# Patient Record
Sex: Male | Born: 1940
Health system: Southern US, Community
[De-identification: ages and names within clinical notes are randomized; demographics above are authoritative.]

## PROBLEM LIST (undated history)

## (undated) DIAGNOSIS — E1169 Type 2 diabetes mellitus with other specified complication: Secondary | ICD-10-CM

## (undated) DIAGNOSIS — E1122 Type 2 diabetes mellitus with diabetic chronic kidney disease: Secondary | ICD-10-CM

## (undated) DIAGNOSIS — E785 Hyperlipidemia, unspecified: Secondary | ICD-10-CM

## (undated) DIAGNOSIS — N182 Chronic kidney disease, stage 2 (mild): Secondary | ICD-10-CM

## (undated) DIAGNOSIS — E119 Type 2 diabetes mellitus without complications: Secondary | ICD-10-CM

## (undated) DIAGNOSIS — M109 Gout, unspecified: Secondary | ICD-10-CM

## (undated) DIAGNOSIS — E559 Vitamin D deficiency, unspecified: Secondary | ICD-10-CM

## (undated) DIAGNOSIS — D126 Benign neoplasm of colon, unspecified: Secondary | ICD-10-CM

## (undated) DIAGNOSIS — I10 Essential (primary) hypertension: Secondary | ICD-10-CM

## (undated) DIAGNOSIS — C61 Malignant neoplasm of prostate: Secondary | ICD-10-CM

## (undated) HISTORY — DX: Malignant neoplasm of prostate: C61

## (undated) HISTORY — DX: Benign neoplasm of colon, unspecified: D12.6

## (undated) HISTORY — DX: Chronic kidney disease, stage 2 (mild): N18.2

## (undated) HISTORY — DX: Type 2 diabetes mellitus without complications: E11.9

## (undated) HISTORY — DX: Type 2 diabetes mellitus with diabetic chronic kidney disease: E11.22

## (undated) HISTORY — DX: Gout, unspecified: M10.9

## (undated) HISTORY — DX: Type 2 diabetes mellitus with other specified complication: E11.69

## (undated) HISTORY — DX: Essential (primary) hypertension: I10

## (undated) HISTORY — DX: Vitamin D deficiency, unspecified: E55.9

## (undated) HISTORY — DX: Hyperlipidemia, unspecified: E78.5

---

## 1992-07-17 HISTORY — PX: APPENDECTOMY: SHX54

## 1992-07-17 HISTORY — PX: CHOLECYSTECTOMY: SHX55

## 1994-07-17 HISTORY — PX: KNEE SURGERY: SHX244

## 1998-12-23 ENCOUNTER — Encounter: Payer: Self-pay | Admitting: Internal Medicine

## 1998-12-23 ENCOUNTER — Ambulatory Visit (HOSPITAL_COMMUNITY): Admission: RE | Admit: 1998-12-23 | Discharge: 1998-12-23 | Payer: Self-pay | Admitting: Internal Medicine

## 1999-03-23 ENCOUNTER — Encounter (INDEPENDENT_AMBULATORY_CARE_PROVIDER_SITE_OTHER): Payer: Self-pay | Admitting: Specialist

## 1999-03-23 ENCOUNTER — Other Ambulatory Visit: Admission: RE | Admit: 1999-03-23 | Discharge: 1999-03-23 | Payer: Self-pay | Admitting: Gastroenterology

## 2002-03-18 ENCOUNTER — Encounter: Payer: Self-pay | Admitting: Gastroenterology

## 2002-03-26 ENCOUNTER — Encounter: Payer: Self-pay | Admitting: Gastroenterology

## 2003-04-22 ENCOUNTER — Encounter: Payer: Self-pay | Admitting: Gastroenterology

## 2006-06-19 ENCOUNTER — Ambulatory Visit (HOSPITAL_COMMUNITY): Admission: RE | Admit: 2006-06-19 | Discharge: 2006-06-19 | Payer: Self-pay | Admitting: Internal Medicine

## 2007-07-24 ENCOUNTER — Encounter: Payer: Self-pay | Admitting: Gastroenterology

## 2007-11-15 ENCOUNTER — Ambulatory Visit: Admission: RE | Admit: 2007-11-15 | Discharge: 2008-02-13 | Payer: Self-pay | Admitting: Radiation Oncology

## 2008-02-14 ENCOUNTER — Ambulatory Visit: Admission: RE | Admit: 2008-02-14 | Discharge: 2008-04-23 | Payer: Self-pay | Admitting: Radiation Oncology

## 2008-07-17 DIAGNOSIS — C61 Malignant neoplasm of prostate: Secondary | ICD-10-CM

## 2008-07-17 HISTORY — DX: Malignant neoplasm of prostate: C61

## 2008-09-08 ENCOUNTER — Encounter: Payer: Self-pay | Admitting: Gastroenterology

## 2009-03-03 ENCOUNTER — Encounter: Payer: Self-pay | Admitting: Gastroenterology

## 2009-05-03 ENCOUNTER — Encounter: Admission: RE | Admit: 2009-05-03 | Discharge: 2009-05-03 | Payer: Self-pay | Admitting: Internal Medicine

## 2009-05-03 ENCOUNTER — Encounter: Payer: Self-pay | Admitting: Gastroenterology

## 2009-05-03 ENCOUNTER — Encounter (INDEPENDENT_AMBULATORY_CARE_PROVIDER_SITE_OTHER): Payer: Self-pay | Admitting: *Deleted

## 2009-05-06 ENCOUNTER — Encounter: Admission: RE | Admit: 2009-05-06 | Discharge: 2009-05-06 | Payer: Self-pay | Admitting: Internal Medicine

## 2009-05-06 ENCOUNTER — Encounter: Payer: Self-pay | Admitting: Gastroenterology

## 2009-06-01 ENCOUNTER — Ambulatory Visit: Payer: Self-pay | Admitting: Gastroenterology

## 2009-06-03 ENCOUNTER — Ambulatory Visit: Payer: Self-pay | Admitting: Gastroenterology

## 2009-06-09 ENCOUNTER — Encounter: Payer: Self-pay | Admitting: Gastroenterology

## 2009-06-24 ENCOUNTER — Encounter: Payer: Self-pay | Admitting: Gastroenterology

## 2009-07-29 ENCOUNTER — Encounter: Payer: Self-pay | Admitting: Gastroenterology

## 2009-09-16 ENCOUNTER — Ambulatory Visit: Payer: Self-pay | Admitting: Gastroenterology

## 2009-09-16 DIAGNOSIS — Z86010 Personal history of colonic polyps: Secondary | ICD-10-CM

## 2009-09-22 ENCOUNTER — Ambulatory Visit: Payer: Self-pay | Admitting: Gastroenterology

## 2009-09-24 ENCOUNTER — Encounter: Payer: Self-pay | Admitting: Gastroenterology

## 2009-10-04 ENCOUNTER — Telehealth: Payer: Self-pay | Admitting: Gastroenterology

## 2009-10-04 ENCOUNTER — Ambulatory Visit: Payer: Self-pay | Admitting: Gastroenterology

## 2009-10-04 LAB — CONVERTED CEMR LAB: Fecal Occult Bld: POSITIVE

## 2009-10-05 ENCOUNTER — Ambulatory Visit: Payer: Self-pay | Admitting: Gastroenterology

## 2009-10-05 LAB — CONVERTED CEMR LAB: OCCULT 4: NEGATIVE

## 2009-10-08 ENCOUNTER — Encounter: Payer: Self-pay | Admitting: Gastroenterology

## 2009-10-13 ENCOUNTER — Ambulatory Visit: Payer: Self-pay | Admitting: Gastroenterology

## 2009-11-10 ENCOUNTER — Telehealth: Payer: Self-pay | Admitting: Gastroenterology

## 2009-11-12 ENCOUNTER — Ambulatory Visit: Payer: Self-pay | Admitting: Gastroenterology

## 2009-11-12 LAB — CONVERTED CEMR LAB
Basophils Relative: 0.3 % (ref 0.0–3.0)
Eosinophils Relative: 3.6 % (ref 0.0–5.0)
HCT: 35.5 % — ABNORMAL LOW (ref 39.0–52.0)
MCV: 78.5 fL (ref 78.0–100.0)
Monocytes Absolute: 0.5 10*3/uL (ref 0.1–1.0)
Neutrophils Relative %: 59.9 % (ref 43.0–77.0)
RBC: 4.52 M/uL (ref 4.22–5.81)
WBC: 5.7 10*3/uL (ref 4.5–10.5)

## 2009-11-15 ENCOUNTER — Ambulatory Visit: Payer: Self-pay | Admitting: Gastroenterology

## 2010-01-13 ENCOUNTER — Encounter: Payer: Self-pay | Admitting: Gastroenterology

## 2010-08-07 ENCOUNTER — Encounter: Payer: Self-pay | Admitting: Internal Medicine

## 2010-08-18 NOTE — Procedures (Signed)
Summary: Instruction for procedure/[facility]  Instruction for procedure/[facility]   Imported By: Sherian Rein 10/07/2009 08:20:25  _____________________________________________________________________  External Attachment:    Type:   Image     Comment:   External Document

## 2010-08-18 NOTE — Assessment & Plan Note (Signed)
Summary: low iron...em   History of Present Illness Visit Type: consult  Primary GI MD: Melvia Heaps MD [facility] Primary Provider: Laneta Simmers, MD Requesting Provider: Lucky Cowboy, MD Chief Complaint: Low iron. Pt c/o muscle pain and shortness of breath  History of Present Illness:   [name] has returned for evaluation of an iron deficiency anemia.  On July 30, 2009 hemoglobin was 11.8 and MCV 78.7.  His percent saturation was 17.  He has no GI complaints of abdominal pain, change in bowel habits, melena or hematochezia.  Colonoscopy in November, 2010 demonstrated resolving diverticulitis and a adenomatous polyp which was removed.  He does take Naprosyn about every other day   GI Review of Systems      Denies abdominal pain, acid reflux, belching, bloating, chest pain, dysphagia with liquids, dysphagia with solids, heartburn, loss of appetite, nausea, vomiting, vomiting blood, weight loss, and  weight gain.        Denies anal fissure, black tarry stools, change in bowel habit, constipation, diarrhea, diverticulosis, fecal incontinence, heme positive stool, hemorrhoids, irritable bowel syndrome, jaundice, light color stool, liver problems, rectal bleeding, and  rectal pain.    Current Medications (verified): 1)  Atenolol 100 Mg Tabs (Atenolol) .Marland Kitchen.. 1 By Mouth Once Daily 2)  Allopurinol 300 Mg Tabs (Allopurinol) .... 1/2 By Mouth Once Daily 3)  Doxazosin Mesylate 8 Mg Tabs (Doxazosin Mesylate) .... 1/2 By Mouth Once Daily 4)  Metformin Hcl 500 Mg Tabs (Metformin Hcl) .... Take One By Mouth Two Times A Day 5)  Crestor 40 Mg Tabs (Rosuvastatin Calcium) .... One Tablet By Mouth Once Daily 6)  Bumetanide 2 Mg Tabs (Bumetanide) .... Take One By Mouth Once Daily 7)  Naprosyn 500 Mg Tabs (Naproxen) .... Take Two By Mouth As Needed 8)  Alprazolam 1 Mg Tabs (Alprazolam) .... Take 1/2 Tablet Three Times A Day As Needed 9)  Vitamin D3 10000 Unit Caps (Cholecalciferol) .... Take One By  Mouth Once Daily 10)  Fish Oil 300 Mg Caps (Omega-3 Fatty Acids) .... Take One By Mouth Once Daily 11)  Cal/mag Citrate 250-125 Mg Tabs (Calcium-Magnesium) .... Take One By Mouth Once Daily  Allergies (verified): No Known Drug Allergies  Past History:  Past Medical History: Reviewed history from 06/01/2009 and no changes required. GERD Hypertension chronic venous insufficiency DJD gout Obesity obstructive BPH Hyperlipidemia Hx of Colon Polyps  Diverticulosis  2003 Hx prostate cancer  Past Surgical History: Reviewed history from 05/26/2009 and no changes required. Appendectomy 1994 Cholecystectomy 1994 bilateral quadriceps tendon repair  Family History: Reviewed history from 06/01/2009 and no changes required. Father died age 12 Fever of Unknown origin Mother died at age 67 with Rhuematic Heart Disease Neice cancer unknown type No FH of Colon Cancer Family History of Diabetes: brother  Social History: Retired divorced Patient is a former smoker.  Alcohol Use - no Illicit Drug Use - no  Review of Systems       The patient complains of muscle pains/cramps and shortness of breath.  The patient denies allergy/sinus, anemia, anxiety-new, arthritis/joint pain, back pain, blood in urine, breast changes/lumps, change in vision, confusion, cough, coughing up blood, depression-new, fainting, fatigue, fever, headaches-new, hearing problems, heart murmur, heart rhythm changes, itching, night sweats, nosebleeds, skin rash, sleeping problems, sore throat, swelling of feet/legs, swollen lymph glands, thirst - excessive, urination - excessive, urination changes/pain, urine leakage, vision changes, and voice change.    Vital Signs:  Patient profile:   70 year old male Height:  73.5 inches Weight:      311 pounds BMI:     40.62 BSA:     2.61 Pulse rate:   68 / minute Pulse rhythm:   regular BP sitting:   124 / 72  (left arm) Cuff size:   regular  Vitals Entered By: Ok Anis CMA (September 16, 2009 9:03 AM)   Impression & Recommendations:  Problem # 1:  IRON DEFICIENCY (ICD-280.9)  His iron deficiency anemia may be related to his NSAID use causing erosions or frank ulcers.  Blood loss from bleeding AVMs is another consideration.  A colonic bleeding source is very unlikely in the face of his recent colonoscopy.  Recommendations #1 serial Hemoccults #2 upper endoscopy  Orders: EGD (EGD)  Problem # 2:  PERSONAL HISTORY OF COLONIC POLYPS (ICD-V12.72) Plan followup colonoscopy in 5 years  Patient Instructions: 1)  cc Lucky Cowboy, M.D. 2)  Your EGD is scheduled for 09/22/2009 at 10am 3)  You need to go to the basement today for labs 4)  The medication list was reviewed and reconciled.  All changed / newly prescribed medications were explained.  A complete medication list was provided to the patient / caregiver.

## 2010-08-18 NOTE — Letter (Signed)
Summary: Results Letter  [facility]  [address] [place], [place] [address]   Phone: [phone]  Fax: [phone]        October 05, 2009 MRN: [id number]    Marcus Chambers [address] [place], [place]  [address]    Dear Mr. Dantes,  Your hemeoccult cards testing for microscopic bleeding were negative.  No further GI workup is required at this time.   Please continue with the recommendations that we previously discussed. Should you have any further questions or concerns, feel free to contact me.    Sincerely,  Barbette Hair. Arlyce Dice, M.D., [facility]  cc Lucky Cowboy, M.D.         Sincerely,  Louis Meckel MD  This letter has been electronically signed by your physician.  Appended Document: Results Letter letter mailed

## 2010-08-18 NOTE — Letter (Signed)
Summary: [facility]: Immunoassay Fecal Occult Blood (iFOB) Order [facility]  [address] [place], [place] [address]   Phone: [phone]  Fax: [phone]      [facility]: Immunoassay Fecal Occult Blood (iFOB) Order Form   September 16, 2009 MRN: [id number]   Marcus Chambers 08-Aug-1940   Physicican Name:robert kaplan Diagnosis Code:280.9  anemia      [name]

## 2010-08-18 NOTE — Progress Notes (Signed)
Summary: Capsule results  Phone Note Call from Patient Call back at Home Phone [phone] Call back at [phone]   Caller: Patient Summary of Call: Patient had capsule endo on 10/13/2009 but has not heard about the results.  Initial call taken by: Harlow Mares CMA Duncan Dull),  November 10, 2009 8:54 AM  Follow-up for Phone Call        No abnormalities.  Check with Amy for recommendations Follow-up by: Louis Meckel MD,  November 10, 2009 9:54 AM  Additional Follow-up for Phone Call Additional follow up Details #1::        Per Amy Esterwood PAC: Pt. needs a CBC and OV to follow-up.  Pt. will have labs drawn 11-12-09 and see Dr.Kaplan on 11-15-09 at 2:45pm. Pt. instructed to call back as needed.  Additional Follow-up by: Laureen Ochs LPN,  November 10, 2009 3:00 PM

## 2010-08-18 NOTE — Letter (Signed)
Summary: Diabetic Instructions   Gastroenterology  [address] [place], [place] [address]   Phone: [phone]  Fax: [phone]    Marcus Chambers 1941/06/18 MRN: [id number]   X    ORAL DIABETIC MEDICATION INSTRUCTIONS  (Metformin)  The day before your procedure:   Take your diabetic pill as you do normally  The day of your procedure:   Do not take your diabetic pill    We will check your blood sugar levels during the admission process and again in Recovery before discharging you home  ________________________________________________________________________  _  _   INSULIN (LONG ACTING) MEDICATION INSTRUCTIONS (Lantus, NPH, 70/30, Humulin, Novolin-N)   The day before your procedure:   Take  your regular evening dose    The day of your procedure:   Do not take your morning dose    _  _   INSULIN (SHORT ACTING) MEDICATION INSTRUCTIONS (Regular, Humulog, Novolog)   The day before your procedure:   Do not take your evening dose   The day of your procedure:   Do not take your morning dose   _  _   INSULIN PUMP MEDICATION INSTRUCTIONS  We will contact the physician managing your diabetic care for written dosage instructions for the day before your procedure and the day of your procedure.  Once we have received the instructions, we will contact you.

## 2010-08-18 NOTE — Letter (Signed)
Summary: EGD Instructions  [facility]  [address] [place], [place] [address]   Phone: [phone]  Fax: [phone]       Marcus Chambers    09-15-1940    MRN: [id number]       Procedure Day /Date:WEDNESDAY 09/22/2009     Arrival Time: 9AM     Procedure Time:10AM     Location of Procedure:                    X  [facility] (4th Floor)   PREPARATION FOR ENDOSCOPY   On 09/22/2009  THE DAY OF THE PROCEDURE:  1.   No solid foods, milk or milk products are allowed after midnight the night before your procedure.  2.   Do not drink anything colored red or purple.  Avoid juices with pulp.  No orange juice.  3.  You may drink clear liquids until 8AM , which is 2 hours before your procedure.                                                                                                CLEAR LIQUIDS INCLUDE: Water Jello Ice Popsicles Tea (sugar ok, no milk/cream) Powdered fruit flavored drinks Coffee (sugar ok, no milk/cream) Gatorade Juice: apple, white grape, white cranberry  Lemonade Clear bullion, consomm, broth Carbonated beverages (any kind) Strained chicken noodle soup Hard Candy   MEDICATION INSTRUCTIONS  Unless otherwise instructed, you should take regular prescription medications with a small sip of water as early as possible the morning of your procedure.            OTHER INSTRUCTIONS  You will need a responsible adult at least 70 years of age to accompany you and drive you home.   This person must remain in the waiting room during your procedure.  Wear loose fitting clothing that is easily removed.  Leave jewelry and other valuables at home.  However, you may wish to bring a book to read or an iPod/MP3 player to listen to music as you wait for your procedure to start.  Remove all body piercing jewelry and leave at home.  Total time from sign-in until discharge is approximately 2-3 hours.  You should go home directly after  your procedure and rest.  You can resume normal activities the day after your procedure.  The day of your procedure you should not:   Drive   Make legal decisions   Operate machinery   Drink alcohol   Return to work  You will receive specific instructions about eating, activities and medications before you leave.    The above instructions have been reviewed and explained to me by   _______________________    I fully understand and can verbalize these instructions _____________________________ Date _________

## 2010-08-18 NOTE — Progress Notes (Signed)
Summary: Capsule Endo. Scheduled  Phone Note Outgoing Call   Call placed by: Laureen Ochs LPN,  October 04, 2009 4:25 PM Call placed to: Patient Summary of Call: Occult blood results/per Dr.Kaplan--Pt. needs a Capsule Endoscopy. Pt. has scheduled the appt. for 10-13-09 at 8am. Instructions reviewed with Mr.Clayborn by phone and mailed to him. Pt. instructed to call back as needed.   Initial call taken by: Laureen Ochs LPN,  October 04, 2009 4:26 PM  New Problems: HEMOCCULT POSITIVE STOOL (ICD-578.1) IRON DEFICIENCY ANEMIA SECONDARY TO BLOOD LOSS (ICD-280.0)   New Problems: HEMOCCULT POSITIVE STOOL (ICD-578.1) IRON DEFICIENCY ANEMIA SECONDARY TO BLOOD LOSS (ICD-280.0)

## 2010-08-18 NOTE — Miscellaneous (Signed)
Summary: Capsule Endoscopy Scheduling Form/Marcus Chambers  Capsule Endoscopy Scheduling Form/Marcus Chambers   Imported By: Sherian Rein 10/19/2009 08:12:44  _____________________________________________________________________  External Attachment:    Type:   Image     Comment:   External Document

## 2010-08-18 NOTE — Letter (Signed)
Summary: Results Letter  [facility]  [address] [place], [place] [address]   Phone: [phone]  Fax: [phone]        September 24, 2009 MRN: [id number]    [name] [address] [place], [place]  [address]    Dear Mr. Natzke,  Your biopsy results did not show any remarkable findings.  Please continue with the recommendations previously discussed. No further GI workup is required at this time.  Should you have any further questions or immediate concers, feel free to contact me.  Sincerely,  Barbette Hair. Arlyce Dice, M.D., [facility]          Sincerely,  Louis Meckel MD  This letter has been electronically signed by your physician.  Appended Document: Results Letter Letter mailed 3.15.11

## 2010-08-18 NOTE — Procedures (Signed)
Summary: IRON DEF. ANEMIA, BLOOD IN [facility]  Patient here today for capsule endoscopy for Dr. Arlyce Dice .  Pt verbalized understanding of all verbal and written instructions.  Pt tolerated well.  Lot #  [id number]  exp _2012-05 .

## 2010-08-18 NOTE — Assessment & Plan Note (Signed)
Summary: POST CAPSULE ENDO. & LABS          Marcus Chambers   History of Present Illness Visit Type: Follow-up Visit Primary GI MD: Melvia Heaps MD [facility] Primary Provider: Laneta Simmers, MD Requesting Provider: n/a Chief Complaint: F/u for capsule endo and labs. Pt states that he is fine and denies any GI complaints   History of Present Illness:   Marcus Chambers has returned for followup of his anemia.  Upper endoscopy and capsule endoscopy were not diagnostic for a GI bleeding source.  A single Hemoccult was positive in March, 2011.  Followup Hemoccults several days later were negative x6.  He does take Naprosyn about twice a week.  His last hemoglobin was 11.6 on April 29 which is stable.  MCV remains low at 78.5.  He has no GI complaints.   GI Review of Systems      Denies abdominal pain, acid reflux, belching, bloating, chest pain, dysphagia with liquids, dysphagia with solids, heartburn, loss of appetite, nausea, vomiting, vomiting blood, weight loss, and  weight gain.        Denies anal fissure, black tarry stools, change in bowel habit, constipation, diarrhea, diverticulosis, fecal incontinence, heme positive stool, hemorrhoids, irritable bowel syndrome, jaundice, light color stool, liver problems, rectal bleeding, and  rectal pain.    Current Medications (verified): 1)  Atenolol 100 Mg Tabs (Atenolol) .Marland Kitchen.. 1 By Mouth Once Daily 2)  Allopurinol 300 Mg Tabs (Allopurinol) .... 1/2 By Mouth Once Daily 3)  Doxazosin Mesylate 8 Mg Tabs (Doxazosin Mesylate) .... 1/2 By Mouth Once Daily 4)  Metformin Hcl 500 Mg Tabs (Metformin Hcl) .... Take One By Mouth Two Times A Day 5)  Bumetanide 2 Mg Tabs (Bumetanide) .... Take One By Mouth Once Daily 6)  Naprosyn 500 Mg Tabs (Naproxen) .... Take Two By Mouth As Needed 7)  Alprazolam 1 Mg Tabs (Alprazolam) .... Take 1/2 Tablet Three Times A Day As Needed 8)  Vitamin D3 10000 Unit Caps (Cholecalciferol) .... Take One By Mouth Once Daily 9)  Fish Oil 300 Mg  Caps (Omega-3 Fatty Acids) .... Take One By Mouth Once Daily 10)  Pravastatin Sodium 40 Mg Tabs (Pravastatin Sodium) .... One Tablet By Mouth Once Daily  Allergies (verified): No Known Drug Allergies  Past History:  Past Medical History: Reviewed history from 06/01/2009 and no changes required. GERD Hypertension chronic venous insufficiency DJD gout Obesity obstructive BPH Hyperlipidemia Hx of Colon Polyps  Diverticulosis  2003 Hx prostate cancer  Past Surgical History: Reviewed history from 05/26/2009 and no changes required. Appendectomy 1994 Cholecystectomy 1994 bilateral quadriceps tendon repair  Family History: Reviewed history from 06/01/2009 and no changes required. Father died age 42 Fever of Unknown origin Mother died at age 81 with Rhuematic Heart Disease Neice cancer unknown type No FH of Colon Cancer Family History of Diabetes: Brother  Social History: Reviewed history from 09/16/2009 and no changes required. Retired divorced Patient is a former smoker.  Alcohol Use - no Illicit Drug Use - no  Review of Systems       The patient complains of arthritis/joint pain.  The patient denies allergy/sinus, anemia, anxiety-new, back pain, blood in urine, breast changes/lumps, change in vision, confusion, cough, coughing up blood, depression-new, fainting, fatigue, fever, headaches-new, hearing problems, heart murmur, heart rhythm changes, itching, muscle pains/cramps, night sweats, nosebleeds, shortness of breath, skin rash, sleeping problems, sore throat, swelling of feet/legs, swollen lymph glands, thirst - excessive, urination - excessive, urination changes/pain, urine leakage, vision changes, and  voice change.    Vital Signs:  Patient profile:   70 year old male Height:      73.5 inches Weight:      307 pounds BMI:     40.10 BSA:     2.60 Pulse rate:   64 / minute Pulse rhythm:   regular BP sitting:   126 / 78  (left arm) Cuff size:   regular  Vitals  Entered By: Ok Anis CMA (Nov 15, 2009 2:43 PM)   Impression & Recommendations:  Problem # 1:  IRON DEFICIENCY ANEMIA SECONDARY TO BLOOD LOSS (ICD-280.0) This may be related to his NSAID use.  No other obvious lesions were identified by endoscopy, capsule endoscopy or colonoscopy.  Recommendations #1 iron supplementation for one month #2 no further GI studies at this time  #3 followup CBC in approximately a month.  He will get that at Dr. Kathryne Sharper office  Patient Instructions: 1)  Copy sent to : William Mckowen,MD 2)  You can pick up your Iron prescription from your pharmacy today 3)  The medication list was reviewed and reconciled.  All changed / newly prescribed medications were explained.  A complete medication list was provided to the patient / caregiver. Prescriptions: FEOSOL 200 (65 FE) MG TABS (FERROUS SULFATE DRIED) take one tab twice a day  #60 x 0   Entered and Authorized by:   Louis Meckel MD   Signed by:   Louis Meckel MD on 11/15/2009   Method used:   Electronically to        [facility] (retail)       [address]       [place], [place]  [address]       Ph: [phone]       Fax: [phone]   RxID:   [id number]

## 2010-08-18 NOTE — Procedures (Signed)
Summary: Capsule Endoscopy   Capsule Endoscopy  Procedure date:  10/13/2009  Findings:      Performing Location: [facility]   Ordering Physician: Melvia Heaps, MD  Report created/read by: Melvia Heaps, MD  Reason for Referral:  70 y/o male with Fe deficiency anemia.  Recent EGD and colonoscopy unrevealing as to source  Procedure Information and Findings:  1) Complete study, good prep 2) Few tiny red spots, ? AVMs 3) lymphoid hyperplasia in distal illeum  Summary and Recommendations:  No definite findings to explain iron deficiency, other than possible tiny AVM's.    This report was created from the original report, which was reviewed and signed by the above listed reading physician.

## 2010-08-18 NOTE — Procedures (Signed)
Summary: Upper Endoscopy  Patient: Marcus Chambers Note: All result statuses are Final unless otherwise noted.  Tests: (1) Upper Endoscopy (EGD)   EGD Upper Endoscopy       DONE     [facility]     [address]     [place], [place]  [address]           ENDOSCOPY PROCEDURE REPORT           PATIENT:  Marcus Chambers, Marcus Chambers  MR#:  [id number]     BIRTHDATE:  09-23-1940, 68 yrs. old  GENDER:  male           ENDOSCOPIST:  Barbette Hair. Arlyce Dice, MD     Referred by:           PROCEDURE DATE:  09/22/2009     PROCEDURE:  EGD with biopsy     ASA CLASS:  Class II     INDICATIONS:  iron deficiency anemia           MEDICATIONS:   Fentanyl 100 mcg IV, Versed 9 mg IV, glycopyrrolate     (Robinal) 0.2 mg IV, 0.6cc simethancone 0.6 cc PO     TOPICAL ANESTHETIC:  Exactacain Spray           DESCRIPTION OF PROCEDURE:   After the risks benefits and     alternatives of the procedure were thoroughly explained, informed     consent was obtained.  The LB [id number] endoscope was     introduced through the mouth and advanced to the third portion of     the duodenum, without limitations.  The instrument was slowly     withdrawn as the mucosa was fully examined.     <<PROCEDUREIMAGES>>           Mild gastritis was found in the body of the stomach. Mild     nonerosive gastritis. Bxs taken (see image6).  Otherwise the     examination was normal (see image1, image2, image3, image4,     image5, image7, image10, and image11).    Retroflexed views     revealed no abnormalities.    The scope was then withdrawn from     the patient and the procedure completed.           COMPLICATIONS:  None           ENDOSCOPIC IMPRESSION:     1) Mild gastritis in the body of the stomach     2) Otherwise normal examination           Gastritis probably is secondary to naprosyn use.  No source for GI     bleeding identified.           RECOMMENDATIONS:     1) hemmoccult stools in 1 week; if negative, no further GI  workup           REPEAT EXAM:  No           ______________________________     Barbette Hair. Arlyce Dice, MD           CC:  Lucky Cowboy, MD           n.     Rosalie Doctor:   Barbette Hair. Kaplan at 09/22/2009 11:00 AM           Charissa Bash, [phone]  Note: An exclamation mark (!) indicates a result that was not dispersed into the flowsheet. Document Creation Date: 09/22/2009 11:00 AM _______________________________________________________________________  (1) Order result  status: Final Collection or observation date-time: 09/22/2009 10:55 Requested date-time:  Receipt date-time:  Reported date-time:  Referring Physician:   Ordering Physician: Melvia Heaps [phone]) Specimen Source:  Source: Launa Grill Order Number: [id number] Lab site:

## 2010-10-07 ENCOUNTER — Other Ambulatory Visit: Payer: Self-pay | Admitting: Surgery

## 2010-10-07 DIAGNOSIS — L988 Other specified disorders of the skin and subcutaneous tissue: Secondary | ICD-10-CM

## 2010-10-14 ENCOUNTER — Ambulatory Visit
Admission: RE | Admit: 2010-10-14 | Discharge: 2010-10-14 | Disposition: A | Discharge status: Home / Self Care | Payer: Medicare Other | Source: Ambulatory Visit | Attending: Surgery | Admitting: Surgery

## 2010-10-14 ENCOUNTER — Other Ambulatory Visit: Payer: Self-pay | Admitting: Surgery

## 2010-10-14 DIAGNOSIS — L988 Other specified disorders of the skin and subcutaneous tissue: Secondary | ICD-10-CM

## 2010-10-19 LAB — GLUCOSE, CAPILLARY: Glucose-Capillary: 161 mg/dL — ABNORMAL HIGH (ref 70–99)

## 2010-11-15 ENCOUNTER — Ambulatory Visit (HOSPITAL_COMMUNITY)
Admission: RE | Admit: 2010-11-15 | Discharge: 2010-11-15 | Disposition: A | Discharge status: Home / Self Care | Payer: Medicare Other | Source: Ambulatory Visit | Attending: Surgery | Admitting: Surgery

## 2010-11-15 ENCOUNTER — Other Ambulatory Visit (HOSPITAL_COMMUNITY): Payer: Self-pay | Admitting: Surgery

## 2010-11-15 ENCOUNTER — Encounter (HOSPITAL_COMMUNITY): Payer: Medicare Other

## 2010-11-15 ENCOUNTER — Other Ambulatory Visit: Payer: Self-pay | Admitting: Surgery

## 2010-11-15 DIAGNOSIS — Z01812 Encounter for preprocedural laboratory examination: Secondary | ICD-10-CM | POA: Insufficient documentation

## 2010-11-15 DIAGNOSIS — Z01818 Encounter for other preprocedural examination: Secondary | ICD-10-CM | POA: Insufficient documentation

## 2010-11-15 DIAGNOSIS — N321 Vesicointestinal fistula: Secondary | ICD-10-CM | POA: Insufficient documentation

## 2010-11-15 LAB — CBC
HCT: 41.3 % (ref 39.0–52.0)
Hemoglobin: 13.2 g/dL (ref 13.0–17.0)
MCHC: 32 g/dL (ref 30.0–36.0)
MCV: 81 fL (ref 78.0–100.0)

## 2010-11-15 LAB — BASIC METABOLIC PANEL
CO2: 26 mEq/L (ref 19–32)
Calcium: 10.3 mg/dL (ref 8.4–10.5)
Glucose, Bld: 104 mg/dL — ABNORMAL HIGH (ref 70–99)
Sodium: 139 mEq/L (ref 135–145)

## 2010-11-21 ENCOUNTER — Encounter (INDEPENDENT_AMBULATORY_CARE_PROVIDER_SITE_OTHER): Payer: Self-pay | Admitting: Surgery

## 2010-11-23 ENCOUNTER — Inpatient Hospital Stay (HOSPITAL_COMMUNITY)
Admission: RE | Admit: 2010-11-23 | Discharge: 2010-12-07 | DRG: 653 | Disposition: A | Discharge status: Home / Self Care | Payer: Medicare Other | Source: Ambulatory Visit | Attending: Surgery | Admitting: Surgery

## 2010-11-23 ENCOUNTER — Inpatient Hospital Stay (HOSPITAL_COMMUNITY): Payer: Medicare Other

## 2010-11-23 ENCOUNTER — Other Ambulatory Visit: Payer: Self-pay | Admitting: Surgery

## 2010-11-23 DIAGNOSIS — I10 Essential (primary) hypertension: Secondary | ICD-10-CM | POA: Diagnosis present

## 2010-11-23 DIAGNOSIS — I4949 Other premature depolarization: Secondary | ICD-10-CM | POA: Diagnosis not present

## 2010-11-23 DIAGNOSIS — Z01818 Encounter for other preprocedural examination: Secondary | ICD-10-CM

## 2010-11-23 DIAGNOSIS — Y832 Surgical operation with anastomosis, bypass or graft as the cause of abnormal reaction of the patient, or of later complication, without mention of misadventure at the time of the procedure: Secondary | ICD-10-CM | POA: Diagnosis not present

## 2010-11-23 DIAGNOSIS — I428 Other cardiomyopathies: Secondary | ICD-10-CM | POA: Diagnosis present

## 2010-11-23 DIAGNOSIS — Z79899 Other long term (current) drug therapy: Secondary | ICD-10-CM

## 2010-11-23 DIAGNOSIS — K651 Peritoneal abscess: Secondary | ICD-10-CM | POA: Diagnosis present

## 2010-11-23 DIAGNOSIS — Z01812 Encounter for preprocedural laboratory examination: Secondary | ICD-10-CM

## 2010-11-23 DIAGNOSIS — I498 Other specified cardiac arrhythmias: Secondary | ICD-10-CM | POA: Diagnosis not present

## 2010-11-23 DIAGNOSIS — F29 Unspecified psychosis not due to a substance or known physiological condition: Secondary | ICD-10-CM | POA: Diagnosis not present

## 2010-11-23 DIAGNOSIS — Z5331 Laparoscopic surgical procedure converted to open procedure: Secondary | ICD-10-CM

## 2010-11-23 DIAGNOSIS — M109 Gout, unspecified: Secondary | ICD-10-CM | POA: Diagnosis present

## 2010-11-23 DIAGNOSIS — E119 Type 2 diabetes mellitus without complications: Secondary | ICD-10-CM | POA: Diagnosis present

## 2010-11-23 DIAGNOSIS — Z7982 Long term (current) use of aspirin: Secondary | ICD-10-CM

## 2010-11-23 DIAGNOSIS — Z923 Personal history of irradiation: Secondary | ICD-10-CM

## 2010-11-23 DIAGNOSIS — N281 Cyst of kidney, acquired: Secondary | ICD-10-CM | POA: Diagnosis present

## 2010-11-23 DIAGNOSIS — I2789 Other specified pulmonary heart diseases: Secondary | ICD-10-CM | POA: Diagnosis present

## 2010-11-23 DIAGNOSIS — Z8546 Personal history of malignant neoplasm of prostate: Secondary | ICD-10-CM

## 2010-11-23 DIAGNOSIS — K5730 Diverticulosis of large intestine without perforation or abscess without bleeding: Secondary | ICD-10-CM | POA: Diagnosis present

## 2010-11-23 DIAGNOSIS — Q8909 Congenital malformations of spleen: Secondary | ICD-10-CM

## 2010-11-23 DIAGNOSIS — I4891 Unspecified atrial fibrillation: Secondary | ICD-10-CM | POA: Diagnosis not present

## 2010-11-23 DIAGNOSIS — Y9210 Unspecified residential institution as the place of occurrence of the external cause: Secondary | ICD-10-CM | POA: Diagnosis not present

## 2010-11-23 DIAGNOSIS — E785 Hyperlipidemia, unspecified: Secondary | ICD-10-CM | POA: Diagnosis present

## 2010-11-23 DIAGNOSIS — I519 Heart disease, unspecified: Secondary | ICD-10-CM | POA: Diagnosis not present

## 2010-11-23 DIAGNOSIS — N321 Vesicointestinal fistula: Principal | ICD-10-CM | POA: Diagnosis present

## 2010-11-23 LAB — GLUCOSE, CAPILLARY
Glucose-Capillary: 139 mg/dL — ABNORMAL HIGH (ref 70–99)
Glucose-Capillary: 134 mg/dL — ABNORMAL HIGH (ref 70–99)

## 2010-11-23 LAB — ABO/RH: ABO/RH(D): B POS

## 2010-11-23 LAB — TYPE AND SCREEN
ABO/RH(D): B POS
Antibody Screen: NEGATIVE

## 2010-11-24 DIAGNOSIS — I4949 Other premature depolarization: Secondary | ICD-10-CM

## 2010-11-24 LAB — GLUCOSE, CAPILLARY
Glucose-Capillary: 109 mg/dL — ABNORMAL HIGH (ref 70–99)
Glucose-Capillary: 104 mg/dL — ABNORMAL HIGH (ref 70–99)
Glucose-Capillary: 123 mg/dL — ABNORMAL HIGH (ref 70–99)
Glucose-Capillary: 107 mg/dL — ABNORMAL HIGH (ref 70–99)

## 2010-11-24 LAB — BASIC METABOLIC PANEL
BUN: 9 mg/dL (ref 6–23)
CO2: 26 mEq/L (ref 19–32)
Chloride: 102 mEq/L (ref 96–112)
GFR calc non Af Amer: >60 mL/min (ref >60)
Glucose, Bld: 103 mg/dL — ABNORMAL HIGH (ref 70–99)
Potassium: 4 mEq/L (ref 3.5–5.1)
Sodium: 136 mEq/L (ref 135–145)

## 2010-11-24 LAB — HEMOGLOBIN A1C: Hgb A1c MFr Bld: 5.9 % — ABNORMAL HIGH (ref <5.7)

## 2010-11-24 LAB — CBC
HCT: 39.9 % (ref 39.0–52.0)
Hemoglobin: 12.6 g/dL — ABNORMAL LOW (ref 13.0–17.0)
MCV: 81.3 fL (ref 78.0–100.0)
RBC: 4.91 MIL/uL (ref 4.22–5.81)
WBC: 10.7 10*3/uL — ABNORMAL HIGH (ref 4.0–10.5)

## 2010-11-25 LAB — CBC
HCT: 36.9 % — ABNORMAL LOW (ref 39.0–52.0)
Hemoglobin: 11.6 g/dL — ABNORMAL LOW (ref 13.0–17.0)
MCHC: 31.4 g/dL (ref 30.0–36.0)
RDW: 14.5 % (ref 11.5–15.5)
WBC: 10.4 10*3/uL (ref 4.0–10.5)

## 2010-11-25 LAB — GLUCOSE, CAPILLARY
Glucose-Capillary: 109 mg/dL — ABNORMAL HIGH (ref 70–99)
Glucose-Capillary: 115 mg/dL — ABNORMAL HIGH (ref 70–99)

## 2010-11-25 LAB — BASIC METABOLIC PANEL
Calcium: 9.1 mg/dL (ref 8.4–10.5)
GFR calc Af Amer: >60 mL/min (ref >60)
GFR calc non Af Amer: >60 mL/min (ref >60)
Potassium: 4.2 mEq/L (ref 3.5–5.1)
Sodium: 137 mEq/L (ref 135–145)

## 2010-11-25 LAB — CARDIAC PANEL(CRET KIN+CKTOT+MB+TROPI)
Total CK: 455 U/L — ABNORMAL HIGH (ref 7–232)
Troponin I: <0.3 ng/mL (ref <0.30)

## 2010-11-25 NOTE — Op Note (Signed)
NAMEJAREK, Marcus Chambers                ACCOUNT NO.:  [id number]  MEDICAL RECORD NO.:  [id number]           PATIENT TYPE:  I  LOCATION:  1232                         FACILITY:  [facility]  PHYSICIAN:  [name], MD  DATE OF BIRTH:  22-May-1941  DATE OF PROCEDURE: DATE OF DISCHARGE:                              OPERATIVE REPORT   PREOPERATIVE DIAGNOSIS:  History of abdominal pain and colovesical fistula.  POSTOPERATIVE DIAGNOSES:  Performed diverticular abscess with involvement of small intestine, colovesical fistula, dense inflammatory mass, large left renal cyst.  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 Barron Alvine, placement of a wound VAC for the incision and also JP in the pelvis.  SURGEON:  [name], MD  ASSISTANTS: 1. Lennie Muckle, MD 2. Valetta Ransdell, M.D.  DESCRIPTION OF PROCEDURE:  This 70 year old African-American male was taken to room #1 on Wednesday, Nov 23, 2010.  First Dr. Isabel Caprice placed stents in ureters.  He was then left in the lithotomy position for what became a 6-hour operation.  As he is morbidly obese man, probably has lost 100 pounds, his abdomen has got lot of lax tissue.  I entered the abdomen through the left upper quadrant using a 0-degree Optiview and then insufflated and found lot of adhesions where he had his open gallbladder done.  I placed multiple trocars to give me access to the pelvis and descending colon.  I began working from the right lower portion up  towards the splenic flexure.  I was able to mobilize his left colon towards the midline and the splenic flexure.  Of note, on the top I found an accessory splenic mass, about round, probably couple of centimeters in diameter.  I also appreciated his left renal cyst and Dr. Isabel Caprice to look at that and he felt it was something we could just watch.  Down the pelvis, this was rock-hard and was not mobilizing easily.  I converted to a hand port in the midline and with combination laparoscopy and hand port, I began again try to mobilize the sigmoid colon and found it to be really stuck in and stuck down involving small bowel.  I went ahead and made a laparotomy incision that was limited because of my ability to mobilize the splenic flexure but it was necessary to really get in herein and see things.  The ureters were later identified with Dr. Ellin Goodie help, we stayed away from those.  The small bowel was taken away from this area and as there was one area that I needed to resect I did resect that area where the bowel was involved and more likely afistula was forming.  Another area trimmed and closed primarily with silks.  The colon area itself was divided proximally and distally with contour stapler.  Subsequently I marked the proximal end with a suture to help identify its orientation.  We divided the mesentery and sent the specimen off the table.  Dr. Isabel Caprice came in and repaired the bladder and I assisted that.  I then cleaned up the ends and constructed an end-to-end descending colon to distal sigmoid anastomosis, hand sewn, 2 layers, with 3-0 silk and 4-0 PDS.  We then clamped above that, I inflated from below and there was no evidence of any bubbles or leaks.  We irrigated and bleeding was controlled.  The mesenteric bleeders were oversewn with 2-0 silks.  A JP drain was brought through one the 5 mm trocar sites from the right side, placed in the pelvis.  The abdomen was closed with  running 2-0 Vicryl and the peritoneal layer from below and then interrupted #1 Novafil.  There was very deep fascia.  Fatty layer was irrigated well and then closed with staples and then the wound incisional VAC was placed on the incision.  As I said before, I tested the anastomosis with inflating it.  We ran the small bowel and no other enterotomies were noted.  Everything looked to be in order.  Estimated blood loss was around 700 cc by anesthesia.  My instruments were probably less than that but I will go with that.  The patient was taken to the recovery room and will go to step-down unit postop.     Thornton Park Daphine Deutscher, MD     MBM/MEDQ  D:  11/23/2010  T:  11/23/2010  Job:  [id number]  cc:   Lucky Cowboy, M.D. Fax: [phone]  Electronically Signed by Luretha Murphy MD on 11/25/2010 08:36:49 AM

## 2010-11-26 LAB — GLUCOSE, CAPILLARY
Glucose-Capillary: 158 mg/dL — ABNORMAL HIGH (ref 70–99)
Glucose-Capillary: 112 mg/dL — ABNORMAL HIGH (ref 70–99)

## 2010-11-26 LAB — DIFFERENTIAL
Basophils Relative: 0 % (ref 0–1)
Eosinophils Absolute: 0 10*3/uL (ref 0.0–0.7)
Monocytes Absolute: 0.7 10*3/uL (ref 0.1–1.0)
Monocytes Relative: 6 % (ref 3–12)

## 2010-11-26 LAB — CBC
Hemoglobin: 10.9 g/dL — ABNORMAL LOW (ref 13.0–17.0)
MCH: 26 pg (ref 26.0–34.0)
MCHC: 31.6 g/dL (ref 30.0–36.0)
Platelets: 216 10*3/uL (ref 150–400)

## 2010-11-26 LAB — BASIC METABOLIC PANEL
CO2: 22 mEq/L (ref 19–32)
Calcium: 8.9 mg/dL (ref 8.4–10.5)
Creatinine, Ser: 0.92 mg/dL (ref 0.4–1.5)
GFR calc Af Amer: >60 mL/min (ref >60)

## 2010-11-26 LAB — CARDIAC PANEL(CRET KIN+CKTOT+MB+TROPI)
Relative Index: 0.8 (ref 0.0–2.5)
Total CK: 384 U/L — ABNORMAL HIGH (ref 7–232)
Troponin I: <0.3 ng/mL (ref <0.30)

## 2010-11-27 LAB — CBC
HCT: 33.6 % — ABNORMAL LOW (ref 39.0–52.0)
Hemoglobin: 10.7 g/dL — ABNORMAL LOW (ref 13.0–17.0)
MCH: 26 pg (ref 26.0–34.0)
MCV: 81.8 fL (ref 78.0–100.0)
RBC: 4.11 MIL/uL — ABNORMAL LOW (ref 4.22–5.81)

## 2010-11-27 LAB — BASIC METABOLIC PANEL
CO2: 23 mEq/L (ref 19–32)
Chloride: 101 mEq/L (ref 96–112)
GFR calc Af Amer: >60 mL/min (ref >60)
Sodium: 131 mEq/L — ABNORMAL LOW (ref 135–145)

## 2010-11-27 LAB — GLUCOSE, CAPILLARY
Glucose-Capillary: 118 mg/dL — ABNORMAL HIGH (ref 70–99)
Glucose-Capillary: 134 mg/dL — ABNORMAL HIGH (ref 70–99)

## 2010-11-27 LAB — DIFFERENTIAL
Lymphocytes Relative: 15 % (ref 12–46)
Lymphs Abs: 1.4 10*3/uL (ref 0.7–4.0)
Monocytes Relative: 8 % (ref 3–12)
Neutro Abs: 6.9 10*3/uL (ref 1.7–7.7)
Neutrophils Relative %: 74 % (ref 43–77)

## 2010-11-28 ENCOUNTER — Inpatient Hospital Stay (HOSPITAL_COMMUNITY): Payer: Medicare Other

## 2010-11-28 LAB — BASIC METABOLIC PANEL
BUN: 5 mg/dL — ABNORMAL LOW (ref 6–23)
CO2: 24 mEq/L (ref 19–32)
Calcium: 8.7 mg/dL (ref 8.4–10.5)
Creatinine, Ser: 0.79 mg/dL (ref 0.4–1.5)
GFR calc Af Amer: >60 mL/min (ref >60)

## 2010-11-28 LAB — GLUCOSE, CAPILLARY
Glucose-Capillary: 142 mg/dL — ABNORMAL HIGH (ref 70–99)
Glucose-Capillary: 112 mg/dL — ABNORMAL HIGH (ref 70–99)
Glucose-Capillary: 111 mg/dL — ABNORMAL HIGH (ref 70–99)

## 2010-11-29 DIAGNOSIS — I471 Supraventricular tachycardia: Secondary | ICD-10-CM

## 2010-11-29 LAB — GLUCOSE, CAPILLARY
Glucose-Capillary: 135 mg/dL — ABNORMAL HIGH (ref 70–99)
Glucose-Capillary: 120 mg/dL — ABNORMAL HIGH (ref 70–99)
Glucose-Capillary: 118 mg/dL — ABNORMAL HIGH (ref 70–99)

## 2010-11-29 NOTE — Op Note (Signed)
Marcus Chambers, Marcus Chambers                ACCOUNT NO.:  [id number]  MEDICAL RECORD NO.:  [id number]           PATIENT TYPE:  I  LOCATION:  1317                         FACILITY:  [facility]  PHYSICIAN:  Valetta Faller, M.D.  DATE OF BIRTH:  1941-02-19  DATE OF PROCEDURE:  11/24/2010 DATE OF DISCHARGE:                              OPERATIVE REPORT   PREOPERATIVE DIAGNOSIS:  Colovesical fistula.  POSTOPERATIVE DIAGNOSIS:  Colovesical fistula.  PROCEDURE PERFORMED: 1. Cystoscopy with bilateral retrograde pyelogram with fluoroscopic     interpretation and bilateral double-J stent placement. 2. Closure of colovesical fistula.  SURGEON:  Valetta Propst, MD  ASSISTANT:  [name], MD  ANESTHESIA:  General.  INDICATIONS:  Marcus Chambers is 70 years of age.  He was sent to see me in consultation by Dr. Luretha Murphy for further assessment of colovesical fistula.  The patient had previously been seen by an outside urologist and had prior history of prostate cancer, treated by radiation therapy. He has known bilateral complex renal cysts that did not appear to show any significant pathology.  The patient's PSA has been undetectable. The patient began experiencing fecal material in his urine along with hematuria.  CT scan showed urine in the bladder and thickening consistent with a fistula.  Clinical assessment here confirmed that. The patient is now presenting for definitive surgical exploration and repair by Dr. Daphine Deutscher.  He asked for our involvement in the case for preoperative assessment along with placement of double-J stents to avoid ureteral injury and then for potential help with closure of the fistula. This was all discussed with the patient and full informed consent obtained.  TECHNIQUE AND FINDINGS:  The patient was brought to the operating room. He had successful induction of general anesthesia, was placed in lithotomy position, prepped and draped in the usual manner.  The  patient received perioperative antibiotics.  Appropriate surgical time-out was performed.  Cystoscopy revealed unremarkable anterior and prostatic urethra.  On the posterior wall of the bladder down below towards the bladder base and just to the right at the midline, was an area of abnormal mucosa with significant chronic inflammation, consistent with fistula site.  Retrograde pyelograms were done bilaterally with cone-tip catheters and fluoroscopic interpretation.  Both ureters appeared normal without evidence of obstruction, filling defect, or any communication to the GI tract.  Guide wires were placed one of the time to the renal pelvis on both sides.  Over that, a 26-French double-J stent was placed bilaterally and confirmed to be in good position.  At the completion of this, a Foley catheter was inserted and the surgical procedure was turned over to Dr. Luretha Murphy and his assistant.  Several hours later after the bowel had been resected off the back of the bladder, we were contacted to come back to the operating room.  There, one could see the small fistulous communication.  The bladder was filled with 300 cc of saline and very minimal leakage occurred out of this opening.  The base of the bladder showed significant chronic inflammatory changes, but there was no evidence of malignancy.  A 2-0  Vicryl suture was used to close the fistulous opening.  We then utilizing electrocautery to free up some peritoneum off the back of the bladder which were then extended as a flap over the prior opening and closed to provide additional layers of support and more importantly to keep the bowel contents from adhering to this side and potentially causing refistulization.  The bladder was again filled and the repair appeared to be watertight.  We asked Dr. Daphine Deutscher to place a drain within the pelvis and to consider placement of additional omentum over the site if at all possible during closure.   The operation again was then concluded by Dr. Daphine Deutscher.     Valetta Bolon, M.D.     DSG/MEDQ  D:  11/24/2010  T:  11/25/2010  Job:  [id number]  cc:   Thornton Park Daphine Deutscher, MD [address] [place] [address]  Electronically Signed by Barron Alvine M.D. on 11/29/2010 07:04:25 PM

## 2010-11-30 LAB — GLUCOSE, CAPILLARY
Glucose-Capillary: 100 mg/dL — ABNORMAL HIGH (ref 70–99)
Glucose-Capillary: 118 mg/dL — ABNORMAL HIGH (ref 70–99)
Glucose-Capillary: 119 mg/dL — ABNORMAL HIGH (ref 70–99)
Glucose-Capillary: 155 mg/dL — ABNORMAL HIGH (ref 70–99)

## 2010-12-01 ENCOUNTER — Inpatient Hospital Stay (HOSPITAL_COMMUNITY)
Admission: AD | Admit: 2010-12-01 | Discharge: 2010-12-01 | Disposition: A | Discharge status: Home / Self Care | Payer: Medicare Other | Source: Other Acute Inpatient Hospital | Attending: Cardiology | Admitting: Cardiology

## 2010-12-01 DIAGNOSIS — I4949 Other premature depolarization: Secondary | ICD-10-CM

## 2010-12-01 LAB — CBC
HCT: 30.2 % — ABNORMAL LOW (ref 39.0–52.0)
Hemoglobin: 9.6 g/dL — ABNORMAL LOW (ref 13.0–17.0)
MCH: 25.9 pg — ABNORMAL LOW (ref 26.0–34.0)
MCHC: 31.8 g/dL (ref 30.0–36.0)
MCV: 81.4 fL (ref 78.0–100.0)
Platelets: 276 K/uL (ref 150–400)
RBC: 3.71 MIL/uL — ABNORMAL LOW (ref 4.22–5.81)
RDW: 14.2 % (ref 11.5–15.5)
WBC: 7.1 K/uL (ref 4.0–10.5)

## 2010-12-01 LAB — BASIC METABOLIC PANEL
Chloride: 103 mEq/L (ref 96–112)
Creatinine, Ser: 0.84 mg/dL (ref 0.4–1.5)
GFR calc Af Amer: >60 mL/min (ref >60)
GFR calc non Af Amer: >60 mL/min (ref >60)
Potassium: 3.9 mEq/L (ref 3.5–5.1)

## 2010-12-01 LAB — GLUCOSE, CAPILLARY
Glucose-Capillary: 120 mg/dL — ABNORMAL HIGH (ref 70–99)
Glucose-Capillary: 102 mg/dL — ABNORMAL HIGH (ref 70–99)
Glucose-Capillary: 114 mg/dL — ABNORMAL HIGH (ref 70–99)
Glucose-Capillary: 129 mg/dL — ABNORMAL HIGH (ref 70–99)

## 2010-12-01 MED ORDER — TECHNETIUM TC 99M TETROFOSMIN IV KIT
10.0000 | PACK | Freq: Once | INTRAVENOUS | Status: AC | PRN
  Administered 2010-12-01: 10 via INTRAVENOUS

## 2010-12-01 MED ORDER — TECHNETIUM TC 99M TETROFOSMIN IV KIT
30.0000 | PACK | Freq: Once | INTRAVENOUS | Status: AC | PRN
  Administered 2010-12-01: 30 via INTRAVENOUS

## 2010-12-02 ENCOUNTER — Ambulatory Visit (HOSPITAL_COMMUNITY)
Admission: AD | Admit: 2010-12-02 | Discharge: 2010-12-02 | Disposition: A | Discharge status: Home / Self Care | Payer: Medicare Other | Source: Other Acute Inpatient Hospital | Attending: Internal Medicine | Admitting: Internal Medicine

## 2010-12-02 DIAGNOSIS — I4949 Other premature depolarization: Secondary | ICD-10-CM | POA: Insufficient documentation

## 2010-12-02 DIAGNOSIS — I2789 Other specified pulmonary heart diseases: Secondary | ICD-10-CM | POA: Insufficient documentation

## 2010-12-02 DIAGNOSIS — I10 Essential (primary) hypertension: Secondary | ICD-10-CM | POA: Insufficient documentation

## 2010-12-02 DIAGNOSIS — R9439 Abnormal result of other cardiovascular function study: Secondary | ICD-10-CM | POA: Insufficient documentation

## 2010-12-02 DIAGNOSIS — E785 Hyperlipidemia, unspecified: Secondary | ICD-10-CM | POA: Insufficient documentation

## 2010-12-02 DIAGNOSIS — I279 Pulmonary heart disease, unspecified: Secondary | ICD-10-CM

## 2010-12-02 LAB — BASIC METABOLIC PANEL
BUN: 5 mg/dL — ABNORMAL LOW (ref 6–23)
CO2: 26 mEq/L (ref 19–32)
Chloride: 103 mEq/L (ref 96–112)
GFR calc Af Amer: >60 mL/min (ref >60)
Potassium: 3.7 mEq/L (ref 3.5–5.1)

## 2010-12-02 LAB — POCT I-STAT 3, VENOUS BLOOD GAS (G3P V)
pCO2, Ven: 38.6 mmHg — ABNORMAL LOW (ref 45.0–50.0)
pCO2, Ven: 38.9 mmHg — ABNORMAL LOW (ref 45.0–50.0)
pH, Ven: 7.408 — ABNORMAL HIGH (ref 7.250–7.300)
pH, Ven: 7.424 — ABNORMAL HIGH (ref 7.250–7.300)
pO2, Ven: 34 mmHg (ref 30.0–45.0)
pO2, Ven: 35 mmHg (ref 30.0–45.0)

## 2010-12-02 LAB — PROTIME-INR: Prothrombin Time: 16 seconds — ABNORMAL HIGH (ref 11.6–15.2)

## 2010-12-02 LAB — HEPARIN LEVEL (UNFRACTIONATED): Heparin Unfractionated: <0.1 IU/mL — ABNORMAL LOW (ref 0.30–0.70)

## 2010-12-02 LAB — CBC
Hemoglobin: 9.8 g/dL — ABNORMAL LOW (ref 13.0–17.0)
MCH: 26.2 pg (ref 26.0–34.0)
MCV: 80.7 fL (ref 78.0–100.0)
Platelets: 297 10*3/uL (ref 150–400)
RBC: 3.74 MIL/uL — ABNORMAL LOW (ref 4.22–5.81)
WBC: 9 10*3/uL (ref 4.0–10.5)

## 2010-12-02 LAB — POCT I-STAT 3, ART BLOOD GAS (G3+)
Acid-base deficit: 1 mmol/L (ref 0.0–2.0)
O2 Saturation: 97 %

## 2010-12-02 LAB — POCT ACTIVATED CLOTTING TIME: Activated Clotting Time: 111 seconds

## 2010-12-02 LAB — GLUCOSE, CAPILLARY: Glucose-Capillary: 112 mg/dL — ABNORMAL HIGH (ref 70–99)

## 2010-12-03 DIAGNOSIS — I428 Other cardiomyopathies: Secondary | ICD-10-CM

## 2010-12-03 LAB — BASIC METABOLIC PANEL
BUN: 6 mg/dL (ref 6–23)
CO2: 25 mEq/L (ref 19–32)
Chloride: 103 mEq/L (ref 96–112)
Creatinine, Ser: 0.89 mg/dL (ref 0.4–1.5)
GFR calc Af Amer: >60 mL/min (ref >60)
Glucose, Bld: 102 mg/dL — ABNORMAL HIGH (ref 70–99)

## 2010-12-03 LAB — CBC
HCT: 28.2 % — ABNORMAL LOW (ref 39.0–52.0)
Hemoglobin: 9 g/dL — ABNORMAL LOW (ref 13.0–17.0)
MCH: 25.9 pg — ABNORMAL LOW (ref 26.0–34.0)
MCV: 81 fL (ref 78.0–100.0)
RBC: 3.48 MIL/uL — ABNORMAL LOW (ref 4.22–5.81)

## 2010-12-05 LAB — GLUCOSE, CAPILLARY
Glucose-Capillary: 103 mg/dL — ABNORMAL HIGH (ref 70–99)
Glucose-Capillary: 106 mg/dL — ABNORMAL HIGH (ref 70–99)
Glucose-Capillary: 111 mg/dL — ABNORMAL HIGH (ref 70–99)
Glucose-Capillary: 114 mg/dL — ABNORMAL HIGH (ref 70–99)
Glucose-Capillary: 119 mg/dL — ABNORMAL HIGH (ref 70–99)
Glucose-Capillary: 135 mg/dL — ABNORMAL HIGH (ref 70–99)
Glucose-Capillary: 141 mg/dL — ABNORMAL HIGH (ref 70–99)
Glucose-Capillary: 112 mg/dL — ABNORMAL HIGH (ref 70–99)
Glucose-Capillary: 138 mg/dL — ABNORMAL HIGH (ref 70–99)

## 2010-12-05 LAB — T4, FREE: Free T4: 1.21 ng/dL (ref 0.80–1.80)

## 2010-12-06 ENCOUNTER — Inpatient Hospital Stay (HOSPITAL_COMMUNITY): Payer: Medicare Other

## 2010-12-06 LAB — GLUCOSE, CAPILLARY: Glucose-Capillary: 134 mg/dL — ABNORMAL HIGH (ref 70–99)

## 2010-12-06 MED ORDER — DIATRIZOATE MEGLUMINE 18 % UR SOLN: 200.0000 mL | Freq: Once | URETHRAL | Status: AC | PRN

## 2010-12-07 LAB — GLUCOSE, CAPILLARY: Glucose-Capillary: 130 mg/dL — ABNORMAL HIGH (ref 70–99)

## 2010-12-07 NOTE — Cardiovascular Report (Signed)
NAMEVIVAN, Marcus Chambers                ACCOUNT NO.:  [id number]  MEDICAL RECORD NO.:  [id number]           PATIENT TYPE:  O  LOCATION:  CATH                         FACILITY:  [facility]  PHYSICIAN:  Bevelyn Buckles. Peaches Vanoverbeke, MDDATE OF BIRTH:  1940-12-01  DATE OF PROCEDURE:  12/02/2010 DATE OF DISCHARGE:                           CARDIAC CATHETERIZATION   PRIMARY CARE PHYSICIAN:  Marcus Massed, MD  Marcus Chambers is a delightful 70 year old male with a history of hypertension and hyperlipidemia.  He just recently underwent repair of a colovesicular fistula.  He was found to have frequent PVCs postop and what appeared to be perhaps idioventricular rhythm.  A Myoview which showed an EF of 44% with evidence of possible inferior ischemia.  His echocardiogram was also normal with an EF of 45% to 50% with evidence of pulmonary hypertension.  He was seen by Dr. Shirlee Latch and referred for cardiac catheterization.  PROCEDURES PERFORMED: 1. Right heart cath. 2. Selective coronary angiography. 3. Left heart cath. 4. Left ventriculogram.  DESCRIPTION OF PROCEDURE:  The risks and indication were explained. Consent was signed and placed on the chart.  The right groin area was prepped and draped in routine sterile fashion, anesthetized with 1% local lidocaine.  A 7-French venous sheath was placed in the right femoral vein and a 5-French arterial sheath was placed in the right femoral artery using modified Seldinger technique.  Standard catheters including a Swan-Ganz catheter, JL-4, JR-4, and angled pigtail were used.  All catheters were exchanged over a wire.  There were no apparent complications.  HEMODYNAMICS:  Right atrial pressure mean of 6.  Right RV pressure 57/80 with EDP of 9.  Pulmonary artery pressure 54/20 with a mean of 33. Pulmonary capillary wedge pressure mean of 13.  There were no significant V-waves.  Central aortic pressure 133/69 with mean of 98. LV pressure 140/90 with EDP of 19.   There was no significant aortic stenosis on pullback.  Fick cardiac output was 8.3, cardiac index 3.3. Pulmonary vascular resistance was 2.4 Woods units.  Aortic saturation was 97%.  Pulmonary artery saturation was 67% and 68%.  Left main was aneurysmal distally but had no significant obstruction. LAD was a long vessel wrapping the apex.  It was aneurysmal proximally and had a 20% plaque in the proximal portion.  Left circumflex was a large vessel that gave off a ramus branch and two OM branches and is angiographically normal.  Right coronary artery was dominant vessel, it gave off an RV branch and PDA and two posterolaterals and is angiographically normal.  Left ventriculogram was done in the RAO position showed an EF of 45% to 50% with mild global hypokinesis.  ASSESSMENT: 1. Essentially normal coronary arteries with minimal left anterior     descending plaquing. 2. Mild left ventricular dysfunction with an ejection fraction of 45%     to 50%. 3. Mild pulmonary hypertension secondary to a high-flow state.  PLAN/DISCUSSION:  We will continue to treat him medically.  We will check a TSH and a thiamine as causes of a high output state.  As he recovers, this may be worthwhile to  look for a peripheral AVM.  I discussed with Dr. Shirlee Latch.     Bevelyn Buckles. Janeal Abadi, MD     DRB/MEDQ  D:  12/02/2010  T:  12/02/2010  Job:  [id number]  cc:   Marcus Massed, MD  Electronically Signed by Arvilla Meres MD on 12/07/2010 09:42:03 PM

## 2010-12-08 LAB — GLUCOSE, CAPILLARY

## 2010-12-13 NOTE — Op Note (Signed)
  Marcus Chambers, Marcus Chambers                ACCOUNT NO.:  [id number]  MEDICAL RECORD NO.:  [id number]           PATIENT TYPE:  LOCATION:                                 FACILITY:  PHYSICIAN:  Valetta Steinfeldt, M.D.  DATE OF BIRTH:  1940/10/09  DATE OF PROCEDURE: DATE OF DISCHARGE:                              OPERATIVE REPORT   PREOPERATIVE DIAGNOSIS:  Retained bilateral double-J stents.  POSTOPERATIVE DIAGNOSIS:  Retained bilateral double-J stents.  PROCEDURE PERFORMED:  Bedside flexible cystoscopy with removal of double- J stents.  SURGEON:  Valetta Schneider, M.D.  ANESTHESIA:  Local.  INDICATIONS:  Marcus Chambers is status post repair of colovesical fistula. Stents were placed preoperatively and are no longer necessary.  They are going to be removed now.  TECHNIQUE AND FINDINGS:  The patient gave full informed consent.  He was prepped and draped in usual manner.  Flexible cystoscope was brought to the bedside.  Lidocaine jelly was instilled.  Cystoscopy revealed both stents to be in good position.  These were extracted with a grasper without difficulty.  No complications or problems occurred.     Valetta Peyser, M.D.     DSG/MEDQ  D:  12/07/2010  T:  12/07/2010  Job:  [id number]  Electronically Signed by Barron Alvine M.D. on 12/13/2010 05:50:04 PM

## 2010-12-16 NOTE — Consult Note (Signed)
NAMEJAQUIN, Chambers NO.:  [id number]  MEDICAL RECORD NO.:  [id number]           PATIENT TYPE:  I  LOCATION:  1232                         FACILITY:  [facility]  PHYSICIAN:  Vesta Mixer, M.D. DATE OF BIRTH:  [birth year]  DATE OF CONSULTATION:  11/24/2010 DATE OF DISCHARGE:                                CONSULTATION   Marcus Chambers is a very nice 70 year old gentleman without any cardiac history.  He has a history of hypertension and hyperlipidemia.  He also has hyperglycemia.  He was admitted emergently with abdominal pain and was ultimately found to have a colovesicular fistula.  He was found to have premature ventricular contractions and bigeminy and we were consulted for further evaluation.  [name] has not had any cardiac symptoms.  He recalls having a stress test up in [facility], [facility] several years ago.  He does not recall the results of that, but suspects that there were probably normal since no further followup was performed.  He has a history of hypertension and has been on atenolol.  He also has a history of diabetes mellitus and takes metformin.  The patient denies any episodes of chest pain or shortness breath.  He denies any syncope or presyncope. He denies any palpitations.  The patient has been able to do most of his normal activities without any significant problems.  The patient recently presented with some abdominal pain, was found to have findings as noted above.  CURRENT MEDICATIONS: 1. Iron sulfate 1 tablet twice a day. 2. Vitamin D 1-2 tablets a day. 3. Enteric-coated aspirin 81 mg a day. 4. Bumex 2 mg a day. 5. Pravastatin 40 mg a day. 6. Atenolol 100 mg tablets - one half tablet a day. 7. Allopurinol 300 mg - one half tablet a day. 8. Doxazosin 8 mg a day. 9. Metformin 500 mg twice a day.  ALLERGIES:  He has no known drug allergies.  PAST MEDICAL HISTORY: 1. Diabetes mellitus. 2. Hypertension. 3. Gout. 4.  Hyperlipidemia.  SOCIAL HISTORY:  The patient is a nonsmoker and he does not drink.  FAMILY HISTORY:  He thinks his mom had some cardiac problems.  REVIEW OF SYSTEMS:  Noted in HPI.  The patient is somewhat of a difficult historian.  This may be due to some pain medications.  PHYSICAL EXAMINATION:  GENERAL:  He is a middle-aged gentleman, in no acute distress.  He seems to be fairly oriented, although he is having a difficult time remembering his medical history. VITAL SIGNS:  He is currently afebrile.  His heart rate is 85.  His blood pressure is 156/75. HEENT:  He has 2+ carotids.  He has no bruits, no JVD, no thyromegaly. LUNGS:  Clear. HEART:  Regular rate, S1 and S2.  He has multiple premature beats. ABDOMEN:  No bowel sounds.  He has an NG tube. EXTREMITIES:  He has no edema.  His EKG reveals normal sinus rhythm.  He has frequent premature ventricular contractions in a bigeminal pattern.  LABORATORY DATA:  His sodium is 136, potassium 4.0, chloride is 102, bicarbonate is 26, glucose is  103, creatinine 0.96.  His hemoglobin is 12.6, hematocrit is 39.9, platelets count is 154, white blood cell count 10.4.  IMPRESSION AND PLAN:  Premature ventricular contractions/bigeminy.  The patient appears to have benign PVCs.  I do not have any assessment of his left ventricular function, although he had a stress test several years ago.  We will try to get a copy of that.  At this point, I think that he can be continued on the same medications.  We can treat him with IV metoprolol as needed for control of his blood pressure and his PVCs. This may actually help with some of the PVCs.  He is normally on atenolol, but has not been able to receive that since being n.p.o. He has not had any cardiac symptoms and I do not think that this represents a myocardial infarction.  Thank you very much for this consultation.     Vesta Mixer, M.D.     PJN/MEDQ  D:  11/24/2010  T:   11/25/2010  Job:  [id number]  cc:   Lucky Cowboy, M.D. Fax: [phone]  Thornton Park Daphine Deutscher, MD [address] [place] [address]  Electronically Signed by Kristeen Miss M.D. on 12/16/2010 04:48:09 PM

## 2010-12-28 NOTE — Discharge Summary (Signed)
  NAMELORENCE, Marcus Chambers                ACCOUNT NO.:  [id number]  MEDICAL RECORD NO.:  [id number]           PATIENT TYPE:  I  LOCATION:  1437                         FACILITY:  [facility]  PHYSICIAN:  [name], MD  DATE OF BIRTH:  January 28, 1941  DATE OF ADMISSION:  11/23/2010 DATE OF DISCHARGE:  12/07/2010                              DISCHARGE SUMMARY   ADMITTING DIAGNOSIS:  Colovesical fistula.  DISCHARGE DIAGNOSIS:  Colovesical fistula, with severe diverticular abscess stricture, requiring resection of colon, and also small bowel loops were involved.  Status post cardiac cath which was negative.  COURSE IN THE HOSPITAL:  [name] is a 70 year old white male who did not have any significant prior cardiac history but had a history in the operating room with some bradycardia.  He went to the operating room and underwent a fairly formidable operation including laparoscopy with mobilization of splenic flexure, he understood the laparoscopy, laparotomy, sigmoid colectomy, and small bowel resection x2 with takedown of colovesical  fistula, repair of the fistula.  This was done by Dr. Daphine Deutscher.  The assistants were Dr. Michele Mcalpine and Dr. Isabel Caprice.  Postoperatively, he was observed and then we were able to keep his Foley in for least 10 days.  He went to the unit initially, did well and had some confusion issues.  He had some SVT and was placed on monitor and found to have some tachyarrhythmias, may be some V-tach.  Eventually Cardiology saw him, they instituted workups and transferred him over to [facility] and back including a nuclear medicine scan and subsequently with a cath which did not show any acute abnormalities.  He had frequent PVCs noted but everything seem to work for him.  The drains we had in we were able to pull and the day prior to discharge, his JJ stent was removed as well as his Foley.  He was ready for discharge on Dec 07, 2010, follow up with Dr. Isabel Caprice.  He was given  a prescription by Dr. Isabel Caprice for Cipro for 5 days.  Incision was healing nicely.  We did put a wound VAC and incisional VAC for a closed wound which because of his obesity did a good job and the wound just healed nicely.  CONDITION:  Improved.  He was given some Vicodin to take for pain.  Med rec was performed and he will follow up with me in about 3 weeks and Dr. Isabel Caprice in a week and follow with Dr. Michele Mcalpine.  Condition, good.     Thornton Park Daphine Deutscher, MD     MBM/MEDQ  D:  12/07/2010  T:  12/07/2010  Job:  [id number]  cc:   Valetta Kalata, M.D. Fax: [phone]  Dr. Michele Mcalpine  Electronically Signed by Luretha Murphy MD on 12/28/2010 04:31:40 PM

## 2011-01-05 ENCOUNTER — Encounter (INDEPENDENT_AMBULATORY_CARE_PROVIDER_SITE_OTHER): Payer: Self-pay | Admitting: Surgery

## 2011-01-15 HISTORY — PX: OTHER SURGICAL HISTORY: SHX169

## 2011-02-07 ENCOUNTER — Encounter (INDEPENDENT_AMBULATORY_CARE_PROVIDER_SITE_OTHER): Payer: Self-pay | Admitting: General Surgery

## 2011-02-08 ENCOUNTER — Encounter (INDEPENDENT_AMBULATORY_CARE_PROVIDER_SITE_OTHER): Payer: Self-pay | Admitting: Surgery

## 2011-02-08 ENCOUNTER — Ambulatory Visit (INDEPENDENT_AMBULATORY_CARE_PROVIDER_SITE_OTHER): Payer: Medicare Other | Admitting: Surgery

## 2011-02-08 VITALS — Temp 97.1°F

## 2011-02-08 DIAGNOSIS — N321 Vesicointestinal fistula: Secondary | ICD-10-CM

## 2011-02-08 NOTE — Progress Notes (Signed)
Marcus Chambers comes in today in followup after his 2 week hospitalization beginning May 9 for a very complicated colovesical fistula and a severe diverticular abscess involving multiple small bowel loops. In retrospect the small bowel loops were partially obstructed and contributed to his remarkable weight loss. I have encouraged him to keep this weight off staying on a low carb diet.  His incisions healed nicely. He is voiding completely. He is having normal bowel movements. He feels much better. I am  pleased with  his postoperative progress.  Return prn

## 2011-09-26 DIAGNOSIS — M109 Gout, unspecified: Secondary | ICD-10-CM | POA: Diagnosis not present

## 2011-09-26 DIAGNOSIS — E782 Mixed hyperlipidemia: Secondary | ICD-10-CM | POA: Diagnosis not present

## 2011-09-26 DIAGNOSIS — E119 Type 2 diabetes mellitus without complications: Secondary | ICD-10-CM | POA: Diagnosis not present

## 2011-09-26 DIAGNOSIS — Z79899 Other long term (current) drug therapy: Secondary | ICD-10-CM | POA: Diagnosis not present

## 2011-09-26 DIAGNOSIS — E559 Vitamin D deficiency, unspecified: Secondary | ICD-10-CM | POA: Diagnosis not present

## 2011-09-26 DIAGNOSIS — I10 Essential (primary) hypertension: Secondary | ICD-10-CM | POA: Diagnosis not present

## 2011-10-10 DIAGNOSIS — C61 Malignant neoplasm of prostate: Secondary | ICD-10-CM | POA: Diagnosis not present

## 2011-10-10 DIAGNOSIS — N321 Vesicointestinal fistula: Secondary | ICD-10-CM | POA: Diagnosis not present

## 2011-10-10 DIAGNOSIS — N3020 Other chronic cystitis without hematuria: Secondary | ICD-10-CM | POA: Diagnosis not present

## 2011-11-10 IMAGING — RF DG CYSTOGRAM 3+V
11 series · 12 of 12 positions shown · non-contrast
Comparison: None.

CLINICAL DATA: Patient history of prostate cancer and colon vesicle
fistula.

CYSTOGRAM
TECHNIQUE: After catheterization of the urinary bladder following
sterile technique the bladder was filled with 200 cc Cysto-Hypaque
30% by drip infusion.  Serial spot images were obtained during
bladder filling and post draining.
Fluoroscopy Time: 1.5 minutes

[Series 1: run · 2 of 2 slices shown (1 of 11)]
[im 1/2]
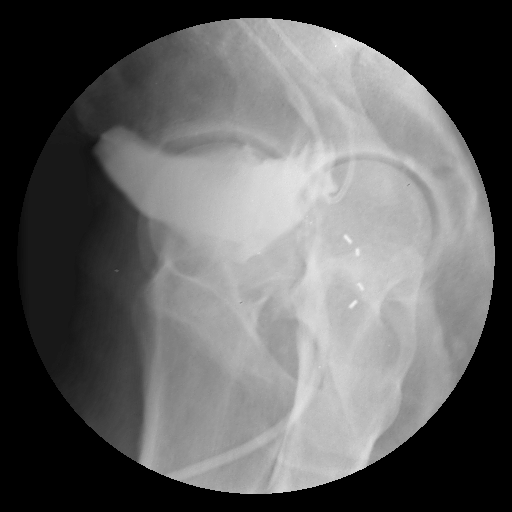
[im 2/2]
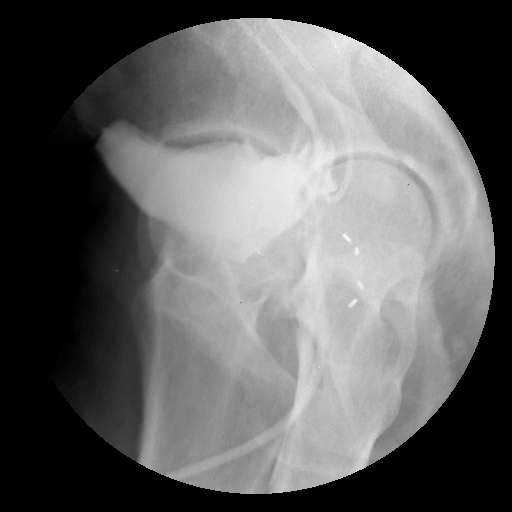

[Series 2: run · 1 of 1 slices shown (2 of 11)]
[im 1/1]
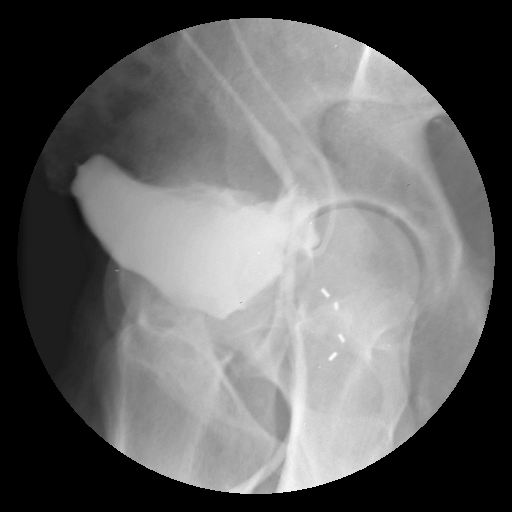

[Series 3: run · 1 of 1 slices shown (3 of 11)]
[im 1/1]
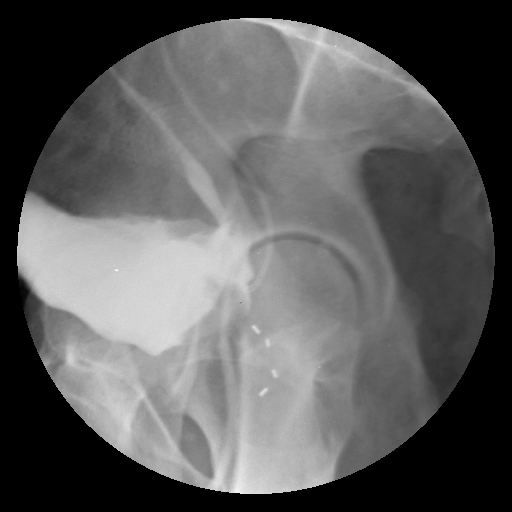

[Series 4: run · 1 of 1 slices shown (4 of 11)]
[im 1/1]
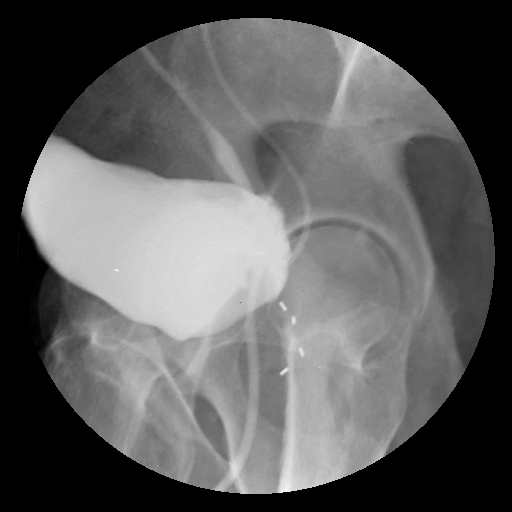

[Series 5: run · 1 of 1 slices shown (5 of 11)]
[im 1/1]
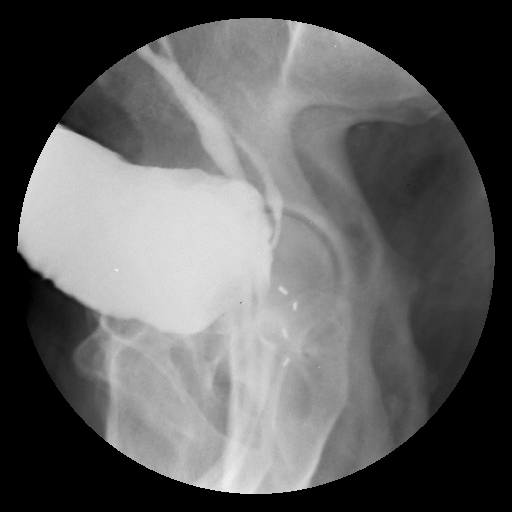

[Series 6: run · 1 of 1 slices shown (6 of 11)]
[im 1/1]
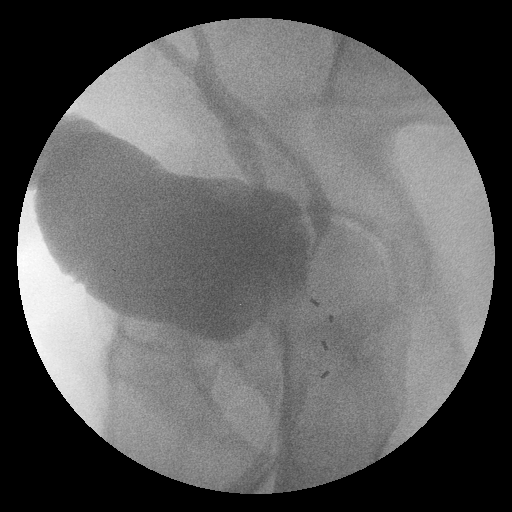

[Series 7: run · 1 of 1 slices shown (7 of 11)]
[im 1/1]
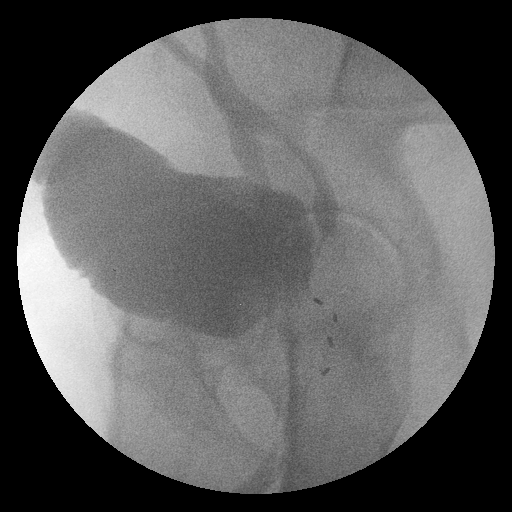

[Series 8: run · 1 of 1 slices shown (8 of 11)]
[im 1/1]
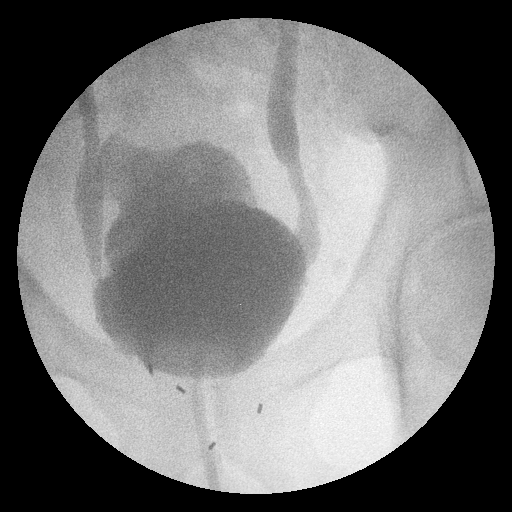

[Series 9: run · 1 of 1 slices shown (9 of 11)]
[im 1/1]
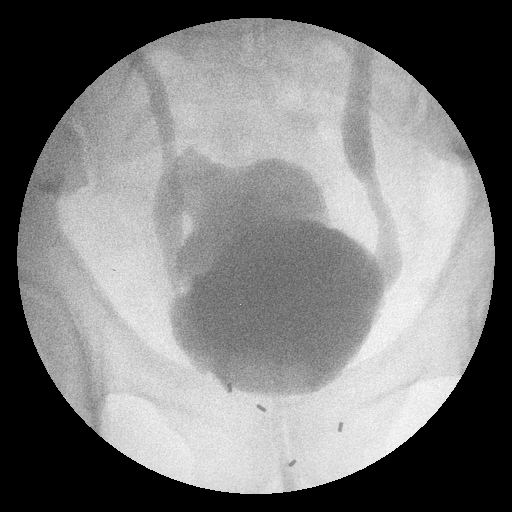

[Series 10: run · 1 of 1 slices shown (10 of 11)]
[im 1/1]
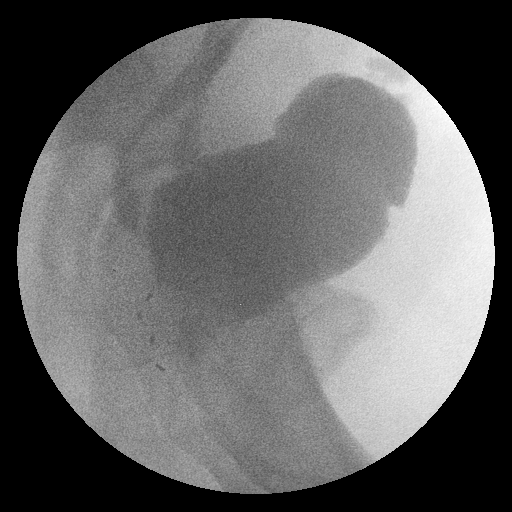

[Series 11: run · 1 of 1 slices shown (11 of 11)]
[im 1/1]
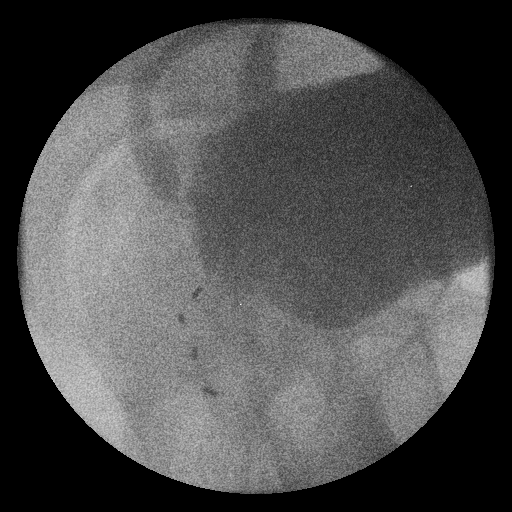

[12 of 12 positions shown; findings below may reference images not displayed]

FINDINGS: No fistula is identified.  The patient has a double-J
ureteral stents in place of contrast material refluxes into the
ureters.  No focal bony abnormality.
IMPRESSION: Negative for fistula.

## 2012-01-10 DIAGNOSIS — I10 Essential (primary) hypertension: Secondary | ICD-10-CM | POA: Diagnosis not present

## 2012-01-10 DIAGNOSIS — E119 Type 2 diabetes mellitus without complications: Secondary | ICD-10-CM | POA: Diagnosis not present

## 2012-01-10 DIAGNOSIS — E559 Vitamin D deficiency, unspecified: Secondary | ICD-10-CM | POA: Diagnosis not present

## 2012-01-10 DIAGNOSIS — E782 Mixed hyperlipidemia: Secondary | ICD-10-CM | POA: Diagnosis not present

## 2012-02-13 DIAGNOSIS — H2510 Age-related nuclear cataract, unspecified eye: Secondary | ICD-10-CM | POA: Diagnosis not present

## 2012-02-13 DIAGNOSIS — H5200 Hypermetropia, unspecified eye: Secondary | ICD-10-CM | POA: Diagnosis not present

## 2012-02-13 DIAGNOSIS — H524 Presbyopia: Secondary | ICD-10-CM | POA: Diagnosis not present

## 2012-03-21 DIAGNOSIS — E559 Vitamin D deficiency, unspecified: Secondary | ICD-10-CM | POA: Diagnosis not present

## 2012-03-21 DIAGNOSIS — Z23 Encounter for immunization: Secondary | ICD-10-CM | POA: Diagnosis not present

## 2012-03-21 DIAGNOSIS — E782 Mixed hyperlipidemia: Secondary | ICD-10-CM | POA: Diagnosis not present

## 2012-03-21 DIAGNOSIS — E119 Type 2 diabetes mellitus without complications: Secondary | ICD-10-CM | POA: Diagnosis not present

## 2012-03-21 DIAGNOSIS — Z79899 Other long term (current) drug therapy: Secondary | ICD-10-CM | POA: Diagnosis not present

## 2012-03-21 DIAGNOSIS — Z1212 Encounter for screening for malignant neoplasm of rectum: Secondary | ICD-10-CM | POA: Diagnosis not present

## 2012-03-21 DIAGNOSIS — C61 Malignant neoplasm of prostate: Secondary | ICD-10-CM | POA: Diagnosis not present

## 2012-03-21 DIAGNOSIS — I10 Essential (primary) hypertension: Secondary | ICD-10-CM | POA: Diagnosis not present

## 2012-04-11 DIAGNOSIS — C61 Malignant neoplasm of prostate: Secondary | ICD-10-CM | POA: Diagnosis not present

## 2012-06-21 DIAGNOSIS — E119 Type 2 diabetes mellitus without complications: Secondary | ICD-10-CM | POA: Diagnosis not present

## 2012-06-21 DIAGNOSIS — I10 Essential (primary) hypertension: Secondary | ICD-10-CM | POA: Diagnosis not present

## 2012-06-21 DIAGNOSIS — Z79899 Other long term (current) drug therapy: Secondary | ICD-10-CM | POA: Diagnosis not present

## 2012-06-21 DIAGNOSIS — R635 Abnormal weight gain: Secondary | ICD-10-CM | POA: Diagnosis not present

## 2012-06-21 DIAGNOSIS — E782 Mixed hyperlipidemia: Secondary | ICD-10-CM | POA: Diagnosis not present

## 2012-10-01 DIAGNOSIS — E559 Vitamin D deficiency, unspecified: Secondary | ICD-10-CM | POA: Diagnosis not present

## 2012-10-01 DIAGNOSIS — E782 Mixed hyperlipidemia: Secondary | ICD-10-CM | POA: Diagnosis not present

## 2012-10-01 DIAGNOSIS — E119 Type 2 diabetes mellitus without complications: Secondary | ICD-10-CM | POA: Diagnosis not present

## 2012-10-01 DIAGNOSIS — Z79899 Other long term (current) drug therapy: Secondary | ICD-10-CM | POA: Diagnosis not present

## 2012-10-01 DIAGNOSIS — C61 Malignant neoplasm of prostate: Secondary | ICD-10-CM | POA: Diagnosis not present

## 2012-10-01 DIAGNOSIS — I10 Essential (primary) hypertension: Secondary | ICD-10-CM | POA: Diagnosis not present

## 2012-10-10 DIAGNOSIS — C61 Malignant neoplasm of prostate: Secondary | ICD-10-CM | POA: Diagnosis not present

## 2012-12-25 DIAGNOSIS — Z79899 Other long term (current) drug therapy: Secondary | ICD-10-CM | POA: Diagnosis not present

## 2012-12-25 DIAGNOSIS — M109 Gout, unspecified: Secondary | ICD-10-CM | POA: Diagnosis not present

## 2012-12-25 DIAGNOSIS — D649 Anemia, unspecified: Secondary | ICD-10-CM | POA: Diagnosis not present

## 2012-12-25 DIAGNOSIS — E119 Type 2 diabetes mellitus without complications: Secondary | ICD-10-CM | POA: Diagnosis not present

## 2012-12-25 DIAGNOSIS — I10 Essential (primary) hypertension: Secondary | ICD-10-CM | POA: Diagnosis not present

## 2012-12-25 DIAGNOSIS — E782 Mixed hyperlipidemia: Secondary | ICD-10-CM | POA: Diagnosis not present

## 2012-12-25 DIAGNOSIS — E559 Vitamin D deficiency, unspecified: Secondary | ICD-10-CM | POA: Diagnosis not present

## 2013-03-27 DIAGNOSIS — I10 Essential (primary) hypertension: Secondary | ICD-10-CM | POA: Diagnosis not present

## 2013-03-27 DIAGNOSIS — E559 Vitamin D deficiency, unspecified: Secondary | ICD-10-CM | POA: Diagnosis not present

## 2013-03-27 DIAGNOSIS — C61 Malignant neoplasm of prostate: Secondary | ICD-10-CM | POA: Diagnosis not present

## 2013-03-27 DIAGNOSIS — E119 Type 2 diabetes mellitus without complications: Secondary | ICD-10-CM | POA: Diagnosis not present

## 2013-03-27 DIAGNOSIS — Z79899 Other long term (current) drug therapy: Secondary | ICD-10-CM | POA: Diagnosis not present

## 2013-03-27 DIAGNOSIS — Z1212 Encounter for screening for malignant neoplasm of rectum: Secondary | ICD-10-CM | POA: Diagnosis not present

## 2013-03-27 DIAGNOSIS — E782 Mixed hyperlipidemia: Secondary | ICD-10-CM | POA: Diagnosis not present

## 2013-04-08 DIAGNOSIS — H43819 Vitreous degeneration, unspecified eye: Secondary | ICD-10-CM | POA: Diagnosis not present

## 2013-04-08 DIAGNOSIS — H2510 Age-related nuclear cataract, unspecified eye: Secondary | ICD-10-CM | POA: Diagnosis not present

## 2013-04-17 DIAGNOSIS — C61 Malignant neoplasm of prostate: Secondary | ICD-10-CM | POA: Diagnosis not present

## 2013-04-17 DIAGNOSIS — N3020 Other chronic cystitis without hematuria: Secondary | ICD-10-CM | POA: Diagnosis not present

## 2013-04-17 DIAGNOSIS — N321 Vesicointestinal fistula: Secondary | ICD-10-CM | POA: Diagnosis not present

## 2013-06-30 ENCOUNTER — Encounter: Payer: Self-pay | Admitting: Internal Medicine

## 2013-06-30 DIAGNOSIS — E1169 Type 2 diabetes mellitus with other specified complication: Secondary | ICD-10-CM | POA: Insufficient documentation

## 2013-06-30 DIAGNOSIS — M1000 Idiopathic gout, unspecified site: Secondary | ICD-10-CM | POA: Insufficient documentation

## 2013-06-30 DIAGNOSIS — E559 Vitamin D deficiency, unspecified: Secondary | ICD-10-CM | POA: Insufficient documentation

## 2013-06-30 DIAGNOSIS — E1122 Type 2 diabetes mellitus with diabetic chronic kidney disease: Secondary | ICD-10-CM | POA: Insufficient documentation

## 2013-06-30 DIAGNOSIS — E119 Type 2 diabetes mellitus without complications: Secondary | ICD-10-CM | POA: Insufficient documentation

## 2013-06-30 DIAGNOSIS — I10 Essential (primary) hypertension: Secondary | ICD-10-CM | POA: Insufficient documentation

## 2013-06-30 DIAGNOSIS — M109 Gout, unspecified: Secondary | ICD-10-CM | POA: Insufficient documentation

## 2013-07-01 ENCOUNTER — Ambulatory Visit (INDEPENDENT_AMBULATORY_CARE_PROVIDER_SITE_OTHER): Payer: Medicare Other | Admitting: Physician Assistant

## 2013-07-01 ENCOUNTER — Encounter: Payer: Self-pay | Admitting: Physician Assistant

## 2013-07-01 VITALS — BP 138/84 | HR 60 | Temp 97.9°F | Resp 16 | Ht 73.0 in | Wt 293.0 lb

## 2013-07-01 DIAGNOSIS — E785 Hyperlipidemia, unspecified: Secondary | ICD-10-CM | POA: Diagnosis not present

## 2013-07-01 DIAGNOSIS — I10 Essential (primary) hypertension: Secondary | ICD-10-CM

## 2013-07-01 DIAGNOSIS — Z79899 Other long term (current) drug therapy: Secondary | ICD-10-CM | POA: Diagnosis not present

## 2013-07-01 DIAGNOSIS — E119 Type 2 diabetes mellitus without complications: Secondary | ICD-10-CM | POA: Diagnosis not present

## 2013-07-01 DIAGNOSIS — M109 Gout, unspecified: Secondary | ICD-10-CM | POA: Diagnosis not present

## 2013-07-01 LAB — BASIC METABOLIC PANEL WITH GFR
BUN: 12 mg/dL (ref 6–23)
Calcium: 9.9 mg/dL (ref 8.4–10.5)
Creat: 0.97 mg/dL (ref 0.50–1.35)
GFR, Est African American: >89 mL/min
GFR, Est Non African American: 78 mL/min
Glucose, Bld: 99 mg/dL (ref 70–99)
Potassium: 4.5 mEq/L (ref 3.5–5.3)
Sodium: 138 mEq/L (ref 135–145)

## 2013-07-01 LAB — CBC WITH DIFFERENTIAL/PLATELET
Basophils Absolute: 0 10*3/uL (ref 0.0–0.1)
Basophils Relative: 1 % (ref 0–1)
Eosinophils Absolute: 0.2 10*3/uL (ref 0.0–0.7)
Eosinophils Relative: 3 % (ref 0–5)
Hemoglobin: 13.1 g/dL (ref 13.0–17.0)
MCH: 26.6 pg (ref 26.0–34.0)
MCHC: 32.4 g/dL (ref 30.0–36.0)
Monocytes Relative: 8 % (ref 3–12)
Neutro Abs: 2.9 10*3/uL (ref 1.7–7.7)
Neutrophils Relative %: 61 % (ref 43–77)
Platelets: 212 10*3/uL (ref 150–400)
RBC: 4.93 MIL/uL (ref 4.22–5.81)
RDW: 14.7 % (ref 11.5–15.5)
WBC: 4.7 10*3/uL (ref 4.0–10.5)

## 2013-07-01 LAB — HEMOGLOBIN A1C
Hgb A1c MFr Bld: 6 % — ABNORMAL HIGH (ref <5.7)
Mean Plasma Glucose: 126 mg/dL — ABNORMAL HIGH (ref <117)

## 2013-07-01 LAB — HEPATIC FUNCTION PANEL
ALT: 14 U/L (ref 0–53)
AST: 17 U/L (ref 0–37)
Bilirubin, Direct: 0.1 mg/dL (ref 0.0–0.3)
Total Protein: 7.3 g/dL (ref 6.0–8.3)

## 2013-07-01 LAB — LIPID PANEL
Cholesterol: 156 mg/dL (ref 0–200)
HDL: 75 mg/dL (ref >39)
Triglycerides: 51 mg/dL (ref <150)
VLDL: 10 mg/dL (ref 0–40)

## 2013-07-01 LAB — MAGNESIUM: Magnesium: 1.5 mg/dL (ref 1.5–2.5)

## 2013-07-01 NOTE — Progress Notes (Signed)
HPI Patient presents for 3 month follow up with hypertension, hyperlipidemia, diabetes and vitamin D. Patient's blood pressure has been controlled at home.  Patient denies chest pain, shortness of breath, dizziness.  Patient's cholesterol is diet controlled. In addition they are on Pravastatin 40  and denies myalgias. The cholesterol last visit was LDL of 64 at goal.  The patient has been working on diet and exercise for Diabetes, and denies changes in vision, polys, and paresthesias. A1C 6.1 (5.9) Magnesium was 1.4 Patient is on Vitamin D supplement.   Current Medications:  Current Outpatient Prescriptions on File Prior to Visit  Medication Sig Dispense Refill  . allopurinol (ZYLOPRIM) 150 mg TABS Take by mouth daily.        Marland Kitchen ALPRAZolam (XANAX) 0.5 MG tablet Take 0.5 mg by mouth at bedtime as needed.        Marland Kitchen aspirin (ASPIRIN LOW DOSE) 81 MG tablet Take 81 mg by mouth as needed.        Marland Kitchen atenolol (TENORMIN) 50 MG tablet Take 50 mg by mouth daily.        . bumetanide (BUMEX) 2 MG tablet Take 2 mg by mouth daily.        . Cholecalciferol (VITAMIN D3) 10000 UNITS capsule Take 10,000 Units by mouth daily. 10000 iu Monday, Wednesday, Friday...5000 iu Tuesday, Thursday, Saturday       . doxazosin (CARDURA) 8 MG tablet Take 8 mg by mouth at bedtime.        . Ferrous Sulfate Dried (FEOSOL) 200 (65 FE) MG TABS Take by mouth.        . metFORMIN (GLUMETZA) 500 MG (MOD) 24 hr tablet Take 500 mg by mouth daily with breakfast.        . naproxen (NAPROSYN) 500 MG tablet Take 500 mg by mouth 2 (two) times daily with a meal.        . pravastatin (PRAVACHOL) 40 MG tablet Take 40 mg by mouth daily.         No current facility-administered medications on file prior to visit.   Medical History:  Past Medical History  Diagnosis Date  . Type II or unspecified type diabetes mellitus without mention of complication, not stated as uncontrolled   . Hyperlipidemia   . Hypertension   . Gout   . Vitamin D  deficiency    Allergies: No Known Allergies  ROS Constitutional: Denies fever, chills, headaches, insomnia, fatigue, night sweats Eyes: Denies redness, blurred vision, diplopia, discharge, itchy, watery eyes.  ENT: Denies congestion, post nasal drip, sore throat, earache, dental pain, Tinnitus, Vertigo, Sinus pain, snoring.  Cardio: Denies chest pain, palpitations, irregular heartbeat, dyspnea, diaphoresis, orthopnea, PND, claudication, edema Respiratory: denies cough, shortness of breath, wheezing.  Gastrointestinal: Denies dysphagia, heartburn, AB pain/ cramps, N/V, diarrhea, constipation, hematemesis, melena, hematochezia,  hemorrhoids Genitourinary: Denies dysuria, frequency, urgency, nocturia, hesitancy, discharge, hematuria, flank pain Musculoskeletal: Denies myalgia, stiffness, pain, swelling and strain/sprain. Skin: Denies pruritis, rash, changing in skin lesion Neuro: Denies Weakness, tremor, incoordination, spasms, pain Psychiatric: Denies confusion, memory loss, sensory loss Endocrine: Denies change in weight, skin, hair change, nocturia Diabetic Polys, Denies visual blurring, hyper /hypo glycemic episodes, and paresthesia, Heme/Lymph: Denies Excessive bleeding, bruising, enlarged lymph nodes  Family history- Review and unchanged Social history- Review and unchanged Physical Exam: Filed Vitals:   07/01/13 1026  BP: 138/84  Pulse: 60  Temp: 97.9 F (36.6 C)  Resp: 16   Filed Weights   07/01/13 1026  Weight: 293 lb (132.904  kg)   General Appearance: Well nourished, in no apparent distress. Eyes: PERRLA, EOMs, conjunctiva no swelling or erythema Sinuses: No Frontal/maxillary tenderness ENT/Mouth: Ext aud canals clear, TMs without erythema, bulging. No erythema, swelling, or exudate on post pharynx.  Tonsils not swollen or erythematous. Hearing normal.  Neck: Supple, thyroid normal.  Respiratory: Respiratory effort normal, BS equal bilaterally without rales, rhonchi,  wheezing or stridor.  Cardio: RRR with no MRGs. Brisk peripheral pulses without edema.  Abdomen: Obese, Soft, + BS.  Non tender, no guarding, rebound, hernias, masses. Lymphatics: Non tender without lymphadenopathy.  Musculoskeletal: Full ROM, 5/5 strength, normal gait.  Skin: Warm, dry without rashes, lesions, ecchymosis.  Neuro: Cranial nerves intact. No cerebellar symptoms. Sensation intact.  Psych: Awake and oriented X 3, normal affect, Insight and Judgment appropriate.   Assessment and Plan:  Hypertension: Continue medication, monitor blood pressure at home.  Continue DASH diet. Cholesterol: Continue diet and exercise. Check cholesterol.  Diabetes-Continue diet and exercise. Check A1C Vitamin D Def- continue medications.  Hypomagnesium- check magnesium Continue diet and meds as discussed. Further disposition pending results of labs. Discussed med's effects and SE's.    Marcus Chambers 10:34 AM

## 2013-07-01 NOTE — Patient Instructions (Addendum)
 Bad carbs also include fruit juice, alcohol, and sweet tea. These are empty calories that do not signal to your brain that you are full.   Please remember the good carbs are still carbs which convert into sugar. So please measure them out no more than 1/2-1 cup of rice, oatmeal, pasta, and beans.  Veggies are however free foods! Pile them on.   I like lean protein at every meal such as chicken, turkey, pork chops, cottage cheese, etc. Just do not fry these meats and please center your meal around vegetable, the meats should be a side dish.   No all fruit is created equal. Please see the list below, the fruit at the bottom is higher in sugars than the fruit at the top   The majority of colds are caused by viruses and do not require antibiotics. Please read the rest of this hand out to learn more about the common cold and what you can do to help yourself as well as help prevent the over use of antibiotics.   COMMON COLD SIGNS AND SYMPTOMS - The common cold usually causes nasal congestion, runny nose, and sneezing. A sore throat may be present on the first day but usually resolves quickly. If a cough occurs, it generally develops on about the fourth or fifth day of symptoms, typically when congestion and runny nose are resolving  COMMON COLD COMPLICATIONS - In most cases, colds do not cause serious illness or complications. Most colds last for three to seven days, although many people continue to have symptoms (coughing, sneezing, congestion) for up to two weeks.  One of the more common complications is sinusitis, which is usually caused by viruses and rarely (about 2 percent of the time) by bacteria. Having thick or yellow to green-colored nasal discharge does not mean that bacterial sinusitis has developed; discolored nasal discharge is a normal phase of the common cold.  Lower respiratory infections, such as pneumonia or bronchitis, may develop following a cold.  Infection of the middle ear, or  otitis media, can accompany or follow a cold.  COMMON COLD TREATMENT - There is no specific treatment for the viruses that cause the common cold. Most treatments are aimed at relieving some of the symptoms of the cold, but do not shorten or cure the cold. Antibiotics are not useful for treating the common cold; antibiotics are only used to treat illnesses caused by bacteria, not viruses. Unnecessary use of antibiotics for the treatment of the common cold can cause allergic reactions, diarrhea, or other gastrointestinal symptoms in some patients.  The symptoms of a cold will resolve over time, even without any treatment. People with underlying medical conditions and those who use other over-the-counter or prescription medications should speak with their healthcare provider or pharmacist to ensure that it is safe to use these treatments. The following are treatments that may reduce the symptoms caused by the common cold.  Nasal congestion - Decongestants are good for nasal congestion- if you feel very stuffy but no mucus is coming out, this is the medication that will help you the most.  Pseudoephedrine is a decongestant that can improve nasal congestion. Although a prescription is not required, drugstores in the [place] keep pseudoephedrine behind the counter, so it must be requested from a pharmacist. If you have a heart condition or high blood pressure please use Coricidin BPH instead.   Runny nose - Antihistamines such as diphenhydramine (Benadryl), certazine (Zyrtec) which are best taking at night because they   can make you tired OR loratadine (Claritin),  fexafinadine (Allegra) help with a runny nose.   Nasal sprays such an oxymetazoline (Afrin and others) may also give temporary relief of nasal congestion. However, these sprays should never be used for more than two to three days; use for more than three days use can worsen congestion.  Nasocort is now over the counter and can help decrease a  runny nose. Please stop the medication if you have blurry vision or nose bleeds.   Sore throat and headache - Sore throat and headache are best treated with a mild pain reliever such as acetaminophen (Tylenol) or a non-steroidal anti-inflammatory agent such as ibuprofen or naproxen (Motrin or Aleve). These medications should be taken with food to prevent stomach problems. As well as gargling with warm water and salt.   Cough - Common cough medicine ingredients include guaifenesin and dextromethorphan; these are often combined with other medications in over-the-counter cold formulas. Often a cough is worse at night or first in the morning due to post nasal drip from you nose. You can try to sleep at an angle to decrease a cough.   Alternative treatments - Heated, humidified air can improve symptoms of nasal congestion and runny nose, and causes few to no side effects. A number of alternative products, including vitamin C, doubling up on your vitamin D and herbal products such as echinacea, may help. Certain products, such as nasal gels that contain zinc (eg, Zicam), have been associated with a permanent loss of smell.  Antibiotics - Antibiotics should not be used to treat an uncomplicated common cold. As noted above, colds are caused by viruses. Antibiotics treat bacterial, not viral infections. Some viruses that cause the common cold can also depress the immune system or cause swelling in the lining of the nose or airways; this can, in turn, lead to a bacterial infection. Often you need to give your body 7 days to fight off a common cold while treating the symptoms with the medications listed above. If after 7 days your symptoms are not improving, you are getting worse, you have shortness of breath, chest pain, a fever of over 103 you should seek medical help immediately.   PREVENTION IS THE BEST MEDICINE - Hand washing is an essential and highly effective way to prevent the spread of infection.    Alcohol-based hand rubs are a good alternative for disinfecting hands if a sink is not available.  Hands should be washed before preparing food and eating and after coughing, blowing the nose, or sneezing. While it is not always possible to limit contact with people who may be infected with a cold, touching the eyes, nose, or mouth after direct contact should be avoided when possible. Sneezing/coughing into the sleeve of one's clothing (at the inner elbow) is another means of containing sprays of saliva and secretions and does not contaminate the hands.     

## 2013-07-02 LAB — INSULIN, FASTING: Insulin fasting, serum: 12 u[IU]/mL (ref 3–28)

## 2013-07-29 ENCOUNTER — Other Ambulatory Visit: Payer: Self-pay | Admitting: Internal Medicine

## 2013-07-31 ENCOUNTER — Other Ambulatory Visit: Payer: Self-pay | Admitting: Internal Medicine

## 2013-08-10 ENCOUNTER — Other Ambulatory Visit: Payer: Self-pay | Admitting: Internal Medicine

## 2013-08-28 ENCOUNTER — Other Ambulatory Visit: Payer: Self-pay

## 2013-09-05 ENCOUNTER — Other Ambulatory Visit: Payer: Self-pay | Admitting: Internal Medicine

## 2013-09-05 ENCOUNTER — Telehealth: Payer: Self-pay | Admitting: *Deleted

## 2013-09-05 DIAGNOSIS — L02229 Furuncle of trunk, unspecified: Secondary | ICD-10-CM

## 2013-09-05 DIAGNOSIS — I10 Essential (primary) hypertension: Secondary | ICD-10-CM | POA: Insufficient documentation

## 2013-09-05 NOTE — Telephone Encounter (Signed)
Patient called and states he has boil on abdomen.  RX for Keflex sent to [facility] per Dr Melford Aase.

## 2013-09-08 ENCOUNTER — Telehealth: Payer: Self-pay | Admitting: *Deleted

## 2013-09-08 MED ORDER — CEPHALEXIN 500 MG PO CAPS: 500.0000 mg | ORAL_CAPSULE | Freq: Four times a day (QID) | ORAL | Status: AC

## 2013-09-08 NOTE — Telephone Encounter (Signed)
Patient called and states he did not get RX for Keflex called in on 09/05/2013.  New RX for Kelex called to [facility] in [place], [place].

## 2013-09-10 ENCOUNTER — Other Ambulatory Visit: Payer: Self-pay | Admitting: *Deleted

## 2013-09-10 ENCOUNTER — Telehealth: Payer: Self-pay | Admitting: Internal Medicine

## 2013-09-10 MED ORDER — METFORMIN HCL ER (MOD) 500 MG PO TB24: 1000.0000 mg | ORAL_TABLET | Freq: Two times a day (BID) | ORAL | Status: DC

## 2013-09-10 NOTE — Telephone Encounter (Signed)
Pt needs metformin refill, pharm told him that they sent [place] a request, please advise pt,  Pt out of medicine!  [email or web address],[place] [phone]

## 2013-09-11 ENCOUNTER — Other Ambulatory Visit: Payer: Self-pay | Admitting: Internal Medicine

## 2013-09-11 MED ORDER — METFORMIN HCL ER 500 MG PO TB24: ORAL_TABLET | ORAL | Status: DC

## 2013-10-14 ENCOUNTER — Encounter: Payer: Self-pay | Admitting: Internal Medicine

## 2013-10-14 ENCOUNTER — Ambulatory Visit: Payer: Self-pay | Admitting: Internal Medicine

## 2013-10-14 ENCOUNTER — Ambulatory Visit (INDEPENDENT_AMBULATORY_CARE_PROVIDER_SITE_OTHER): Payer: Medicare Other | Admitting: Internal Medicine

## 2013-10-14 VITALS — BP 142/84 | HR 60 | Temp 97.9°F | Resp 16 | Ht 73.75 in | Wt 282.6 lb

## 2013-10-14 DIAGNOSIS — Z79899 Other long term (current) drug therapy: Secondary | ICD-10-CM | POA: Diagnosis not present

## 2013-10-14 DIAGNOSIS — I10 Essential (primary) hypertension: Secondary | ICD-10-CM

## 2013-10-14 DIAGNOSIS — E785 Hyperlipidemia, unspecified: Secondary | ICD-10-CM

## 2013-10-14 DIAGNOSIS — M109 Gout, unspecified: Secondary | ICD-10-CM | POA: Diagnosis not present

## 2013-10-14 DIAGNOSIS — E119 Type 2 diabetes mellitus without complications: Secondary | ICD-10-CM

## 2013-10-14 DIAGNOSIS — E559 Vitamin D deficiency, unspecified: Secondary | ICD-10-CM

## 2013-10-14 DIAGNOSIS — Z8546 Personal history of malignant neoplasm of prostate: Secondary | ICD-10-CM | POA: Insufficient documentation

## 2013-10-14 DIAGNOSIS — Z125 Encounter for screening for malignant neoplasm of prostate: Secondary | ICD-10-CM | POA: Insufficient documentation

## 2013-10-14 DIAGNOSIS — Z136 Encounter for screening for cardiovascular disorders: Secondary | ICD-10-CM | POA: Insufficient documentation

## 2013-10-14 LAB — BASIC METABOLIC PANEL WITH GFR
BUN: 14 mg/dL (ref 6–23)
CALCIUM: 9.7 mg/dL (ref 8.4–10.5)
CO2: 27 mEq/L (ref 19–32)
CREATININE: 0.97 mg/dL (ref 0.50–1.35)
Chloride: 103 mEq/L (ref 96–112)
GFR, Est Non African American: 78 mL/min
Glucose, Bld: 96 mg/dL (ref 70–99)
Potassium: 4.1 mEq/L (ref 3.5–5.3)
Sodium: 138 mEq/L (ref 135–145)

## 2013-10-14 LAB — VITAMIN D 25 HYDROXY (VIT D DEFICIENCY, FRACTURES): Vit D, 25-Hydroxy: 104 ng/mL — ABNORMAL HIGH (ref 30–89)

## 2013-10-14 LAB — CBC WITH DIFFERENTIAL/PLATELET
BASOS ABS: 0.1 10*3/uL (ref 0.0–0.1)
BASOS PCT: 1 % (ref 0–1)
Eosinophils Absolute: 0.1 10*3/uL (ref 0.0–0.7)
Eosinophils Relative: 2 % (ref 0–5)
HEMATOCRIT: 38.7 % — AB (ref 39.0–52.0)
Hemoglobin: 13 g/dL (ref 13.0–17.0)
Lymphocytes Relative: 28 % (ref 12–46)
Lymphs Abs: 1.4 10*3/uL (ref 0.7–4.0)
MCH: 26.5 pg (ref 26.0–34.0)
MCHC: 33.6 g/dL (ref 30.0–36.0)
MCV: 78.8 fL (ref 78.0–100.0)
MONO ABS: 0.3 10*3/uL (ref 0.1–1.0)
Monocytes Relative: 6 % (ref 3–12)
NEUTROS PCT: 63 % (ref 43–77)
Neutro Abs: 3.2 10*3/uL (ref 1.7–7.7)
Platelets: 224 10*3/uL (ref 150–400)
RBC: 4.91 MIL/uL (ref 4.22–5.81)
RDW: 15.2 % (ref 11.5–15.5)
WBC: 5 10*3/uL (ref 4.0–10.5)

## 2013-10-14 LAB — MAGNESIUM: MAGNESIUM: 1.6 mg/dL (ref 1.5–2.5)

## 2013-10-14 LAB — HEPATIC FUNCTION PANEL
ALBUMIN: 4.2 g/dL (ref 3.5–5.2)
ALT: 13 U/L (ref 0–53)
AST: 17 U/L (ref 0–37)
Alkaline Phosphatase: 52 U/L (ref 39–117)
Bilirubin, Direct: 0.2 mg/dL (ref 0.0–0.3)
Indirect Bilirubin: 0.5 mg/dL (ref 0.2–1.2)
Total Bilirubin: 0.7 mg/dL (ref 0.2–1.2)
Total Protein: 7.1 g/dL (ref 6.0–8.3)

## 2013-10-14 LAB — LIPID PANEL
Cholesterol: 163 mg/dL (ref 0–200)
HDL: 72 mg/dL (ref >39)
LDL Cholesterol: 82 mg/dL (ref 0–99)
Total CHOL/HDL Ratio: 2.3 Ratio
Triglycerides: 46 mg/dL (ref <150)
VLDL: 9 mg/dL (ref 0–40)

## 2013-10-14 LAB — HEMOGLOBIN A1C
Hgb A1c MFr Bld: 6 % — ABNORMAL HIGH
Mean Plasma Glucose: 126 mg/dL — ABNORMAL HIGH

## 2013-10-14 LAB — URIC ACID: URIC ACID, SERUM: 5.9 mg/dL (ref 4.0–7.8)

## 2013-10-14 LAB — TSH: TSH: 0.762 u[IU]/mL (ref 0.350–4.500)

## 2013-10-14 NOTE — Patient Instructions (Signed)

## 2013-10-14 NOTE — Progress Notes (Signed)
Patient ID: Marcus Chambers, male   DOB: 1941-04-11, 73 y.o.   MRN: [id number]    This very nice 73 y.o. DBM presents for 3 month follow up with Hypertension, Hyperlipidemia, T2 NIDDM and Vitamin D Deficiency. Also, patient has remote Hx/o Prostate Cancer in 2006 Tx'd by radiation Therapy.   HTN predates since 1989. BP has been controlled at home. Today's BP: 142/84 mmHg . Patient denies any cardiac type chest pain, palpitations, dyspnea/orthopnea/PND, dizziness, claudication, or dependent edema.   Hyperlipidemia is controlled with diet & meds. Last Cholesterol was 142, Triglycerides were  65, HDL 65 and LDL 64 in Sept 2014 - all at goal. Patient denies myalgias or other med SE's.    Also, the patient has history of T2 NIDDM since 2005 with last A1c of  6.1% in Sept 2014 and GFR calculated 89 in Sept .Patient denies any symptoms of reactive hypoglycemia, diabetic polys, paresthesias or visual blurring.   Further, Patient has history of Vitamin D Deficiency of 82 in 2008 with last vitamin D of 89 in Sept 2014  . Patient supplements vitamin D without any suspected side-effects.Also, patient has Hx/o Gout controlled with meds and diet.   Medication Sig  . allopurinol (ZYLOPRIM) 300 MG tablet Take 300 mg by mouth daily. Takes 1/2 tablet  . ALPRAZolam (XANAX) 1 MG tablet TAKE ONE-HALF TO ONE TABLET BY MOUTH THREE TIMES DAILY AS NEEDED  . aspirin (ASPIRIN LOW DOSE) 81 MG tablet Take 81 mg by mouth as needed.    Marland Kitchen atenolol (TENORMIN) 50 MG tablet Take 50 mg by mouth daily.    . bumetanide (BUMEX) 2 MG tablet Take 2 mg by mouth daily.    . IRON PO Take 18 mg by mouth daily.  Marland Kitchen lisinopril (PRINIVIL,ZESTRIL) 20 MG tablet Take 20 mg by mouth daily. Take 1/2 tablet daily  . MAGNESIUM PO Take 500 mg by mouth daily.  . metFORMIN (GLUCOPHAGE XR) 500 MG 24 hr tablet Take 2 tablets twice daily for Diabetes  . pravastatin (PRAVACHOL) 40 MG tablet Take 40 mg by mouth daily.    . simvastatin (ZOCOR) 40 MG tablet  TAKE ONE TABLET BY MOUTH AT BEDTIME FOR CHOLESTEROL   No Known Allergies  PMHx:   Type II  diabetes mellitus ; Hyperlipidemia; Hypertension; Gout; and Vitamin D deficiency and Prostate Cancer  FHx:    Reviewed / unchanged  SHx:    Reviewed / unchanged   Systems Review: Constitutional: Denies fever, chills, wt changes, headaches, insomnia, fatigue, night sweats, change in appetite. Eyes: Denies redness, blurred vision, diplopia, discharge, itchy, watery eyes.  ENT: Denies discharge, congestion, post nasal drip, epistaxis, sore throat, earache, hearing loss, dental pain, tinnitus, vertigo, sinus pain, snoring.  CV: Denies chest pain, palpitations, irregular heartbeat, syncope, dyspnea, diaphoresis, orthopnea, PND, claudication, edema. Respiratory: denies cough, dyspnea, DOE, pleurisy, hoarseness, laryngitis, wheezing.  Gastrointestinal: Denies dysphagia, odynophagia, heartburn, reflux, water brash, abdominal pain or cramps, nausea, vomiting, bloating, diarrhea, constipation, hematemesis, melena, hematochezia,  or hemorrhoids. Genitourinary: Denies dysuria, frequency, urgency, nocturia, hesitancy, discharge, hematuria, flank pain. Musculoskeletal: Denies arthralgias, myalgias, stiffness, jt. swelling, pain, limp, strain/sprain.  Skin: Denies pruritus, rash, hives, warts, acne, eczema, change in skin lesion(s). Neuro: No weakness, tremor, incoordination, spasms, paresthesia, or pain. Psychiatric: Denies confusion, memory loss, or sensory loss. Endo: Denies change in weight, skin, hair change.  Heme/Lymph: No excessive bleeding, bruising, orenlarged lymph nodes.  Exam:    BP 142/84  Pulse 60  Temp 97.9 F   Resp 16  Ht 6' 1.75"   Wt 282 lb 9.6 oz   BMI 36.54 kg/m2  Appears well nourished - in no distress. Eyes: PERRLA, EOMs, conjunctiva no swelling or erythema. Sinuses: No frontal/maxillary tenderness ENT/Mouth: EAC's clear, TM's nl w/o erythema, bulging. Nares clear w/o erythema,  swelling, exudates. Oropharynx clear without erythema or exudates. Oral hygiene is good. Tongue normal, non obstructing. Hearing intact.  Neck: Supple. Thyroid nl. Car 2+/2+ without bruits, nodes or JVD. Chest: Respirations nl with BS clear & equal w/o rales, rhonchi, wheezing or stridor.  Cor: Heart sounds normal w/ regular rate and rhythm without sig. murmurs, gallops, clicks, or rubs. Peripheral pulses normal and equal  without edema.  Abdomen: Soft & bowel sounds normal. Non-tender w/o guarding, rebound, hernias, masses, or organomegaly.  Lymphatics: Unremarkable.  Musculoskeletal: Full ROM all peripheral extremities, joint stability, 5/5 strength, and normal gait.  Skin: Warm, dry without exposed rashes, lesions, ecchymosis apparent.  Neuro: Cranial nerves intact, reflexes equal bilaterally. Sensory-motor testing grossly intact. Tendon reflexes grossly intact.  Pysch: Alert & oriented x 3. Insight and judgement nl & appropriate. No ideations.  Assessment and Plan:  1. Hypertension - Continue monitor blood pressure at home. Continue diet/meds same.  2. Hyperlipidemia - Continue diet/meds, exercise,& lifestyle modifications. Continue monitor periodic cholesterol/liver & renal functions   3. T2 NIDDM - continue recommend prudent low glycemic diet, weight control, regular exercise, diabetic monitoring and periodic eye exams.  4. Vitamin D Deficiency - Continue supplementation.  5. Prostate Cancer  Recommended regular exercise, BP monitoring, weight control, and discussed med and SE's. Recommended labs to assess and monitor clinical status. Further disposition pending results of labs.

## 2013-10-15 LAB — INSULIN, FASTING: Insulin fasting, serum: 10 u[IU]/mL (ref 3–28)

## 2013-10-21 ENCOUNTER — Telehealth: Payer: Self-pay | Admitting: *Deleted

## 2013-10-21 NOTE — Telephone Encounter (Signed)
Pt aware of lab results 

## 2013-11-22 ENCOUNTER — Other Ambulatory Visit: Payer: Self-pay | Admitting: Internal Medicine

## 2014-01-15 ENCOUNTER — Encounter: Payer: Self-pay | Admitting: Emergency Medicine

## 2014-01-15 ENCOUNTER — Ambulatory Visit (INDEPENDENT_AMBULATORY_CARE_PROVIDER_SITE_OTHER): Payer: Medicare Other | Admitting: Emergency Medicine

## 2014-01-15 VITALS — BP 176/92 | HR 74 | Temp 98.4°F | Resp 18 | Ht 73.75 in | Wt 287.0 lb

## 2014-01-15 DIAGNOSIS — E119 Type 2 diabetes mellitus without complications: Secondary | ICD-10-CM | POA: Diagnosis not present

## 2014-01-15 DIAGNOSIS — I10 Essential (primary) hypertension: Secondary | ICD-10-CM | POA: Diagnosis not present

## 2014-01-15 DIAGNOSIS — E782 Mixed hyperlipidemia: Secondary | ICD-10-CM

## 2014-01-15 MED ORDER — ALPRAZOLAM 1 MG PO TABS: 1.0000 mg | ORAL_TABLET | Freq: Three times a day (TID) | ORAL | Status: DC | PRN

## 2014-01-15 NOTE — Progress Notes (Signed)
Subjective:    Patient ID: Marcus Chambers, male    DOB: [birth year], 73 y.o.   MRN: [id number]  HPI Comments: 73 yo presents for 3 month F/U for HTN, Cholesterol, DM, D. deficient He notes his BP is better at home. He is exercising QD. He is eating a little better. BS is good. He is only taking 1/2 Xanax at bedtime which helps with sleep. He checks feet routinely and denies skin break down or neuropathy increase.  WBC             5.0   10/14/2013 HGB            13.0   10/14/2013 HCT            38.7   10/14/2013 PLT             224   10/14/2013 GLUCOSE          96   10/14/2013 CHOL            163   10/14/2013 TRIG             46   10/14/2013 HDL              72   10/14/2013 LDLCALC          82   10/14/2013 ALT              13   10/14/2013 AST              17   10/14/2013 NA              138   10/14/2013 K               4.1   10/14/2013 CL              103   10/14/2013 CREATININE     0.97   10/14/2013 BUN              14   10/14/2013 CO2              27   10/14/2013 TSH           0.762   10/14/2013 INR            1.26   12/02/2010 HGBA1C          6.0   10/14/2013   Hyperlipidemia  Hypertension  Diabetes      Medication List       This list is accurate as of: 01/15/14 11:09 AM.  Always use your most recent med list.               allopurinol 300 MG tablet  Commonly known as:  ZYLOPRIM  Take 300 mg by mouth daily. Takes 1/2 tablet     ALPRAZolam 1 MG tablet  Commonly known as:  XANAX  TAKE ONE-HALF TO ONE TABLET BY MOUTH THREE TIMES DAILY AS NEEDED     ASPIRIN LOW DOSE 81 MG tablet  Generic drug:  aspirin  Take 81 mg by mouth as needed.     bumetanide 2 MG tablet  Commonly known as:  BUMEX  Take 2 mg by mouth daily.     IRON PO  Take 18 mg by mouth daily.     lisinopril 20 MG tablet  Commonly known as:  PRINIVIL,ZESTRIL  TAKE ONE TABLET BY MOUTH EVERY DAY     MAGNESIUM PO  Take 500 mg by mouth 2 (  two) times daily.     metFORMIN 500 MG 24 hr tablet  Commonly known as:   GLUCOPHAGE XR  Take 2 tablets twice daily for Diabetes     simvastatin 40 MG tablet  Commonly known as:  ZOCOR  TAKE ONE TABLET BY MOUTH AT BEDTIME FOR CHOLESTEROL     VITAMIN D PO  Take 5,000 Units by mouth. Takes 5000 iu on Mon and Thur and 10000 iu the other 5 days of week       No Known Allergies  Past Medical History  Diagnosis Date  . Type II or unspecified type diabetes mellitus without mention of complication, not stated as uncontrolled   . Hyperlipidemia   . Hypertension   . Gout   . Vitamin D deficiency      Review of Systems  All other systems reviewed and are negative.  BP 176/92  Pulse 74  Temp(Src) 98.4 F (36.9 C) (Temporal)  Resp 18  Ht 6' 1.75" (1.873 m)  Wt 287 lb (130.182 kg)  BMI 37.11 kg/m2     Objective:   Physical Exam  Nursing note and vitals reviewed. Constitutional: He is oriented to person, place, and time. He appears well-developed and well-nourished.  obese  HENT:  Head: Normocephalic and atraumatic.  Right Ear: External ear normal.  Left Ear: External ear normal.  Nose: Nose normal.  Eyes: Conjunctivae and EOM are normal.  Neck: Normal range of motion. Neck supple. No JVD present. No thyromegaly present.  Cardiovascular: Normal rate, regular rhythm, normal heart sounds and intact distal pulses.   Pulmonary/Chest: Effort normal and breath sounds normal.  Abdominal: Soft. Bowel sounds are normal. He exhibits no distension. There is no tenderness.  Musculoskeletal: Normal range of motion. He exhibits no edema and no tenderness.  Lymphadenopathy:    He has no cervical adenopathy.  Neurological: He is alert and oriented to person, place, and time. No cranial nerve deficit. Coordination normal.  Skin: Skin is warm and dry.  Exposed area  Psychiatric: He has a normal mood and affect. His behavior is normal. Judgment and thought content normal.          Assessment & Plan:  1.  3 month F/U for HTN, Cholesterol,DM, D. Deficient.  Needs healthy diet, cardio QD and obtain healthy weight. Check Labs, Check BP if >130/80 call office, Check BS if >200 call office

## 2014-01-15 NOTE — Patient Instructions (Signed)
Diabetes and Foot Care Diabetes may cause you to have problems because of poor blood supply (circulation) to your feet and legs. This may cause the skin on your feet to become thinner, break easier, and heal more slowly. Your skin may become dry, and the skin may peel and crack. You may also have nerve damage in your legs and feet causing decreased feeling in them. You may not notice minor injuries to your feet that could lead to infections or more serious problems. Taking care of your feet is one of the most important things you can do for yourself.  HOME CARE INSTRUCTIONS  Wear shoes at all times, even in the house. Do not go barefoot. Bare feet are easily injured.  Check your feet daily for blisters, cuts, and redness. If you cannot see the bottom of your feet, use a mirror or ask someone for help.  Wash your feet with warm water (do not use hot water) and mild soap. Then pat your feet and the areas between your toes until they are completely dry. Do not soak your feet as this can dry your skin.  Apply a moisturizing lotion or petroleum jelly (that does not contain alcohol and is unscented) to the skin on your feet and to dry, brittle toenails. Do not apply lotion between your toes.  Trim your toenails straight across. Do not dig under them or around the cuticle. File the edges of your nails with an emery board or nail file.  Do not cut corns or calluses or try to remove them with medicine.  Wear clean socks or stockings every day. Make sure they are not too tight. Do not wear knee-high stockings since they may decrease blood flow to your legs.  Wear shoes that fit properly and have enough cushioning. To break in new shoes, wear them for just a few hours a day. This prevents you from injuring your feet. Always look in your shoes before you put them on to be sure there are no objects inside.  Do not cross your legs. This may decrease the blood flow to your feet.  If you find a minor scrape,  cut, or break in the skin on your feet, keep it and the skin around it clean and dry. These areas may be cleansed with mild soap and water. Do not cleanse the area with peroxide, alcohol, or iodine.  When you remove an adhesive bandage, be sure not to damage the skin around it.  If you have a wound, look at it several times a day to make sure it is healing.  Do not use heating pads or hot water bottles. They may burn your skin. If you have lost feeling in your feet or legs, you may not know it is happening until it is too late.  Make sure your health care provider performs a complete foot exam at least annually or more often if you have foot problems. Report any cuts, sores, or bruises to your health care provider immediately. SEEK MEDICAL CARE IF:   You have an injury that is not healing.  You have cuts or breaks in the skin.  You have an ingrown nail.  You notice redness on your legs or feet.  You feel burning or tingling in your legs or feet.  You have pain or cramps in your legs and feet.  Your legs or feet are numb.  Your feet always feel cold. SEEK IMMEDIATE MEDICAL CARE IF:   There is increasing redness,   swelling, or pain in or around a wound.  There is a red line that goes up your leg.  Pus is coming from a wound.  You develop a fever or as directed by your health care provider.  You notice a bad smell coming from an ulcer or wound. Document Released: 06/30/2000 Document Revised: 03/05/2013 Document Reviewed: 12/10/2012 Exit[employer] Patient Information 2015 [employer]. This information is not intended to replace advice given to you by your health care provider. Make sure you discuss any questions you have with your health care provider.  

## 2014-01-16 DIAGNOSIS — E7800 Pure hypercholesterolemia, unspecified: Secondary | ICD-10-CM | POA: Diagnosis not present

## 2014-01-16 DIAGNOSIS — Z8639 Personal history of other endocrine, nutritional and metabolic disease: Secondary | ICD-10-CM | POA: Diagnosis not present

## 2014-01-16 DIAGNOSIS — I10 Essential (primary) hypertension: Secondary | ICD-10-CM | POA: Diagnosis not present

## 2014-01-16 DIAGNOSIS — Z87891 Personal history of nicotine dependence: Secondary | ICD-10-CM | POA: Diagnosis not present

## 2014-01-16 DIAGNOSIS — E119 Type 2 diabetes mellitus without complications: Secondary | ICD-10-CM | POA: Diagnosis not present

## 2014-01-16 DIAGNOSIS — Z8249 Family history of ischemic heart disease and other diseases of the circulatory system: Secondary | ICD-10-CM | POA: Diagnosis not present

## 2014-01-16 DIAGNOSIS — Z79899 Other long term (current) drug therapy: Secondary | ICD-10-CM | POA: Diagnosis not present

## 2014-01-16 DIAGNOSIS — Z862 Personal history of diseases of the blood and blood-forming organs and certain disorders involving the immune mechanism: Secondary | ICD-10-CM | POA: Diagnosis not present

## 2014-01-16 DIAGNOSIS — Z833 Family history of diabetes mellitus: Secondary | ICD-10-CM | POA: Diagnosis not present

## 2014-01-16 LAB — LIPID PANEL
Cholesterol: 157 mg/dL (ref 0–200)
HDL: 71 mg/dL (ref >39)
LDL CALC: 77 mg/dL (ref 0–99)
Total CHOL/HDL Ratio: 2.2 Ratio
Triglycerides: 46 mg/dL (ref <150)
VLDL: 9 mg/dL (ref 0–40)

## 2014-01-16 LAB — BASIC METABOLIC PANEL WITH GFR
BUN: 13 mg/dL (ref 6–23)
CALCIUM: 9.4 mg/dL (ref 8.4–10.5)
CO2: 24 mEq/L (ref 19–32)
CREATININE: 0.83 mg/dL (ref 0.50–1.35)
Chloride: 104 mEq/L (ref 96–112)
GFR, EST NON AFRICAN AMERICAN: 88 mL/min
Glucose, Bld: 100 mg/dL — ABNORMAL HIGH (ref 70–99)
Potassium: 4.3 mEq/L (ref 3.5–5.3)
Sodium: 137 mEq/L (ref 135–145)

## 2014-01-16 LAB — HEPATIC FUNCTION PANEL
ALT: 15 U/L (ref 0–53)
AST: 16 U/L (ref 0–37)
Albumin: 3.8 g/dL (ref 3.5–5.2)
Alkaline Phosphatase: 48 U/L (ref 39–117)
BILIRUBIN DIRECT: 0.1 mg/dL (ref 0.0–0.3)
BILIRUBIN INDIRECT: 0.5 mg/dL (ref 0.2–1.2)
Total Bilirubin: 0.6 mg/dL (ref 0.2–1.2)
Total Protein: 6.7 g/dL (ref 6.0–8.3)

## 2014-01-16 LAB — CBC WITH DIFFERENTIAL/PLATELET
BASOS ABS: 0.1 10*3/uL (ref 0.0–0.1)
Basophils Relative: 1 % (ref 0–1)
EOS ABS: 0.2 10*3/uL (ref 0.0–0.7)
Eosinophils Relative: 4 % (ref 0–5)
HCT: 37.7 % — ABNORMAL LOW (ref 39.0–52.0)
Hemoglobin: 12.5 g/dL — ABNORMAL LOW (ref 13.0–17.0)
LYMPHS ABS: 0.9 10*3/uL (ref 0.7–4.0)
Lymphocytes Relative: 16 % (ref 12–46)
MCH: 26.3 pg (ref 26.0–34.0)
MCHC: 33.2 g/dL (ref 30.0–36.0)
MCV: 79.4 fL (ref 78.0–100.0)
Monocytes Absolute: 0.5 10*3/uL (ref 0.1–1.0)
Monocytes Relative: 9 % (ref 3–12)
NEUTROS PCT: 70 % (ref 43–77)
Neutro Abs: 3.9 10*3/uL (ref 1.7–7.7)
PLATELETS: 217 10*3/uL (ref 150–400)
RBC: 4.75 MIL/uL (ref 4.22–5.81)
RDW: 15.5 % (ref 11.5–15.5)
WBC: 5.6 10*3/uL (ref 4.0–10.5)

## 2014-01-16 LAB — HEMOGLOBIN A1C
HEMOGLOBIN A1C: 6.2 % — AB (ref <5.7)
Mean Plasma Glucose: 131 mg/dL — ABNORMAL HIGH (ref <117)

## 2014-01-16 LAB — INSULIN, FASTING: INSULIN FASTING, SERUM: 11 u[IU]/mL (ref 3–28)

## 2014-02-21 ENCOUNTER — Other Ambulatory Visit: Payer: Self-pay | Admitting: Internal Medicine

## 2014-03-13 ENCOUNTER — Encounter: Payer: Self-pay | Admitting: *Deleted

## 2014-04-02 ENCOUNTER — Encounter: Payer: Self-pay | Admitting: Internal Medicine

## 2014-04-07 ENCOUNTER — Other Ambulatory Visit: Payer: Self-pay | Admitting: Internal Medicine

## 2014-04-13 ENCOUNTER — Other Ambulatory Visit: Payer: Self-pay | Admitting: *Deleted

## 2014-04-13 MED ORDER — BUMETANIDE 2 MG PO TABS: 2.0000 mg | ORAL_TABLET | Freq: Two times a day (BID) | ORAL | Status: DC

## 2014-05-01 DIAGNOSIS — Z8546 Personal history of malignant neoplasm of prostate: Secondary | ICD-10-CM | POA: Diagnosis not present

## 2014-05-05 ENCOUNTER — Encounter: Payer: Self-pay | Admitting: Gastroenterology

## 2014-05-14 ENCOUNTER — Encounter: Payer: Self-pay | Admitting: Gastroenterology

## 2014-05-19 ENCOUNTER — Ambulatory Visit (INDEPENDENT_AMBULATORY_CARE_PROVIDER_SITE_OTHER): Payer: Medicare Other | Admitting: Internal Medicine

## 2014-05-19 ENCOUNTER — Encounter: Payer: Self-pay | Admitting: Internal Medicine

## 2014-05-19 VITALS — BP 140/86 | HR 76 | Temp 97.5°F | Resp 18 | Ht 73.5 in | Wt 280.6 lb

## 2014-05-19 DIAGNOSIS — I10 Essential (primary) hypertension: Secondary | ICD-10-CM | POA: Diagnosis not present

## 2014-05-19 DIAGNOSIS — Z0000 Encounter for general adult medical examination without abnormal findings: Secondary | ICD-10-CM | POA: Diagnosis not present

## 2014-05-19 DIAGNOSIS — E785 Hyperlipidemia, unspecified: Secondary | ICD-10-CM

## 2014-05-19 DIAGNOSIS — M1000 Idiopathic gout, unspecified site: Secondary | ICD-10-CM

## 2014-05-19 DIAGNOSIS — Z125 Encounter for screening for malignant neoplasm of prostate: Secondary | ICD-10-CM

## 2014-05-19 DIAGNOSIS — R6889 Other general symptoms and signs: Secondary | ICD-10-CM

## 2014-05-19 DIAGNOSIS — Z0001 Encounter for general adult medical examination with abnormal findings: Secondary | ICD-10-CM

## 2014-05-19 DIAGNOSIS — Z79899 Other long term (current) drug therapy: Secondary | ICD-10-CM | POA: Diagnosis not present

## 2014-05-19 DIAGNOSIS — Z1212 Encounter for screening for malignant neoplasm of rectum: Secondary | ICD-10-CM

## 2014-05-19 DIAGNOSIS — Z136 Encounter for screening for cardiovascular disorders: Secondary | ICD-10-CM | POA: Diagnosis not present

## 2014-05-19 DIAGNOSIS — E119 Type 2 diabetes mellitus without complications: Secondary | ICD-10-CM | POA: Diagnosis not present

## 2014-05-19 DIAGNOSIS — Z1331 Encounter for screening for depression: Secondary | ICD-10-CM

## 2014-05-19 DIAGNOSIS — E559 Vitamin D deficiency, unspecified: Secondary | ICD-10-CM

## 2014-05-19 DIAGNOSIS — Z9181 History of falling: Secondary | ICD-10-CM

## 2014-05-19 DIAGNOSIS — Z23 Encounter for immunization: Secondary | ICD-10-CM | POA: Diagnosis not present

## 2014-05-19 LAB — CBC WITH DIFFERENTIAL/PLATELET
BASOS ABS: 0.1 10*3/uL (ref 0.0–0.1)
BASOS PCT: 1 % (ref 0–1)
Eosinophils Absolute: 0.1 10*3/uL (ref 0.0–0.7)
Eosinophils Relative: 2 % (ref 0–5)
HCT: 38.3 % — ABNORMAL LOW (ref 39.0–52.0)
HEMOGLOBIN: 12.7 g/dL — AB (ref 13.0–17.0)
Lymphocytes Relative: 26 % (ref 12–46)
Lymphs Abs: 1.3 10*3/uL (ref 0.7–4.0)
MCH: 26.6 pg (ref 26.0–34.0)
MCHC: 33.2 g/dL (ref 30.0–36.0)
MCV: 80.3 fL (ref 78.0–100.0)
MONOS PCT: 6 % (ref 3–12)
Monocytes Absolute: 0.3 10*3/uL (ref 0.1–1.0)
NEUTROS ABS: 3.3 10*3/uL (ref 1.7–7.7)
NEUTROS PCT: 65 % (ref 43–77)
Platelets: 232 10*3/uL (ref 150–400)
RBC: 4.77 MIL/uL (ref 4.22–5.81)
RDW: 14.9 % (ref 11.5–15.5)
WBC: 5 10*3/uL (ref 4.0–10.5)

## 2014-05-19 LAB — HEMOGLOBIN A1C
HEMOGLOBIN A1C: 5.7 % — AB (ref <5.7)
Mean Plasma Glucose: 117 mg/dL — ABNORMAL HIGH (ref <117)

## 2014-05-19 NOTE — Progress Notes (Signed)
Patient ID: Marcus Chambers, male   DOB: [birth year], 73 y.o.   MRN: [id number]  [facility] VISIT AND CPE  Assessment:   1. Essential hypertension  - Microalbumin / creatinine urine ratio - EKG 12-Lead - [place], RETROPERITNL ABD,  LTD - TSH  2. Hyperlipidemia  - Lipid panel  3. Diabetes mellitus without complication  - Hemoglobin A1c - Insulin, fasting  4. Vitamin D deficiency  - Vit D  25 hydroxy (rtn osteoporosis monitoring)  5. Idiopathic gout, unspecified chronicity, unspecified site  - Uric acid  6. Screening for rectal cancer  - POC Hemoccult Bld/Stl    7. Encounter for general adult medical examination with abnormal findings  - CBC with Differential - BASIC METABOLIC PANEL WITH GFR - Hepatic function panel - Magnesium  8. Medication management  - Urine Microscopic  9. Screening for prostate cancer  - PSA done recently by Dr Risa Grill  10. At low risk for fall   11. Screening for depression - Negative   12. Need for prophylactic vaccination against Streptococcus pneumoniae (pneumococcus)  - Pneumococcal conjugate vaccine 13-valent   Plan:   During the course of the visit the patient was educated and counseled about appropriate screening and preventive services including:    Pneumococcal vaccine   Influenza vaccine  Td vaccine  Screening electrocardiogram  Bone densitometry screening  Colorectal cancer screening  Diabetes screening  Glaucoma screening  Nutrition counseling   Advanced directives: requested  Screening recommendations, referrals: Vaccinations: DTvaccine 03/21/2012 Influenza vaccine 04/2014 @ [place]. Pneumococcal vaccine 2009 Prevnar vaccine 05/19/2014 Shingles vaccine declined due to cost Hep B vaccine not indicated  Nutrition assessed and recommended  Colonoscopy 06/03/2009 & f/u due 07/2014 Recommended yearly ophthalmology/optometry visit for glaucoma screening and checkup Recommended yearly  dental visit for hygiene and checkup Advanced directives - yes  Conditions/risks identified: BMI: Discussed weight loss, diet, and increase physical activity.  Increase physical activity: AHA recommends 150 minutes of physical activity a week.  Medications reviewed Diabetes is at goal, ACE/ARB therapy: Yes. Urinary Incontinence is not an issue: discussed non pharmacology and pharmacology options.  Fall risk: low- discussed PT, home fall assessment, medications.    Subjective:  Marcus Chambers is a 73 y.o. male who presents for Medicare Annual Wellness Visit and complete physical.  Date of last medicare wellness visit is unknown.  He has had elevated blood pressure since 1989. His blood pressure has been controlled at home, today their BP is BP: 140/86 mmHg. In 2012 he had a false (+) Cardiolite with a Negative Heart Cath. He does workout. He denies chest pain, shortness of breath, dizziness.  He is on cholesterol medication and denies myalgias. His cholesterol is at goal. The cholesterol last visit was:  Lab Results  Component Value Date   CHOL 157 01/15/2014   HDL 71 01/15/2014   LDLCALC 77 01/15/2014   TRIG 46 01/15/2014   CHOLHDL 2.2 01/15/2014   He has had diabetes for 10 years since 2005. He has been working on diet and exercise for prediabetes, and denies foot ulcerations, hyperglycemia, hypoglycemia , nausea, paresthesia of the feet, polydipsia, polyuria and visual disturbances. Last A1C in the office was:  Lab Results  Component Value Date   HGBA1C 6.2* 01/15/2014   Patient is on Vitamin D supplement.   Lab Results  Component Value Date   VD25OH 104* 10/14/2013     Names of Other Physician/Practitioners you currently use: 1. [facility] here for  primary care 2. Dr Judye Bos, eye doctor, last visit 10/2013 3. No dentist, last visit 2012  Patient Care Team: [name], MD as PCP - General (Internal Medicine) Inda Castle,  MD as Consulting Physician (Gastroenterology) Bernestine Amass, MD as Consulting Physician (Urology)  Medication Review: Medication Sig  . allopurinol (ZYLOPRIM) 300 MG tablet TAKE ONE TABLET BY MOUTH EVERY DAY FOR GOUT  . ALPRAZolam (XANAX) 1 MG tablet Take 1 tablet (1 mg total) by mouth 3 (three) times daily as needed for anxiety.  Marland Kitchen aspirin (ASPIRIN LOW DOSE) 81 MG tablet Take 81 mg by mouth as needed.    . bumetanide (BUMEX) 2 MG tablet Take 1 tablet (2 mg total) by mouth 2 (two) times daily.  . Cholecalciferol (VITAMIN D PO) Take 5,000 Units by mouth. Takes 5000 iu on Mon and Thur and 10000 iu the other 5 days of week  . IRON PO Take 18 mg by mouth daily.  Marland Kitchen lisinopril (PRINIVIL,ZESTRIL) 20 MG tablet TAKE ONE TABLET BY MOUTH ONCE DAILY  . MAGNESIUM PO Take 500 mg by mouth daily.   . metFORMIN (GLUCOPHAGE XR) 500 MG 24 hr tablet Take 2 tablets twice daily for Diabetes  . simvastatin (ZOCOR) 40 MG tablet TAKE ONE TABLET BY MOUTH AT BEDTIME FOR CHOLESTEROL   Current Problems (verified) Patient Active Problem List   Diagnosis Date Noted  . Encounter for long-term (current) use of other medications 10/14/2013  . H/O prostate cancer (2006) 10/14/2013  . Essential hypertension 09/05/2013  . T2_NIDDM    . Hyperlipidemia   . Gout   . Vitamin D deficiency   . Personal history of colonic polyps 09/16/2009    Screening Tests Health Maintenance  Topic Date Due  . FOOT EXAM  06/10/1951  . OPHTHALMOLOGY EXAM  06/10/1951  . URINE MICROALBUMIN  06/10/1951  . TETANUS/TDAP  06/09/1960  . ZOSTAVAX  06/09/2001  . INFLUENZA VACCINE  02/14/2014  . HEMOGLOBIN A1C  07/18/2014  . COLONOSCOPY  06/04/2019  . PNEUMOCOCCAL POLYSACCHARIDE VACCINE AGE 80 AND OVER  Completed    Immunization History  Administered Date(s) Administered  . DT 03/21/2012  . Influenza-Unspecified 04/16/2014  . Pneumococcal Conjugate-13 05/19/2014  . Pneumococcal Polysaccharide-23 07/24/2007    Preventative care: Last  colonoscopy: 06/03/2009 - to schedule f/u 07/2014  Prior vaccinations: TD : 03/21/2012  Influenza: 04/2014  Pneumococcal: 07/2007 Shingles/Zostavax: deferred due to cost  History reviewed: allergies, current medications, past family history, past medical history, past social history, past surgical history and problem list  Risk Factors: Tobacco History  Substance Use Topics  . Smoking status: Never Smoker   . Smokeless tobacco: Not on file  . Alcohol Use: No   He does not smoke.  Patient is a former smoker. Are there smokers in your home (other than you)?  No  Alcohol Current alcohol use: none  Caffeine Current caffeine use: denies use  Exercise Current exercise: walking  Nutrition/Diet Current diet: in general, a "healthy" diet    Cardiac risk factors: advanced age (older than 24 for men, 10 for women), diabetes mellitus, dyslipidemia, hypertension, male gender, obesity (BMI >= 30 kg/m2) and sedentary lifestyle.  Depression Screen (Note: if answer to either of the following is "Yes", a more complete depression screening is indicated)   Q1: Over the past two weeks, have you felt down, depressed or hopeless? No  Q2: Over the past two weeks, have you felt little interest or pleasure in doing things? No  Have you lost interest  or pleasure in daily life? No  Do you often feel hopeless? No  Do you cry easily over simple problems? No  Activities of Daily Living In your present state of health, do you have any difficulty performing the following activities?:  Driving? No Managing money?  No Feeding yourself? No Getting from bed to chair? No Climbing a flight of stairs? No Preparing food and eating?: No Bathing or showering? No Getting dressed: No Getting to the toilet? No Using the toilet:No Moving around from place to place: No In the past year have you fallen or had a near fall?:No   Are you sexually active?  No  Do you have more than one partner?  No  Vision  Difficulties: No  Hearing Difficulties: No Do you often ask people to speak up or repeat themselves? No Do you experience ringing or noises in your ears? No Do you have difficulty understanding soft or whispered voices? No  Cognition  Do you feel that you have a problem with memory?No  Do you often misplace items? No  Do you feel safe at home?  Yes  Advanced directives Does patient have a [place]? Yes Does patient have a Living Will? Yes  Objective:     BP 140/86, pulse 76, temp 97.5 F (36.4 C), resp18, ht 6' 1.5" , wt 280 lb 9.6 oz  BMI 36.51  General appearance: alert, no distress, WD/WN, male Cognitive Testing  Alert? Yes  Normal Appearance? Yes  Oriented to person? Yes  Place? Yes   Time? Yes  Recall of three objects?  Yes  Can perform simple calculations? Yes  Displays appropriate judgment? Yes  Can read the correct time from a watch/clock? Yes  HEENT: normocephalic, sclerae anicteric, TMs pearly, nares patent, no discharge or erythema, pharynx normal Oral cavity: MMM, no lesions Neck: supple, no lymphadenopathy, no thyromegaly, no masses Heart: RRR, normal S1, S2, no murmurs Lungs: CTA bilaterally, no wheezes, rhonchi, or rales Abdomen: +bs, soft, non tender, non distended, no masses, no hepatomegaly, no splenomegaly Musculoskeletal: nontender, no swelling, no obvious deformity Extremities: no edema, no cyanosis, no clubbing Pulses: 2+ symmetric, upper and lower extremities, normal cap refill Neurological: alert, oriented x 3, CN2-12 intact, strength normal upper extremities and lower extremities, sensation normal throughout, DTRs 2+ throughout, no cerebellar signs, gait normal Psychiatric: normal affect, behavior normal, pleasant   Medicare Attestation I have personally reviewed: The patient's medical and social history Their use of alcohol, tobacco or illicit drugs Their current medications and supplements The patient's functional  ability including ADLs,fall risks, home safety risks, cognitive, and hearing and visual impairment Diet and physical activities Evidence for depression or mood disorders  The patient's weight, height, BMI, and visual acuity have been recorded in the chart.  I have made referrals, counseling, and provided education to the patient based on review of the above and I have provided the patient with a written personalized care plan for preventive services.    Marcus Jenny DAVID, MD   05/19/2014

## 2014-05-19 NOTE — Patient Instructions (Signed)
 Recommend the book "The END of DIETING" by Dr Joel Furman   and the book "The END of DIABETES " by Dr Joel Fuhrman  At [employer] - get book & Audio CD's      Being diabetic has a  300% increased risk for heart attack, stroke, cancer, and alzheimer- type vascular dementia. It is very important that you work harder with diet by avoiding all foods that are white except chicken & fish. Avoid white rice (brown & wild rice is OK), white potatoes (sweetpotatoes in moderation is OK), White bread or wheat bread or anything made out of white flour like bagels, donuts, rolls, buns, biscuits, cakes, pastries, cookies, pizza crust, and pasta (made from white flour & egg whites) - vegetarian pasta or spinach or wheat pasta is OK. Multigrain breads like Arnold's or [employer], or multigrain sandwich thins or flatbreads.  Diet, exercise and weight loss can reverse and cure diabetes in the early stages.  Diet, exercise and weight loss is very important in the control and prevention of complications of diabetes which affects every system in your body, ie. Brain - dementia/stroke, eyes - glaucoma/blindness, heart - heart attack/heart failure, kidneys - dialysis, stomach - gastric paralysis, intestines - malabsorption, nerves - severe painful neuritis, circulation - gangrene & loss of a leg(s), and finally cancer and Alzheimers.    I recommend avoid fried & greasy foods,  sweets/candy, white rice (brown or wild rice or Quinoa is OK), white potatoes (sweet potatoes are OK) - anything made from white flour - bagels, doughnuts, rolls, buns, biscuits,white and wheat breads, pizza crust and traditional pasta made of white flour & egg white(vegetarian pasta or spinach or wheat pasta is OK).  Multi-grain bread is OK - like multi-grain flat bread or sandwich thins. Avoid alcohol in excess. Exercise is also important.    Eat all the vegetables you want - avoid meat, especially red meat and dairy - especially cheese.  Cheese  is the most concentrated form of trans-fats which is the worst thing to clog up our arteries. Veggie cheese is OK which can be found in the fresh produce section at [employer] or [employer] or [employer]  Preventive Care for Adults  A healthy lifestyle and preventive care can promote health and wellness. Preventive health guidelines for men include the following key practices:  A routine yearly physical is a good way to check with your health care provider about your health and preventative screening. It is a chance to share any concerns and updates on your health and to receive a thorough exam.  Visit your dentist for a routine exam and preventative care every 6 months. Brush your teeth twice a day and floss once a day. Good oral hygiene prevents tooth decay and gum disease.  The frequency of eye exams is based on your age, health, family medical history, use of contact lenses, and other factors. Follow your health care provider's recommendations for frequency of eye exams.  Eat a healthy diet. Foods such as vegetables, fruits, whole grains, low-fat dairy products, and lean protein foods contain the nutrients you need without too many calories. Decrease your intake of foods high in solid fats, added sugars, and salt. Eat the right amount of calories for you.Get information about a proper diet from your health care provider, if necessary.  Regular physical exercise is one of the most important things you can do for your health. Most adults should get at least 150 minutes of moderate-intensity exercise (any activity   that increases your heart rate and causes you to sweat) each week. In addition, most adults need muscle-strengthening exercises on 2 or more days a week.  Maintain a healthy weight. The body mass index (BMI) is a screening tool to identify possible weight problems. It provides an estimate of body fat based on height and weight. Your health care provider can find your BMI and can help  you achieve or maintain a healthy weight.For adults 20 years and older:    A BMI of 18.5 to 24.9 is normal.  A BMI of 25 to 29.9 is considered overweight.  A BMI of 30 and above is considered obese.    Maintain normal blood lipids and cholesterol levels by exercising and minimizing your intake of saturated fat. Eat a balanced diet with plenty of fruit and vegetables. Blood tests for lipids and cholesterol should begin at age 51 and be repeated every 5 years. If your lipid or cholesterol levels are high, you are over 50, or you are at high risk for heart disease, you may need your cholesterol levels checked more frequently.Ongoing high lipid and cholesterol levels should be treated with medicines if diet and exercise are not working.  If you smoke, find out from your health care provider how to quit. If you do not use tobacco, do not start.  Lung cancer screening is recommended for adults aged 62-80 years who are at high risk for developing lung cancer because of a history of smoking. A yearly low-dose CT scan of the lungs is recommended for people who have at least a 30-pack-year history of smoking and are a current smoker or have quit within the past 15 years. A pack year of smoking is smoking an average of 1 pack of cigarettes a day for 1 year (for example: 1 pack a day for 30 years or 2 packs a day for 15 years). Yearly screening should continue until the smoker has stopped smoking for at least 15 years. Yearly screening should be stopped for people who develop a health problem that would prevent them from having lung cancer treatment.  If you choose to drink alcohol, do not have more than 2 drinks per day. One drink is considered to be 12 ounces (355 mL) of beer, 5 ounces (148 mL) of wine, or 1.5 ounces (44 mL) of liquor.  Avoid use of street drugs. Do not share needles with anyone. Ask for help if you need support or instructions about stopping the use of drugs.  High blood pressure  causes heart disease and increases the risk of stroke. Your blood pressure should be checked at least every 1-2 years. Ongoing high blood pressure should be treated with medicines, if weight loss and exercise are not effective.  If you are 72-32 years old, ask your health care provider if you should take aspirin to prevent heart disease.  Diabetes screening involves taking a blood sample to check your fasting blood sugar level. Testing should be considered at a younger age or be carried out more frequently if you are overweight and have at least 1 risk factor for diabetes.  Colorectal cancer can be detected and often prevented. Most routine colorectal cancer screening begins at the age of 13 and continues through age 8. However, your health care provider may recommend screening at an earlier age if you have risk factors for colon cancer. On a yearly basis, your health care provider may provide home test kits to check for hidden blood in the stool. Use  of a small camera at the end of a tube to directly examine the colon (sigmoidoscopy or colonoscopy) can detect the earliest forms of colorectal cancer. Talk to your health care provider about this at age 91, when routine screening begins. Direct exam of the colon should be repeated every 5-10 years through age 38, unless early forms of precancerous polyps or small growths are found.  Hepatitis C blood testing is recommended for all people born from [birth year] through 1965 and any individual with known risks for hepatitis C.  Screening for abdominal aortic aneurysm (AAA)  by ultrasound is recommended for men who have history of high blood pressure or who are current or former smokers.  Healthy men should  receive prostate-specific antigen (PSA) blood tests as part of routine cancer screening. Talk with your health care provider about prostate cancer screening.  Testicular cancer screening is  recommended for adult males. Screening includes self-exam, a health  care provider exam, and other screening tests. Consult with your health care provider about any symptoms you have or any concerns you have about testicular cancer.  Use sunscreen. Apply sunscreen liberally and repeatedly throughout the day. You should seek shade when your shadow is shorter than you. Protect yourself by wearing long sleeves, pants, a wide-brimmed hat, and sunglasses year round, whenever you are outdoors.  Once a month, do a whole-body skin exam, using a mirror to look at the skin on your back. Tell your health care provider about new moles, moles that have irregular borders, moles that are larger than a pencil eraser, or moles that have changed in shape or color.  Stay current with required vaccines (immunizations).  Influenza vaccine. All adults should be immunized every year.  Tetanus, diphtheria, and acellular pertussis (Td, Tdap) vaccine. An adult who has not previously received Tdap or who does not know his vaccine status should receive 1 dose of Tdap. This initial dose should be followed by tetanus and diphtheria toxoids (Td) booster doses every 10 years. Adults with an unknown or incomplete history of completing a 3-dose immunization series with Td-containing vaccines should begin or complete a primary immunization series including a Tdap dose. Adults should receive a Td booster every 10 years.  Zoster vaccine. One dose is recommended for adults aged 74 years or older unless certain conditions are present.    PREVNAR - Pneumococcal 13-valent conjugate (PCV13) vaccine. When indicated, a person who is uncertain of his immunization history and has no record of immunization should receive the PCV13 vaccine. An adult aged 106 years or older who has certain medical conditions and has not been previously immunized should receive 1 dose of PCV13 vaccine. This PCV13 should be followed with a dose of pneumococcal polysaccharide (PPSV23) vaccine. The PPSV23 vaccine dose should be obtained  at least 8 weeks after the dose of PCV13 vaccine. An adult aged 35 years or older who has certain medical conditions and previously received 1 or more doses of PPSV23 vaccine should receive 1 dose of PCV13. The PCV13 vaccine dose should be obtained 1 or more years after the last PPSV23 vaccine dose.    PNEUMOVAX - Pneumococcal polysaccharide (PPSV23) vaccine. When PCV13 is also indicated, PCV13 should be obtained first. All adults aged 106 years and older should be immunized. An adult younger than age 9 years who has certain medical conditions should be immunized. Any person who resides in a nursing home or long-term care facility should be immunized. An adult smoker should be immunized. People with an immunocompromised  condition and certain other conditions should receive both PCV13 and PPSV23 vaccines. People with human immunodeficiency virus (HIV) infection should be immunized as soon as possible after diagnosis. Immunization during chemotherapy or radiation therapy should be avoided. Routine use of PPSV23 vaccine is not recommended for American Indians, [place] Natives, or people younger than 65 years unless there are medical conditions that require PPSV23 vaccine. When indicated, people who have unknown immunization and have no record of immunization should receive PPSV23 vaccine. One-time revaccination 5 years after the first dose of PPSV23 is recommended for people aged 19-64 years who have chronic kidney failure, nephrotic syndrome, asplenia, or immunocompromised conditions. People who received 1-2 doses of PPSV23 before age 69 years should receive another dose of PPSV23 vaccine at age 62 years or later if at least 5 years have passed since the previous dose. Doses of PPSV23 are not needed for people immunized with PPSV23 at or after age 40 years.    Hepatitis A vaccine. Adults who wish to be protected from this disease, have certain high-risk conditions, work with hepatitis A-infected animals, work  in hepatitis A research labs, or travel to or work in countries with a high rate of hepatitis A should be immunized. Adults who were previously unvaccinated and who anticipate close contact with an international adoptee during the first 60 days after arrival in the [place] from a country with a high rate of hepatitis A should be immunized.    Hepatitis B vaccine. Adults should be immunized if they wish to be protected from this disease, have certain high-risk conditions, may be exposed to blood or other infectious body fluids, are household contacts or sex partners of hepatitis B positive people, are clients or workers in certain care facilities, or travel to or work in countries with a high rate of hepatitis B.   Preventive Service / Frequency   Ages 52 and over  Blood pressure check.  Lipid and cholesterol check.  Lung cancer screening. / Every year if you are aged 72-80 years and have a 30-pack-year history of smoking and currently smoke or have quit within the past 15 years. Yearly screening is stopped once you have quit smoking for at least 15 years or develop a health problem that would prevent you from having lung cancer treatment.  Fecal occult blood test (FOBT) of stool. You may not have to do this test if you get a colonoscopy every 10 years.  Flexible sigmoidoscopy** or colonoscopy.** / Every 5 years for a flexible sigmoidoscopy or every 10 years for a colonoscopy beginning at age 3 and continuing until age 49.  Hepatitis C blood test.** / For all people born from [birth year] through 1965 and any individual with known risks for hepatitis C.  Abdominal aortic aneurysm (AAA) screening./ Screening current or former smokers or have Hypertension.  Skin self-exam. / Monthly.  Influenza vaccine. / Every year.  Tetanus, diphtheria, and acellular pertussis (Tdap/Td) vaccine.** / 1 dose of Td every 10 years.   Zoster vaccine.** / 1 dose for adults aged 21 years or older.         Pneumococcal 13-valent conjugate (PCV13) vaccine.    Pneumococcal polysaccharide (PPSV23) vaccine.  Marland Kitchen

## 2014-05-20 LAB — BASIC METABOLIC PANEL WITH GFR
BUN: 12 mg/dL (ref 6–23)
CO2: 27 mEq/L (ref 19–32)
Calcium: 10.1 mg/dL (ref 8.4–10.5)
Chloride: 101 mEq/L (ref 96–112)
Creat: 0.97 mg/dL (ref 0.50–1.35)
GFR, Est Non African American: 78 mL/min
GLUCOSE: 101 mg/dL — AB (ref 70–99)
POTASSIUM: 3.8 meq/L (ref 3.5–5.3)
Sodium: 138 mEq/L (ref 135–145)

## 2014-05-20 LAB — MAGNESIUM: MAGNESIUM: 1.5 mg/dL (ref 1.5–2.5)

## 2014-05-20 LAB — HEPATIC FUNCTION PANEL
ALT: 14 U/L (ref 0–53)
AST: 15 U/L (ref 0–37)
Albumin: 4 g/dL (ref 3.5–5.2)
Alkaline Phosphatase: 52 U/L (ref 39–117)
BILIRUBIN DIRECT: 0.1 mg/dL (ref 0.0–0.3)
Indirect Bilirubin: 0.5 mg/dL (ref 0.2–1.2)
Total Bilirubin: 0.6 mg/dL (ref 0.2–1.2)
Total Protein: 7.4 g/dL (ref 6.0–8.3)

## 2014-05-20 LAB — LIPID PANEL
CHOL/HDL RATIO: 2.1 ratio
CHOLESTEROL: 162 mg/dL (ref 0–200)
HDL: 78 mg/dL (ref >39)
LDL Cholesterol: 72 mg/dL (ref 0–99)
TRIGLYCERIDES: 58 mg/dL (ref <150)
VLDL: 12 mg/dL (ref 0–40)

## 2014-05-20 LAB — URINALYSIS, MICROSCOPIC ONLY
Bacteria, UA: NONE SEEN
Casts: NONE SEEN
Crystals: NONE SEEN
SQUAMOUS EPITHELIAL / LPF: NONE SEEN

## 2014-05-20 LAB — URIC ACID: URIC ACID, SERUM: 5.6 mg/dL (ref 4.0–7.8)

## 2014-05-20 LAB — MICROALBUMIN / CREATININE URINE RATIO
Creatinine, Urine: 138.4 mg/dL
Microalb Creat Ratio: 14.5 mg/g (ref 0.0–30.0)
Microalb, Ur: 2 mg/dL (ref <2.0)

## 2014-05-20 LAB — INSULIN, FASTING: Insulin fasting, serum: 7.5 u[IU]/mL (ref 2.0–19.6)

## 2014-05-20 LAB — TSH: TSH: 0.688 u[IU]/mL (ref 0.350–4.500)

## 2014-05-20 LAB — VITAMIN D 25 HYDROXY (VIT D DEFICIENCY, FRACTURES): VIT D 25 HYDROXY: 99 ng/mL — AB (ref 30–89)

## 2014-07-11 ENCOUNTER — Encounter: Payer: Self-pay | Admitting: *Deleted

## 2014-07-13 ENCOUNTER — Ambulatory Visit (AMBULATORY_SURGERY_CENTER): Payer: Self-pay | Admitting: *Deleted

## 2014-07-13 VITALS — Ht 73.0 in | Wt 289.6 lb

## 2014-07-13 DIAGNOSIS — Z86010 Personal history of colonic polyps: Secondary | ICD-10-CM

## 2014-07-13 MED ORDER — NA SULFATE-K SULFATE-MG SULF 17.5-3.13-1.6 GM/177ML PO SOLN: 1.0000 | Freq: Once | ORAL | Status: DC

## 2014-07-13 NOTE — Progress Notes (Signed)
Denies allergies to eggs or soy products. Denies complications with sedation or anesthesia. Denies O2 use. Denies use of diet or weight loss medications.  Emmi instructions not given for colonoscopy, pt does not have access to the internet.

## 2014-07-21 ENCOUNTER — Ambulatory Visit (AMBULATORY_SURGERY_CENTER): Payer: Medicare Other | Admitting: Gastroenterology

## 2014-07-21 ENCOUNTER — Encounter: Payer: Self-pay | Admitting: Gastroenterology

## 2014-07-21 VITALS — BP 119/71 | HR 60 | Temp 96.4°F | Resp 24 | Ht 73.0 in | Wt 289.0 lb

## 2014-07-21 DIAGNOSIS — Z86010 Personal history of colonic polyps: Secondary | ICD-10-CM

## 2014-07-21 DIAGNOSIS — K5730 Diverticulosis of large intestine without perforation or abscess without bleeding: Secondary | ICD-10-CM

## 2014-07-21 DIAGNOSIS — K648 Other hemorrhoids: Secondary | ICD-10-CM

## 2014-07-21 DIAGNOSIS — M109 Gout, unspecified: Secondary | ICD-10-CM | POA: Diagnosis not present

## 2014-07-21 DIAGNOSIS — I10 Essential (primary) hypertension: Secondary | ICD-10-CM | POA: Diagnosis not present

## 2014-07-21 LAB — GLUCOSE, CAPILLARY
GLUCOSE-CAPILLARY: 86 mg/dL (ref 70–99)
Glucose-Capillary: 101 mg/dL — ABNORMAL HIGH (ref 70–99)

## 2014-07-21 MED ORDER — SODIUM CHLORIDE 0.9 % IV SOLN: 500.0000 mL | INTRAVENOUS | Status: DC

## 2014-07-21 NOTE — Patient Instructions (Signed)

## 2014-07-21 NOTE — Progress Notes (Signed)
Stable to RR 

## 2014-07-21 NOTE — Op Note (Signed)
[facility]  [address] [place], [address]   COLONOSCOPY PROCEDURE REPORT  PATIENT: Marcus Chambers, Marcus Chambers  MR#: [id number] BIRTHDATE: 1941-06-21 , 73  yrs. old GENDER: male ENDOSCOPIST: Inda Castle, MD REFERRED UG:[id number] Melford Aase, M.D. PROCEDURE DATE:  07/21/2014 PROCEDURE:   Colonoscopy, diagnostic First Screening Colonoscopy - Avg.  risk and is 50 yrs.  old or older - No.  Prior Negative Screening - Now for repeat screening. N/A  History of Adenoma - Now for follow-up colonoscopy & has been > or = to 3 yrs.  Yes hx of adenoma.  Has been 3 or more years since last colonoscopy.  Polyps Removed Today? No.  Recommend repeat exam, <10 yrs? Yes.  High risk (family or personal hx). ASA CLASS:   Class II INDICATIONS:high risk personal history of colonic polyps.2010 MEDICATIONS: Monitored anesthesia care, Propofol 200 mg IV, and lidocaine 40mg  IV  DESCRIPTION OF PROCEDURE:   After the risks benefits and alternatives of the procedure were thoroughly explained, informed consent was obtained.  The digital rectal exam revealed no abnormalities of the rectum.   The [id number]  endoscope was introduced through the anus and advanced to the cecum, which was identified by both the appendix and ileocecal valve. No adverse events experienced.   The quality of the prep was excellent using Suprep  The instrument was then slowly withdrawn as the colon was fully examined.      COLON FINDINGS: There was mild diverticulosis noted in the sigmoid colon.   Internal hemorrhoids were found.   The examination was otherwise normal.  Retroflexed views revealed no abnormalities. The time to cecum=2 minutes 57 seconds.  Withdrawal time=6 minutes 02 seconds.  The scope was withdrawn and the procedure completed. COMPLICATIONS: There were no immediate complications.  ENDOSCOPIC IMPRESSION: 1.   Mild diverticulosis was noted in the sigmoid colon 2.   Internal hemorrhoids 3.   The  examination was otherwise normal  RECOMMENDATIONS: Colonoscopy 7 years  eSigned:  Inda Castle, MD 07/21/2014 9:57 AM   cc:

## 2014-07-22 ENCOUNTER — Telehealth: Payer: Self-pay

## 2014-07-22 NOTE — Telephone Encounter (Signed)
  Follow up Call-  Call back number 07/21/2014  Post procedure Call Back phone  # [phone]  Permission to leave phone message Yes     Patient questions:  Do you have a fever, pain , or abdominal swelling? No. Pain Score  0 *  Have you tolerated food without any problems? Yes.    Have you been able to return to your normal activities? Yes.    Do you have any questions about your discharge instructions: Diet   No. Medications  No. Follow up visit  No.  Do you have questions or concerns about your Care? No.  Actions: * If pain score is 4 or above: No action needed, pain <4.

## 2014-08-04 ENCOUNTER — Other Ambulatory Visit: Payer: Self-pay | Admitting: Emergency Medicine

## 2014-08-25 ENCOUNTER — Ambulatory Visit (INDEPENDENT_AMBULATORY_CARE_PROVIDER_SITE_OTHER): Payer: Medicare Other | Admitting: Physician Assistant

## 2014-08-25 VITALS — BP 128/80 | HR 68 | Temp 97.9°F | Resp 16 | Ht 73.5 in | Wt 292.0 lb

## 2014-08-25 DIAGNOSIS — I10 Essential (primary) hypertension: Secondary | ICD-10-CM

## 2014-08-25 DIAGNOSIS — E119 Type 2 diabetes mellitus without complications: Secondary | ICD-10-CM | POA: Diagnosis not present

## 2014-08-25 DIAGNOSIS — E785 Hyperlipidemia, unspecified: Secondary | ICD-10-CM

## 2014-08-25 DIAGNOSIS — M1000 Idiopathic gout, unspecified site: Secondary | ICD-10-CM

## 2014-08-25 DIAGNOSIS — Z79899 Other long term (current) drug therapy: Secondary | ICD-10-CM

## 2014-08-25 DIAGNOSIS — E559 Vitamin D deficiency, unspecified: Secondary | ICD-10-CM

## 2014-08-25 DIAGNOSIS — E669 Obesity, unspecified: Secondary | ICD-10-CM

## 2014-08-25 LAB — HEPATIC FUNCTION PANEL
ALBUMIN: 3.8 g/dL (ref 3.5–5.2)
ALT: 13 U/L (ref 0–53)
AST: 15 U/L (ref 0–37)
Alkaline Phosphatase: 51 U/L (ref 39–117)
Bilirubin, Direct: 0.1 mg/dL (ref 0.0–0.3)
Indirect Bilirubin: 0.4 mg/dL (ref 0.2–1.2)
Total Bilirubin: 0.5 mg/dL (ref 0.2–1.2)
Total Protein: 6.9 g/dL (ref 6.0–8.3)

## 2014-08-25 LAB — LIPID PANEL
Cholesterol: 149 mg/dL (ref 0–200)
HDL: 66 mg/dL (ref >39)
LDL CALC: 76 mg/dL (ref 0–99)
Total CHOL/HDL Ratio: 2.3 Ratio
Triglycerides: 36 mg/dL (ref <150)
VLDL: 7 mg/dL (ref 0–40)

## 2014-08-25 LAB — BASIC METABOLIC PANEL WITH GFR
BUN: 13 mg/dL (ref 6–23)
CHLORIDE: 105 meq/L (ref 96–112)
CO2: 25 mEq/L (ref 19–32)
Calcium: 9.4 mg/dL (ref 8.4–10.5)
Creat: 0.85 mg/dL (ref 0.50–1.35)
GFR, EST NON AFRICAN AMERICAN: 86 mL/min
GFR, Est African American: >89 mL/min
Glucose, Bld: 91 mg/dL (ref 70–99)
POTASSIUM: 4.1 meq/L (ref 3.5–5.3)
SODIUM: 139 meq/L (ref 135–145)

## 2014-08-25 LAB — HEMOGLOBIN A1C
Hgb A1c MFr Bld: 6.2 % — ABNORMAL HIGH (ref <5.7)
MEAN PLASMA GLUCOSE: 131 mg/dL — AB (ref <117)

## 2014-08-25 LAB — TSH: TSH: 0.798 u[IU]/mL (ref 0.350–4.500)

## 2014-08-25 LAB — MAGNESIUM: MAGNESIUM: 1.6 mg/dL (ref 1.5–2.5)

## 2014-08-25 NOTE — Patient Instructions (Signed)
ACE inhibitors are blood pressure medications that protect your heart and kidneys. The most common symptom is a dry cough/tickle in your throat that can happen the first day you take it or 5 years after you have been taking it. Please call us if you have this and we can switch it to a different medications.  Before you even begin to attack a weight-loss plan, it pays to remember this: You are not fat. You have fat. Losing weight isn't about blame or shame; it's simply another achievement to accomplish. Dieting is like any other skill-you have to buckle down and work at it. As long as you act in a smart, reasonable way, you'll ultimately get where you want to be. Here are some weight loss pearls for you.  1. It's Not a Diet. It's a Lifestyle Thinking of a diet as something you're on and suffering through only for the short term doesn't work. To shed weight and keep it off, you need to make permanent changes to the way you eat. It's OK to indulge occasionally, of course, but if you cut calories temporarily and then revert to your old way of eating, you'll gain back the weight quicker than you can say yo-yo. Use it to lose it. Research shows that one of the best predictors of long-term weight loss is how many pounds you drop in the first month. For that reason, nutritionists often suggest being stricter for the first two weeks of your new eating strategy to build momentum. Cut out added sugar and alcohol and avoid unrefined carbs. After that, figure out how you can reincorporate them in a way that's healthy and maintainable.  2. There's a Right Way to Exercise Working out burns calories and fat and boosts your metabolism by building muscle. But those trying to lose weight are notorious for overestimating the number of calories they burn and underestimating the amount they take in. Unfortunately, your system is biologically programmed to hold on to extra pounds and that means when you start exercising, your body  senses the deficit and ramps up its hunger signals. If you're not diligent, you'll eat everything you burn and then some. Use it to lose it. Cardio gets all the exercise glory, but strength and interval training are the real heroes. They help you build lean muscle, which in turn increases your metabolism and calorie-burning ability 3. Don't Overreact to Mild Hunger Some people have a hard time losing weight because of hunger anxiety. To them, being hungry is bad-something to be avoided at all costs-so they carry snacks with them and eat when they don't need to. Others eat because they're stressed out or bored. While you never want to get to the point of being ravenous (that's when bingeing is likely to happen), a hunger pang, a craving, or the fact that it's 3:00 p.m. should not send you racing for the vending machine or obsessing about the energy bar in your purse. Ideally, you should put off eating until your stomach is growling and it's difficult to concentrate.  Use it to lose it. When you feel the urge to eat, use the HALT method. Ask yourself, Am I really hungry? Or am I angry or anxious, lonely or bored, or tired? If you're still not certain, try the apple test. If you're truly hungry, an apple should seem delicious; if it doesn't, something else is going on. Or you can try drinking water and making yourself busy, if you are still hungry try a healthy snack.  4. Not All Calories Are Created Equal The mechanics of weight loss are pretty simple: Take in fewer calories than you use for energy. But the kind of food you eat makes all the difference. Processed food that's high in saturated fat and refined starch or sugar can cause inflammation that disrupts the hormone signals that tell your brain you're full. The result: You eat a lot more.  Use it to lose it. Clean up your diet. Swap in whole, unprocessed foods, including vegetables, lean protein, and healthy fats that will fill you up and give you the  biggest nutritional bang for your calorie buck. In a few weeks, as your brain starts receiving regular hunger and fullness signals once again, you'll notice that you feel less hungry overall and naturally start cutting back on the amount you eat.  5. Protein, Produce, and Plant-Based Fats Are Your Weight-Loss Trinity Here's why eating the three Ps regularly will help you drop pounds. Protein fills you up. You need it to build lean muscle, which keeps your metabolism humming so that you can torch more fat. People in a weight-loss program who ate double the recommended daily allowance for protein (about 110 grams for a 150-pound woman) lost 70 percent of their weight from fat, while people who ate the RDA lost only about 40 percent, one study found. Produce is packed with filling fiber. "It's very difficult to consume too many calories if you're eating a lot of vegetables. Example: Three cups of broccoli is a lot of food, yet only 93 calories. (Fruit is another story. It can be easy to overeat and can contain a lot of calories from sugar, so be sure to monitor your intake.) Plant-based fats like olive oil and those in avocados and nuts are healthy and extra satiating.  Use it to lose it. Aim to incorporate each of the three Ps into every meal and snack. People who eat protein throughout the day are able to keep weight off, according to a study in the [place] of Clinical Nutrition. In addition to meat, poultry and seafood, good sources are beans, lentils, eggs, tofu, and yogurt. As for fat, keep portion sizes in check by measuring out salad dressing, oil, and nut butters (shoot for one to two tablespoons). Finally, eat veggies or a little fruit at every meal. People who did that consumed 308 fewer calories but didn't feel any hungrier than when they didn't eat more produce.  7. How You Eat Is As Important As What You Eat In order for your brain to register that you're full, you need to focus on what  you're eating. Sit down whenever you eat, preferably at a table. Turn off the TV or computer, put down your phone, and look at your food. Smell it. Chew slowly, and don't put another bite on your fork until you swallow. When women ate lunch this attentively, they consumed 30 percent less when snacking later than those who listened to an audiobook at lunchtime, according to a study in the [place] of Nutrition. 8. Weighing Yourself Really Works The scale provides the best evidence about whether your efforts are paying off. Seeing the numbers tick up or down or stagnate is motivation to keep going-or to rethink your approach. A 2015 study at [facility] found that daily weigh-ins helped people lose more weight, keep it off, and maintain that loss, even after two years. Use it to lose it. Step on the scale at the same time every day for  the best results. If your weight shoots up several pounds from one weigh-in to the next, don't freak out. Eating a lot of salt the night before or having your period is the likely culprit. The number should return to normal in a day or two. It's a steady climb that you need to do something about. 9. Too Much Stress and Too Little Sleep Are Your Enemies When you're tired and frazzled, your body cranks up the production of cortisol, the stress hormone that can cause carb cravings. Not getting enough sleep also boosts your levels of ghrelin, a hormone associated with hunger, while suppressing leptin, a hormone that signals fullness and satiety. People on a diet who slept only five and a half hours a night for two weeks lost 55 percent less fat and were hungrier than those who slept eight and a half hours, according to a study in the [place]. Use it to lose it. Prioritize sleep, aiming for seven hours or more a night, which research shows helps lower stress. And make sure you're getting quality zzz's. If a snoring spouse or a fidgety cat  wakes you up frequently throughout the night, you may end up getting the equivalent of just four hours of sleep, according to a study from [facility]. Keep pets out of the bedroom, and use a white-noise app to drown out snoring. 10. You Will Hit a plateau-And You Can Bust Through It As you slim down, your body releases much less leptin, the fullness hormone.  If you're not strength training, start right now. Building muscle can raise your metabolism to help you overcome a plateau. To keep your body challenged and burning calories, incorporate new moves and more intense intervals into your workouts or add another sweat session to your weekly routine. Alternatively, cut an extra 100 calories or so a day from your diet. Now that you've lost weight, your body simply doesn't need as much fuel.   We want weight loss that will last so you should lose 1-2 pounds a week.  THAT IS IT! Please pick THREE things a month to change. Once it is a habit check off the item. Then pick another three items off the list to become habits.  If you are already doing a habit on the list GREAT!  Cross that item off! o Don't drink your calories. Ie, alcohol, soda, fruit juice, and sweet tea.  o Drink more water. Drink a glass when you feel hungry or before each meal.  o Eat breakfast - Complex carb and protein (likeDannon light and fit yogurt, oatmeal, fruit, eggs, [place] bacon). o Measure your cereal.  Eat no more than one cup a day. (ie [place]) o Eat an apple a day. o Add a vegetable a day. o Try a new vegetable a month. o Use Pam! Stop using oil or butter to cook. o Don't finish your plate or use smaller plates. o Share your dessert. o Eat sugar free Jello for dessert or frozen grapes. o Don't eat 2-3 hours before bed. o Switch to whole wheat bread, pasta, and brown rice. o Make healthier choices when you eat out. No fries! o Pick baked chicken, NOT fried. o Don't forget to SLOW DOWN when you eat. It is not going  anywhere.  o Take the stairs. o Park far away in the parking lot o [employer] (or weights) for 10 minutes while watching TV. o Walk at work for 10 minutes during break. o  Walk outside 1 time a week with your friend, kids, dog, or significant other. o Start a walking group at [employer] as much as you can tolerate.  o Keep a food diary. o Weigh yourself daily. o Walk for 15 minutes 3 days per week. o Cook at home more often and eat out less.  If life happens and you go back to old habits, it is okay.  Just start over. You can do it!   If you experience chest pain, get short of breath, or tired during the exercise, please stop immediately and inform your doctor.   .    Bad carbs also include fruit juice, alcohol, and sweet tea. These are empty calories that do not signal to your brain that you are full.   Please remember the good carbs are still carbs which convert into sugar. So please measure them out no more than 1/2-1 cup of rice, oatmeal, pasta, and beans  Veggies are however free foods! Pile them on.   Not all fruit is created equal. Please see the list below, the fruit at the bottom is higher in sugars than the fruit at the top. Please avoid all dried fruits.

## 2014-08-25 NOTE — Progress Notes (Signed)
Assessment and Plan:  Hypertension: Continue medication, monitor blood pressure at home. Continue DASH diet.  Reminder to go to the ER if any CP, SOB, nausea, dizziness, severe HA, changes vision/speech, left arm numbness and tingling and jaw pain. Cholesterol: Continue diet and exercise. Check cholesterol.  Diabetes without complications-Continue diet and exercise. Check A1C Vitamin D Def- check level and continue medications.  Cough- continue allergy pill, if fever/chills call the office, if it continues will switch ACE to ARB. Obesity with co morbidities- long discussion about weight loss, diet, and exercise    Continue diet and meds as discussed. Further disposition pending results of labs. Discussed med's effects and SE's.    HPI 74 y.o. male  presents for 3 month follow up with hypertension, hyperlipidemia, diabetes and vitamin D.  His blood pressure has been controlled at home, today their BP is BP: 128/80 mmHg.  He does workout. He denies chest pain, shortness of breath, dizziness  He is on cholesterol medication, zocor 40mg  and denies myalgias. His cholesterol is at goal. The cholesterol was:  05/19/2014: Cholesterol, Total 162; HDL Cholesterol by NMR 78; LDL (calc) 72; Triglycerides 58  He has been working on diet and exercise for diabetes without complications, has had DM x 2005 but has done a good job with diet/exercise/Metformin bringing from DM range to preDM range, he is on bASA, he is on ACE/ARB, and denies  paresthesia of the feet, polydipsia, polyuria and visual disturbances. Last A1C was: 05/19/2014: Hemoglobin-A1c 5.7*  Patient is on Vitamin D supplement. 05/19/2014: Vit D, 25-Hydroxy 99*  Patient is on allopurinol for gout and does not report a recent flare.  Dry cough for 2-3 days, no productive, + post nasal drip, no fever, chills. On loratadine which is helping, if does not go away will switch ACE to ARB.  BMI is Body mass index is 38 kg/(m^2)., he is working on diet and  exercise. Wt Readings from Last 3 Encounters:  08/25/14 292 lb (132.45 kg)  07/21/14 289 lb (131.09 kg)  07/13/14 289 lb 9.6 oz (131.362 kg)     Current Medications:  Current Outpatient Prescriptions on File Prior to Visit  Medication Sig Dispense Refill  . allopurinol (ZYLOPRIM) 300 MG tablet TAKE ONE TABLET BY MOUTH EVERY DAY FOR GOUT 90 tablet 0  . ALPRAZolam (XANAX) 1 MG tablet Take 1/2 to 1 tablet 3 x daay if needed for anxiety 90 tablet 5  . aspirin (ASPIRIN LOW DOSE) 81 MG tablet Take 81 mg by mouth as needed.      . bumetanide (BUMEX) 2 MG tablet Take 1 tablet (2 mg total) by mouth 2 (two) times daily. 60 tablet 6  . Cholecalciferol (VITAMIN D PO) Take 5,000 Units by mouth. Takes 5000 iu on Mon and Thur and 10000 iu the other 5 days of week    . IRON PO Take 18 mg by mouth daily.    Marland Kitchen lisinopril (PRINIVIL,ZESTRIL) 20 MG tablet TAKE ONE TABLET BY MOUTH ONCE DAILY 90 tablet 99  . MAGNESIUM PO Take 500 mg by mouth daily.     . metFORMIN (GLUCOPHAGE XR) 500 MG 24 hr tablet Take 2 tablets twice daily for Diabetes 360 tablet 99  . simvastatin (ZOCOR) 40 MG tablet TAKE ONE TABLET BY MOUTH AT BEDTIME FOR CHOLESTEROL 90 tablet 1   No current facility-administered medications on file prior to visit.   Medical History:  Past Medical History  Diagnosis Date  . Type II or unspecified type diabetes mellitus without  mention of complication, not stated as uncontrolled   . Hyperlipidemia   . Hypertension   . Gout   . Vitamin D deficiency   . Adenomatous colon polyp     2010  . Prostate cancer    Allergies: No Known Allergies   Review of Systems:  Review of Systems  Constitutional: Negative.   HENT: Positive for congestion. Negative for ear discharge, ear pain, hearing loss, nosebleeds, sore throat and tinnitus.   Eyes: Negative.   Respiratory: Positive for cough. Negative for hemoptysis, sputum production, shortness of breath, wheezing and stridor.   Cardiovascular: Negative.    Gastrointestinal: Negative.   Genitourinary: Negative.        Occ incontinence with bumex  Musculoskeletal: Positive for joint pain. Negative for myalgias, back pain, falls and neck pain.  Skin: Negative.   Neurological: Negative.  Negative for headaches.  Endo/Heme/Allergies: Negative.   Psychiatric/Behavioral: Negative.     Family history- Review and unchanged Social history- Review and unchanged Physical Exam: BP 128/80 mmHg  Pulse 68  Temp(Src) 97.9 F (36.6 C)  Resp 16  Ht 6' 1.5" (1.867 m)  Wt 292 lb (132.45 kg)  BMI 38.00 kg/m2 Wt Readings from Last 3 Encounters:  08/25/14 292 lb (132.45 kg)  07/21/14 289 lb (131.09 kg)  07/13/14 289 lb 9.6 oz (131.362 kg)   General Appearance: Well nourished, in no apparent distress. Eyes: PERRLA, EOMs, conjunctiva no swelling or erythema Sinuses: No Frontal/maxillary tenderness ENT/Mouth: Ext aud canals clear, TMs without erythema, bulging. No erythema, swelling, or exudate on post pharynx.  Tonsils not swollen or erythematous. Hearing normal.  Neck: Supple, thyroid normal.  Respiratory: Respiratory effort normal, BS equal bilaterally without rales, rhonchi, wheezing or stridor.  Cardio: RRR with no MRGs. Brisk peripheral pulses with 2+ edema.  Abdomen: Soft, + BS, obese  Non tender, no guarding, rebound, hernias, masses. Lymphatics: Non tender without lymphadenopathy.  Musculoskeletal: Full ROM, 5/5 strength, Normal gait Skin: Warm, dry without rashes, lesions, ecchymosis.  Neuro: Cranial nerves intact. No cerebellar symptoms.  Psych: Awake and oriented X 3, normal affect, Insight and Judgment appropriate.    Vicie Mutters, PA-C 10:52 AM [facility]

## 2014-08-26 LAB — CBC WITH DIFFERENTIAL/PLATELET
BASOS ABS: 0.1 10*3/uL (ref 0.0–0.1)
BASOS PCT: 1 % (ref 0–1)
Eosinophils Absolute: 0.2 10*3/uL (ref 0.0–0.7)
Eosinophils Relative: 4 % (ref 0–5)
HCT: 38.8 % — ABNORMAL LOW (ref 39.0–52.0)
Hemoglobin: 12.3 g/dL — ABNORMAL LOW (ref 13.0–17.0)
Lymphocytes Relative: 17 % (ref 12–46)
Lymphs Abs: 1 10*3/uL (ref 0.7–4.0)
MCH: 25.7 pg — ABNORMAL LOW (ref 26.0–34.0)
MCHC: 31.7 g/dL (ref 30.0–36.0)
MCV: 81.2 fL (ref 78.0–100.0)
MPV: 10.2 fL (ref 8.6–12.4)
Monocytes Absolute: 0.4 10*3/uL (ref 0.1–1.0)
Monocytes Relative: 7 % (ref 3–12)
NEUTROS ABS: 4 10*3/uL (ref 1.7–7.7)
NEUTROS PCT: 71 % (ref 43–77)
PLATELETS: 204 10*3/uL (ref 150–400)
RBC: 4.78 MIL/uL (ref 4.22–5.81)
RDW: 15.4 % (ref 11.5–15.5)
WBC: 5.6 10*3/uL (ref 4.0–10.5)

## 2014-08-26 LAB — INSULIN, FASTING: Insulin fasting, serum: 5.6 u[IU]/mL (ref 2.0–19.6)

## 2014-09-06 ENCOUNTER — Other Ambulatory Visit: Payer: Self-pay | Admitting: Internal Medicine

## 2014-10-05 ENCOUNTER — Other Ambulatory Visit: Payer: Self-pay | Admitting: Internal Medicine

## 2014-10-08 DIAGNOSIS — E119 Type 2 diabetes mellitus without complications: Secondary | ICD-10-CM | POA: Diagnosis not present

## 2014-10-08 DIAGNOSIS — H2513 Age-related nuclear cataract, bilateral: Secondary | ICD-10-CM | POA: Diagnosis not present

## 2014-10-25 ENCOUNTER — Other Ambulatory Visit: Payer: Self-pay | Admitting: *Deleted

## 2014-11-10 ENCOUNTER — Telehealth: Payer: Self-pay | Admitting: *Deleted

## 2014-11-10 MED ORDER — CIPROFLOXACIN HCL 500 MG PO TABS: 500.0000 mg | ORAL_TABLET | Freq: Two times a day (BID) | ORAL | Status: AC

## 2014-11-10 NOTE — Telephone Encounter (Signed)
Patient called and states he is having burning when he urinates.  OK to send in RX for Cipro per Dr Melford Aase.

## 2014-11-25 ENCOUNTER — Encounter: Payer: Self-pay | Admitting: Internal Medicine

## 2014-11-25 ENCOUNTER — Ambulatory Visit (INDEPENDENT_AMBULATORY_CARE_PROVIDER_SITE_OTHER): Payer: Medicare Other | Admitting: Internal Medicine

## 2014-11-25 VITALS — BP 116/84 | HR 60 | Temp 97.2°F | Resp 16 | Ht 73.5 in | Wt 291.2 lb

## 2014-11-25 DIAGNOSIS — Z79899 Other long term (current) drug therapy: Secondary | ICD-10-CM | POA: Diagnosis not present

## 2014-11-25 DIAGNOSIS — E559 Vitamin D deficiency, unspecified: Secondary | ICD-10-CM | POA: Diagnosis not present

## 2014-11-25 DIAGNOSIS — I10 Essential (primary) hypertension: Secondary | ICD-10-CM

## 2014-11-25 DIAGNOSIS — E119 Type 2 diabetes mellitus without complications: Secondary | ICD-10-CM

## 2014-11-25 DIAGNOSIS — E785 Hyperlipidemia, unspecified: Secondary | ICD-10-CM | POA: Diagnosis not present

## 2014-11-25 LAB — CBC WITH DIFFERENTIAL/PLATELET
BASOS ABS: 0.1 10*3/uL (ref 0.0–0.1)
BASOS PCT: 1 % (ref 0–1)
EOS PCT: 4 % (ref 0–5)
Eosinophils Absolute: 0.2 10*3/uL (ref 0.0–0.7)
HEMATOCRIT: 42.3 % (ref 39.0–52.0)
HEMOGLOBIN: 13.7 g/dL (ref 13.0–17.0)
Lymphocytes Relative: 29 % (ref 12–46)
Lymphs Abs: 1.5 10*3/uL (ref 0.7–4.0)
MCH: 26.2 pg (ref 26.0–34.0)
MCHC: 32.4 g/dL (ref 30.0–36.0)
MCV: 80.9 fL (ref 78.0–100.0)
MPV: 10 fL (ref 8.6–12.4)
Monocytes Absolute: 0.4 10*3/uL (ref 0.1–1.0)
Monocytes Relative: 7 % (ref 3–12)
Neutro Abs: 3.1 10*3/uL (ref 1.7–7.7)
Neutrophils Relative %: 59 % (ref 43–77)
Platelets: 235 10*3/uL (ref 150–400)
RBC: 5.23 MIL/uL (ref 4.22–5.81)
RDW: 15.1 % (ref 11.5–15.5)
WBC: 5.2 10*3/uL (ref 4.0–10.5)

## 2014-11-25 NOTE — Patient Instructions (Signed)

## 2014-11-25 NOTE — Progress Notes (Signed)
Patient ID: Marcus Chambers, male   DOB: December 25, 1940, 74 y.o.   MRN: [id number]   This very nice 74 y.o.male presents for 3 month follow up with Hypertension, Hyperlipidemia, Morbid Obesity, T2_NIDDM, Gout  and Vitamin D Deficiency.    Patient is treated for HTN since 1988  & BP has been controlled at home. Today's BP: 116/84 mmHg. Patient has had no complaints of any cardiac type chest pain, palpitations, dyspnea/orthopnea/PND, dizziness, claudication, or dependent edema. Total  Hyperlipidemia is controlled with diet & meds. Patient denies myalgias or other med SE's. Last Lipids wereChol149; HDL 66; LDL  76; Trig 36 on  08/25/2014.   Also, the patient has history of T2_NIDDM since 2005 and has had no symptoms of reactive hypoglycemia, diabetic polys, paresthesias or visual blurring. He reports FBG's range 70-90's.   Last A1c was  6.2% on 08/25/2014.   Further, the patient also has history of Vitamin D Deficiency of 29 in 2008 and supplements vitamin D without any suspected side-effects. Last vitamin D was  99 on 05/19/2014.  Medication Sig  . allopurinol ( 300 MG tablet TAKE ONE TABLET BY MOUTH ONCE DAILY FOR GOUT  . ALPRAZolam 1 MG tablet Take 1/2 to 1 tablet 3 x daay if needed for anxiety  . ASPIRIN LOW DOSE 81 MG Take 81 mg by mouth as needed.    . bumetanide (BUMEX) 2 MG tablet Take 1 tablet (2 mg total) by mouth 2 (two) times daily.  . Cholecalciferol (VITAMIN D PO) Take 5,000 Units by mouth. Takes 5000 iu on Mon and Thur and 10000 iu the other 5 days of week  . IRON PO Take 18 mg by mouth daily.  Marland Kitchen lisinopril 20 MG tablet TAKE ONE TABLET BY MOUTH ONCE DAILY  . MAGNESIUM PO Take 500 mg by mouth 2 (two) times daily.   . metFORMIN  XR 500 MG  Take 2 tablets twice daily for Diabetes  . simvastatin  40 MG tablet TAKE ONE TABLET BY MOUTH AT BEDTIME FOR CHOLESTEROL   No Known Allergies  PMHx:   Past Medical History  Diagnosis Date  . Type II or unspecified type diabetes mellitus without mention  of complication, not stated as uncontrolled   . Hyperlipidemia   . Hypertension   . Gout   . Vitamin D deficiency   . Adenomatous colon polyp     2010  . Prostate cancer    Immunization History  Administered Date(s) Administered  . DT 03/21/2012  . Influenza-Unspecified 04/16/2014  . Pneumococcal Conjugate-13 05/19/2014  . Pneumococcal Polysaccharide-23 07/24/2007   Past Surgical History  Procedure Laterality Date  . Appendectomy  1994    and GALLBLADDER  . Cholecystectomy  1994  . Knee surgery  1996  . Colovesical fistula repair  01/2011   FHx:    Reviewed / unchanged  SHx:    Reviewed / unchanged  Systems Review:  Constitutional: Denies fever, chills, wt changes, headaches, insomnia, fatigue, night sweats, change in appetite. Eyes: Denies redness, blurred vision, diplopia, discharge, itchy, watery eyes.  ENT: Denies discharge, congestion, post nasal drip, epistaxis, sore throat, earache, hearing loss, dental pain, tinnitus, vertigo, sinus pain, snoring.  CV: Denies chest pain, palpitations, irregular heartbeat, syncope, dyspnea, diaphoresis, orthopnea, PND, claudication or edema. Respiratory: denies cough, dyspnea, DOE, pleurisy, hoarseness, laryngitis, wheezing.  Gastrointestinal: Denies dysphagia, odynophagia, heartburn, reflux, water brash, abdominal pain or cramps, nausea, vomiting, bloating, diarrhea, constipation, hematemesis, melena, hematochezia  or hemorrhoids. Genitourinary: Denies dysuria, frequency, urgency, nocturia, hesitancy,  discharge, hematuria or flank pain. Musculoskeletal: Denies arthralgias, myalgias, stiffness, jt. swelling, pain, limping or strain/sprain.  Skin: Denies pruritus, rash, hives, warts, acne, eczema or change in skin lesion(s). Neuro: No weakness, tremor, incoordination, spasms, paresthesia or pain. Psychiatric: Denies confusion, memory loss or sensory loss. Endo: Denies change in weight, skin or hair change.  Heme/Lymph: No excessive  bleeding, bruising or enlarged lymph nodes.  Physical Exam  BP 116/84   Pulse 60  Temp 97.2 F Resp 16  Ht 6' 1.5" Wt 291 lb 3.2 oz     BMI 37.89   Appears well nourished and in no distress. Eyes: PERRLA, EOMs, conjunctiva no swelling or erythema. Sinuses: No frontal/maxillary tenderness ENT/Mouth: EAC's clear, TM's nl w/o erythema, bulging. Nares clear w/o erythema, swelling, exudates. Oropharynx clear without erythema or exudates. Oral hygiene is good. Tongue normal, non obstructing. Hearing intact.  Neck: Supple. Thyroid nl. Car 2+/2+ without bruits, nodes or JVD. Chest: Respirations nl with BS clear & equal w/o rales, rhonchi, wheezing or stridor.  Cor: Heart sounds normal w/ regular rate and rhythm without sig. murmurs, gallops, clicks, or rubs. Peripheral pulses normal and equal  without edema.  Abdomen: Soft & bowel sounds normal. Non-tender w/o guarding, rebound, hernias, masses, or organomegaly.  Lymphatics: Unremarkable.  Musculoskeletal: Full ROM all peripheral extremities, joint stability, 5/5 strength, and normal gait.  Skin: Warm, dry without exposed rashes, lesions or ecchymosis apparent.  Neuro: Cranial nerves intact, reflexes equal bilaterally. Sensory-motor testing grossly intact. Tendon reflexes grossly intact.  Pysch: Alert & oriented x 3.  Insight and judgement nl & appropriate. No ideations.  Assessment and Plan:  1. Essential hypertension  - TSH  2. Hyperlipidemia  - Lipid panel  3. Diabetes mellitus without complication  - Hemoglobin A1c - Insulin, random  4. Vitamin D deficiency  - Vit D  25 hydroxy (rtn osteoporosis monitoring)  5. Obesity   6. Medication management  - CBC with Differential/Platelet - BASIC METABOLIC PANEL WITH GFR - Hepatic function panel - Magnesium   Recommended regular exercise, BP monitoring, weight control, and discussed med and SE's. Recommended labs to assess and monitor clinical status. Further disposition  pending results of labs. Over 30 minutes of exam, counseling, chart review was performed

## 2014-11-26 LAB — BASIC METABOLIC PANEL WITH GFR
BUN: 23 mg/dL (ref 6–23)
CALCIUM: 10.3 mg/dL (ref 8.4–10.5)
CHLORIDE: 100 meq/L (ref 96–112)
CO2: 25 mEq/L (ref 19–32)
Creat: 1.11 mg/dL (ref 0.50–1.35)
GFR, EST AFRICAN AMERICAN: 76 mL/min
GFR, EST NON AFRICAN AMERICAN: 66 mL/min
Glucose, Bld: 95 mg/dL (ref 70–99)
Potassium: 4.1 mEq/L (ref 3.5–5.3)
Sodium: 137 mEq/L (ref 135–145)

## 2014-11-26 LAB — LIPID PANEL
Cholesterol: 157 mg/dL (ref 0–200)
HDL: 87 mg/dL (ref >=40)
LDL Cholesterol: 58 mg/dL (ref 0–99)
TRIGLYCERIDES: 62 mg/dL (ref <150)
Total CHOL/HDL Ratio: 1.8 Ratio
VLDL: 12 mg/dL (ref 0–40)

## 2014-11-26 LAB — HEPATIC FUNCTION PANEL
ALK PHOS: 53 U/L (ref 39–117)
ALT: 17 U/L (ref 0–53)
AST: 19 U/L (ref 0–37)
Albumin: 4.3 g/dL (ref 3.5–5.2)
BILIRUBIN DIRECT: 0.2 mg/dL (ref 0.0–0.3)
BILIRUBIN INDIRECT: 0.5 mg/dL (ref 0.2–1.2)
Total Bilirubin: 0.7 mg/dL (ref 0.2–1.2)
Total Protein: 7.6 g/dL (ref 6.0–8.3)

## 2014-11-26 LAB — HEMOGLOBIN A1C
HEMOGLOBIN A1C: 6.1 % — AB (ref <5.7)
MEAN PLASMA GLUCOSE: 128 mg/dL — AB (ref <117)

## 2014-11-26 LAB — TSH: TSH: 1.095 u[IU]/mL (ref 0.350–4.500)

## 2014-11-26 LAB — VITAMIN D 25 HYDROXY (VIT D DEFICIENCY, FRACTURES): Vit D, 25-Hydroxy: 80 ng/mL (ref 30–100)

## 2014-11-26 LAB — INSULIN, RANDOM: INSULIN: 8.2 u[IU]/mL (ref 2.0–19.6)

## 2014-11-26 LAB — MAGNESIUM: MAGNESIUM: 1.7 mg/dL (ref 1.5–2.5)

## 2014-12-01 ENCOUNTER — Telehealth: Payer: Self-pay | Admitting: *Deleted

## 2014-12-01 NOTE — Telephone Encounter (Signed)
Due to low magnesium level, patient needs to increase his Magnesium 500 mg to 2 tabs twice a day per Dr Melford Aase.  Patient aware.

## 2014-12-06 ENCOUNTER — Other Ambulatory Visit: Payer: Self-pay | Admitting: Internal Medicine

## 2015-02-25 ENCOUNTER — Encounter: Payer: Self-pay | Admitting: Internal Medicine

## 2015-02-25 ENCOUNTER — Ambulatory Visit (INDEPENDENT_AMBULATORY_CARE_PROVIDER_SITE_OTHER): Payer: Medicare Other | Admitting: Internal Medicine

## 2015-02-25 VITALS — BP 142/76 | HR 68 | Temp 98.0°F | Resp 18 | Ht 73.5 in | Wt 290.0 lb

## 2015-02-25 DIAGNOSIS — Z79899 Other long term (current) drug therapy: Secondary | ICD-10-CM

## 2015-02-25 DIAGNOSIS — I10 Essential (primary) hypertension: Secondary | ICD-10-CM | POA: Diagnosis not present

## 2015-02-25 DIAGNOSIS — E119 Type 2 diabetes mellitus without complications: Secondary | ICD-10-CM

## 2015-02-25 DIAGNOSIS — E785 Hyperlipidemia, unspecified: Secondary | ICD-10-CM | POA: Diagnosis not present

## 2015-02-25 DIAGNOSIS — E559 Vitamin D deficiency, unspecified: Secondary | ICD-10-CM | POA: Diagnosis not present

## 2015-02-25 LAB — CBC WITH DIFFERENTIAL/PLATELET
Basophils Absolute: 0.1 10*3/uL (ref 0.0–0.1)
Basophils Relative: 1 % (ref 0–1)
Eosinophils Absolute: 0.1 10*3/uL (ref 0.0–0.7)
Eosinophils Relative: 2 % (ref 0–5)
HEMATOCRIT: 37.9 % — AB (ref 39.0–52.0)
Hemoglobin: 12.9 g/dL — ABNORMAL LOW (ref 13.0–17.0)
LYMPHS ABS: 1.4 10*3/uL (ref 0.7–4.0)
Lymphocytes Relative: 27 % (ref 12–46)
MCH: 26.9 pg (ref 26.0–34.0)
MCHC: 34 g/dL (ref 30.0–36.0)
MCV: 79 fL (ref 78.0–100.0)
MONOS PCT: 7 % (ref 3–12)
MPV: 9.9 fL (ref 8.6–12.4)
Monocytes Absolute: 0.4 10*3/uL (ref 0.1–1.0)
NEUTROS PCT: 63 % (ref 43–77)
Neutro Abs: 3.2 10*3/uL (ref 1.7–7.7)
Platelets: 222 10*3/uL (ref 150–400)
RBC: 4.8 MIL/uL (ref 4.22–5.81)
RDW: 15.3 % (ref 11.5–15.5)
WBC: 5 10*3/uL (ref 4.0–10.5)

## 2015-02-25 LAB — TSH: TSH: 1.092 u[IU]/mL (ref 0.350–4.500)

## 2015-02-25 LAB — HEPATIC FUNCTION PANEL
ALT: 14 U/L (ref 9–46)
AST: 16 U/L (ref 10–35)
Albumin: 4.1 g/dL (ref 3.6–5.1)
Alkaline Phosphatase: 59 U/L (ref 40–115)
BILIRUBIN DIRECT: 0.1 mg/dL (ref <=0.2)
Indirect Bilirubin: 0.5 mg/dL (ref 0.2–1.2)
Total Bilirubin: 0.6 mg/dL (ref 0.2–1.2)
Total Protein: 7.4 g/dL (ref 6.1–8.1)

## 2015-02-25 LAB — LIPID PANEL
CHOL/HDL RATIO: 2 ratio (ref <=5.0)
Cholesterol: 157 mg/dL (ref 125–200)
HDL: 79 mg/dL (ref >=40)
LDL CALC: 68 mg/dL (ref <130)
Triglycerides: 52 mg/dL (ref <150)
VLDL: 10 mg/dL (ref <30)

## 2015-02-25 LAB — BASIC METABOLIC PANEL WITH GFR
BUN: 14 mg/dL (ref 7–25)
CALCIUM: 9.8 mg/dL (ref 8.6–10.3)
CO2: 25 mmol/L (ref 20–31)
Chloride: 105 mmol/L (ref 98–110)
Creat: 1 mg/dL (ref 0.70–1.18)
GFR, EST AFRICAN AMERICAN: 86 mL/min (ref >=60)
GFR, EST NON AFRICAN AMERICAN: 74 mL/min (ref >=60)
GLUCOSE: 100 mg/dL — AB (ref 65–99)
Potassium: 4.2 mmol/L (ref 3.5–5.3)
SODIUM: 139 mmol/L (ref 135–146)

## 2015-02-25 LAB — HEMOGLOBIN A1C
HEMOGLOBIN A1C: 6.3 % — AB (ref <5.7)
MEAN PLASMA GLUCOSE: 134 mg/dL — AB (ref <117)

## 2015-02-25 LAB — MAGNESIUM: Magnesium: 1.7 mg/dL (ref 1.5–2.5)

## 2015-02-25 NOTE — Progress Notes (Signed)
Patient ID: Marcus Chambers, male   DOB: 04-Aug-1940, 74 y.o.   MRN: [id number]  Assessment and Plan:  Hypertension:  -Continue medication -monitor blood pressure at home. -Continue DASH diet -Reminder to go to the ER if any CP, SOB, nausea, dizziness, severe HA, changes vision/speech, left arm numbness and tingling and jaw pain.  Cholesterol - Continue diet and exercise -Check cholesterol.   Diabetes without complications -Continue diet and exercise.  -Check A1C  Vitamin D Def -check level -continue medications.   Continue diet and meds as discussed. Further disposition pending results of labs. Discussed med's effects and SE's.    HPI 74 y.o. male  presents for 3 month follow up with hypertension, hyperlipidemia, diabetes and vitamin D deficiency.   His blood pressure has been controlled at home, today their BP is BP: (!) 148/70 mmHg.He does not workout. He denies chest pain, shortness of breath, dizziness. 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  He reports that he checks his blood pressure at home daily and it is usually 120/60.  He reports that he is religious about checking it but it is always a little higher when he is here at the office.     He is on cholesterol medication and denies myalgias. His cholesterol is at goal. The cholesterol was:  11/25/2014: Cholesterol 157; HDL 87; LDL Cholesterol 58; Triglycerides 62   He has been working on diet and exercise for diabetes without complications, he is on bASA, he is on ACE/ARB, and denies  foot ulcerations, hyperglycemia, hypoglycemia , increased appetite, nausea, paresthesia of the feet, polydipsia, polyuria, visual disturbances, vomiting and weight loss. Last A1C was: 11/25/2014: Hgb A1c MFr Bld 6.1*.  He reports that his blood sugars are running at a normal range.     Patient is on Vitamin D supplement. 11/25/2014: Vit D, 25-Hydroxy 80    Current Medications:  Current Outpatient  Prescriptions on File Prior to Visit  Medication Sig Dispense Refill  . allopurinol (ZYLOPRIM) 300 MG tablet TAKE ONE TABLET BY MOUTH ONCE DAILY FOR GOUT 90 tablet 0  . aspirin (ASPIRIN LOW DOSE) 81 MG tablet Take 81 mg by mouth as needed.      . bumetanide (BUMEX) 2 MG tablet Take 1 tablet (2 mg total) by mouth 2 (two) times daily. 60 tablet 6  . Cholecalciferol (VITAMIN D PO) Take 5,000 Units by mouth. Takes 5000 iu on Mon and Thur and 10000 iu the other 5 days of week    . IRON PO Take 18 mg by mouth daily.    Marland Kitchen lisinopril (PRINIVIL,ZESTRIL) 20 MG tablet TAKE ONE TABLET BY MOUTH ONCE DAILY 90 tablet 99  . MAGNESIUM PO Take 500 mg by mouth 2 (two) times daily.     . metFORMIN (GLUCOPHAGE-XR) 500 MG 24 hr tablet TAKE TWO TABLETS BY MOUTH TWICE DAILY FOR DIABETES 360 tablet 1  . simvastatin (ZOCOR) 40 MG tablet TAKE ONE TABLET BY MOUTH AT BEDTIME FOR CHOLESTEROL 90 tablet 1   No current facility-administered medications on file prior to visit.   Medical History:  Past Medical History  Diagnosis Date  . Type II or unspecified type diabetes mellitus without mention of complication, not stated as uncontrolled   . Hyperlipidemia   . Hypertension   . Gout   . Vitamin D deficiency   . Adenomatous colon polyp     2010  . Prostate cancer    Allergies: No Known  Allergies   Review of Systems:  Review of Systems  Constitutional: Negative for fever, chills and malaise/fatigue.  HENT: Negative for congestion, ear pain and sore throat.   Eyes: Negative.   Respiratory: Negative for cough, shortness of breath and wheezing.   Cardiovascular: Negative for chest pain, palpitations and leg swelling.  Gastrointestinal: Negative for heartburn, diarrhea, constipation, blood in stool and melena.  Genitourinary: Negative.   Skin: Negative.   Neurological: Negative for dizziness, sensory change, loss of consciousness and headaches.  Psychiatric/Behavioral: Negative for depression. The patient is not  nervous/anxious and does not have insomnia.     Family history- Review and unchanged  Social history- Review and unchanged  Physical Exam: BP 148/70 mmHg  Pulse 68  Temp(Src) 98 F (36.7 C) (Temporal)  Resp 18  Ht 6' 1.5" (1.867 m)  Wt 290 lb (131.543 kg)  BMI 37.74 kg/m2 Wt Readings from Last 3 Encounters:  02/25/15 290 lb (131.543 kg)  11/25/14 291 lb 3.2 oz (132.087 kg)  08/25/14 292 lb (132.45 kg)   General Appearance: Well nourished well developed, non-toxic appearing, in no apparent distress. Eyes: PERRLA, EOMs, conjunctiva no swelling or erythema ENT/Mouth: Ear canals clear with no erythema, swelling, or discharge.  TMs normal bilaterally, oropharynx clear, moist, with no exudate.   Neck: Supple, thyroid normal, no JVD, no cervical adenopathy.  Respiratory: Respiratory effort normal, breath sounds clear A&P, no wheeze, rhonchi or rales noted.  No retractions, no accessory muscle usage Cardio: RRR with no MRGs. No noted edema.  Abdomen: Soft, + BS.  Non tender, no guarding, rebound, hernias, masses. Musculoskeletal: Full ROM, 5/5 strength, Normal gait Skin: Warm, dry without rashes, lesions, ecchymosis.  Neuro: Awake and oriented X 3, Cranial nerves intact. No cerebellar symptoms.  Psych: normal affect, Insight and Judgment appropriate.    Starlyn Skeans, PA-C 10:47 AM [facility]

## 2015-02-25 NOTE — Patient Instructions (Signed)

## 2015-02-26 ENCOUNTER — Ambulatory Visit: Payer: Self-pay | Admitting: Internal Medicine

## 2015-02-26 LAB — INSULIN, RANDOM: Insulin: 10.1 u[IU]/mL (ref 2.0–19.6)

## 2015-02-26 LAB — VITAMIN D 25 HYDROXY (VIT D DEFICIENCY, FRACTURES): VIT D 25 HYDROXY: 71 ng/mL (ref 30–100)

## 2015-04-15 ENCOUNTER — Other Ambulatory Visit: Payer: Self-pay | Admitting: Internal Medicine

## 2015-05-04 DIAGNOSIS — Z8546 Personal history of malignant neoplasm of prostate: Secondary | ICD-10-CM | POA: Diagnosis not present

## 2015-05-23 ENCOUNTER — Other Ambulatory Visit: Payer: Self-pay | Admitting: Internal Medicine

## 2015-05-26 ENCOUNTER — Encounter: Payer: Self-pay | Admitting: Internal Medicine

## 2015-05-27 ENCOUNTER — Other Ambulatory Visit: Payer: Self-pay | Admitting: *Deleted

## 2015-05-27 MED ORDER — LISINOPRIL 20 MG PO TABS: 20.0000 mg | ORAL_TABLET | Freq: Every day | ORAL | Status: DC

## 2015-06-01 ENCOUNTER — Other Ambulatory Visit: Payer: Self-pay | Admitting: Internal Medicine

## 2015-06-07 ENCOUNTER — Other Ambulatory Visit: Payer: Self-pay | Admitting: *Deleted

## 2015-06-07 MED ORDER — METFORMIN HCL ER 500 MG PO TB24: ORAL_TABLET | ORAL | Status: DC

## 2015-06-28 ENCOUNTER — Ambulatory Visit (INDEPENDENT_AMBULATORY_CARE_PROVIDER_SITE_OTHER): Payer: Medicare Other | Admitting: Internal Medicine

## 2015-06-28 ENCOUNTER — Encounter: Payer: Self-pay | Admitting: Internal Medicine

## 2015-06-28 VITALS — BP 144/98 | HR 60 | Temp 97.5°F | Resp 16 | Ht 74.0 in | Wt 279.6 lb

## 2015-06-28 DIAGNOSIS — Z789 Other specified health status: Secondary | ICD-10-CM

## 2015-06-28 DIAGNOSIS — R6889 Other general symptoms and signs: Secondary | ICD-10-CM | POA: Diagnosis not present

## 2015-06-28 DIAGNOSIS — Z125 Encounter for screening for malignant neoplasm of prostate: Secondary | ICD-10-CM

## 2015-06-28 DIAGNOSIS — Z79899 Other long term (current) drug therapy: Secondary | ICD-10-CM | POA: Diagnosis not present

## 2015-06-28 DIAGNOSIS — Z1389 Encounter for screening for other disorder: Secondary | ICD-10-CM | POA: Diagnosis not present

## 2015-06-28 DIAGNOSIS — M109 Gout, unspecified: Secondary | ICD-10-CM | POA: Diagnosis not present

## 2015-06-28 DIAGNOSIS — M1000 Idiopathic gout, unspecified site: Secondary | ICD-10-CM

## 2015-06-28 DIAGNOSIS — I10 Essential (primary) hypertension: Secondary | ICD-10-CM

## 2015-06-28 DIAGNOSIS — N320 Bladder-neck obstruction: Secondary | ICD-10-CM | POA: Diagnosis not present

## 2015-06-28 DIAGNOSIS — E559 Vitamin D deficiency, unspecified: Secondary | ICD-10-CM

## 2015-06-28 DIAGNOSIS — Z0001 Encounter for general adult medical examination with abnormal findings: Secondary | ICD-10-CM

## 2015-06-28 DIAGNOSIS — Z1212 Encounter for screening for malignant neoplasm of rectum: Secondary | ICD-10-CM

## 2015-06-28 DIAGNOSIS — E785 Hyperlipidemia, unspecified: Secondary | ICD-10-CM

## 2015-06-28 DIAGNOSIS — Z9181 History of falling: Secondary | ICD-10-CM

## 2015-06-28 DIAGNOSIS — Z1331 Encounter for screening for depression: Secondary | ICD-10-CM

## 2015-06-28 DIAGNOSIS — E119 Type 2 diabetes mellitus without complications: Secondary | ICD-10-CM | POA: Diagnosis not present

## 2015-06-28 MED ORDER — BISOPROLOL-HYDROCHLOROTHIAZIDE 5-6.25 MG PO TABS: 1.0000 | ORAL_TABLET | Freq: Every day | ORAL | Status: DC

## 2015-06-28 NOTE — Patient Instructions (Signed)

## 2015-06-28 NOTE — Progress Notes (Signed)
Patient ID: [name], male   DOB: 1941/07/05, 74 y.o.   MRN: [id number]   [facility] VISIT AND CPE  Assessment:    1. Essential hypertension -PVCs on ekg and HTN here will add in ziac 5mg  at bedtime - Microalbumin / creatinine urine ratio - EKG 12-Lead - [place], RETROPERITNL ABD,  LTD - TSH  2. Hyperlipidemia -cont meds - Lipid panel - TSH  3. Diabetes mellitus without complication (HCC)  - HM DIABETES FOOT EXAM - LOW EXTREMITY NEUR EXAM DOCUM - Hemoglobin A1c - Insulin, random  4. Vitamin D deficiency -cont meds - VITAMIN D 25 Hydroxy (Vit-D Deficiency, Fractures)  5. Idiopathic gout, unspecified chronicity, unspecified site  - Uric acid  6. Morbid obesity, unspecified obesity type ([place]) -discussed diet and exercise  7. Screening for rectal cancer  - POC Hemoccult Bld/Stl (3-Cd Home Screen); Future  8. Prostate cancer screening -getting checked by Dr. Risa Grill - PSA  9. Depression screen -negative  10. At low risk for fall   11. Medication management  - Urinalysis, Routine w reflex microscopic (not at [facility]) - CBC with Differential/Platelet - BASIC METABOLIC PANEL WITH GFR - Hepatic function panel - Magnesium  12. Bladder neck obstruction  - PSA   Over 40 minutes of exam, counseling, chart review and critical decision making was performed  Plan:   During the course of the visit the patient was educated and counseled about appropriate screening and preventive services including:    Pneumococcal vaccine  Prevnar 13   Influenza vaccine  Td vaccine  Screening electrocardiogram  Bone densitometry screening  Colorectal cancer screening  Diabetes screening  Glaucoma screening  Nutrition counseling   Advanced directives: requested  Conditions/risks identified: BMI: Discussed weight loss, diet, and increase physical activity.  Increase physical activity: AHA recommends 150 minutes of physical activity a week.   Medications reviewed Diabetes is at goal, ACE/ARB therapy: Yes. Urinary Incontinence is not an issue: discussed non pharmacology and pharmacology options.  Fall risk: low- discussed PT, home fall assessment, medications.    Subjective:  Marcus Chambers is a 74 y.o. male who presents for Medicare Annual Wellness Visit and complete physical.  Date of last medicare wellness visit is unknown.  He has had elevated blood pressure controlled with medications. His blood pressure has been controlled at home, today their BP is BP: (!) 144/98 mmHg He does workout. He denies chest pain, shortness of breath, dizziness. He rpeorts that he checks BP at home and it generally runs 127/90 at home. He is walking regularly.  He repors that [place]  He is on cholesterol medication and denies myalgias. His cholesterol is at goal. The cholesterol last visit was:   Lab Results  Component Value Date   CHOL 157 02/25/2015   HDL 79 02/25/2015   LDLCALC 68 02/25/2015   TRIG 52 02/25/2015   CHOLHDL 2.0 02/25/2015   He has had prediabetes for  years. He has been working on diet and exercise for prediabetes, and denies foot ulcerations, hyperglycemia, hypoglycemia , increased appetite, nausea, paresthesia of the feet, polydipsia, polyuria, visual disturbances, vomiting and weight loss. Last A1C in the office was:  Lab Results  Component Value Date   HGBA1C 6.3* 02/25/2015   Last GFR: NonAA   Lab Results  Component Value Date   [facility] 74 02/25/2015   AA  Lab Results  Component Value Date   GFRAA 86 02/25/2015   Patient is on Vitamin  D supplement.   Lab Results  Component Value Date   VD25OH 71 02/25/2015       Medication Review: Current Outpatient Prescriptions on File Prior to Visit  Medication Sig Dispense Refill  . allopurinol (ZYLOPRIM) 300 MG tablet TAKE ONE TABLET BY MOUTH ONCE DAILY FOR GOUT 90 tablet 1  . aspirin (ASPIRIN LOW DOSE) 81 MG tablet Take 81  mg by mouth as needed.      . bumetanide (BUMEX) 2 MG tablet Take 1 tablet (2 mg total) by mouth 2 (two) times daily. 60 tablet 6  . Cholecalciferol (VITAMIN D PO) Take 5,000 Units by mouth. Takes 5000 iu on Mon and Thur and 10000 iu the other 5 days of week    . IRON PO Take 18 mg by mouth daily.    Marland Kitchen lisinopril (PRINIVIL,ZESTRIL) 20 MG tablet Take 1 tablet (20 mg total) by mouth daily. 90 tablet 1  . MAGNESIUM PO Take 500 mg by mouth 2 (two) times daily.     . metFORMIN (GLUCOPHAGE-XR) 500 MG 24 hr tablet TAKE TWO TABLETS BY MOUTH TWICE DAILY FOR DIABETES 360 tablet 1  . simvastatin (ZOCOR) 40 MG tablet TAKE ONE TABLET BY MOUTH AT BEDTIME FOR CHOLESTEROL 90 tablet 1   No current facility-administered medications on file prior to visit.    Current Problems (verified) Patient Active Problem List   Diagnosis Date Noted  . Screening for rectal cancer 06/28/2015  . Prostate cancer screening 06/28/2015  . Depression screen 06/28/2015  . At low risk for fall 06/28/2015  . Morbid obesity (BMI 37.89) 08/25/2014  . Medication management 10/14/2013  . H/O prostate cancer (2006) 10/14/2013  . Essential hypertension 09/05/2013  . Diabetes mellitus without complication ([place])   . Hyperlipidemia   . Gout   . Vitamin D deficiency   . Personal history of colonic polyps 09/16/2009    Screening Tests Immunization History  Administered Date(s) Administered  . DT 03/21/2012  . Influenza-Unspecified 04/16/2014, 04/16/2015  . Pneumococcal Conjugate-13 05/19/2014  . Pneumococcal Polysaccharide-23 07/24/2007    Preventative care: Last colonoscopy: 07/21/14  Prior vaccinations: TD or Tdap: 2013  Influenza: 2016  Pneumococcal: 2009 Prevnar13: 2015 Shingles/Zostavax: Cannot afford currently  Names of Other Physician/Practitioners you currently use: 1. [facility] here for primary care 2. Dr. Felton Clinton, eye doctor, last visit 09/2014 3. Dr. Woodfin Ganja, dentist, last  visit 2015 Patient Care Team: [name], MD as PCP - General (Internal Medicine) Inda Castle, MD as Consulting Physician (Gastroenterology) Rana Snare, MD as Consulting Physician (Urology) Johnathan Hausen, MD as Consulting Physician (General Surgery)  Past Surgical History  Procedure Laterality Date  . Appendectomy  1994    and GALLBLADDER  . Cholecystectomy  1994  . Knee surgery  1996  . Colovesical fistula repair  01/2011   Family History  Problem Relation Age of Onset  . Colon cancer Neg Hx   . Esophageal cancer Neg Hx   . Rectal cancer Neg Hx   . Stomach cancer Neg Hx    Social History  Substance Use Topics  . Smoking status: Former Smoker    Quit date: 07/17/1985  . Smokeless tobacco: Never Used  . Alcohol Use: No    MEDICARE WELLNESS OBJECTIVES: Tobacco use: He does not smoke.  Patient is a former smoker. If yes, counseling given Alcohol Current alcohol use: none Osteoporosis: hypogonadism, History of fracture in the past year: no Fall risk: Minimal risk Hearing: normal Visual acuity: normal,  does  perform annual eye exam Diet: well balanced Physical activity: Current Exercise Habits:: Home exercise routine, Type of exercise: walking, Time (Minutes): 45, Frequency (Times/Week): 4, Weekly Exercise (Minutes/Week): 180, Intensity: Moderate Cardiac risk factors: Cardiac Risk Factors include: advanced age (>61men, >87 women);family history of premature cardiovascular disease;hypertension;diabetes mellitus;dyslipidemia;male gender;sedentary lifestyle;obesity (BMI >30kg/m2) Depression/mood screen:   Depression screen [facility] 2/9 06/28/2015  Decreased Interest 0  Down, Depressed, Hopeless 0  PHQ - 2 Score 0    ADLs:  In your present state of health, do you have any difficulty performing the following activities: 06/28/2015  Hearing? N  Vision? N  Difficulty concentrating or making decisions? N  Walking or climbing stairs? N  Dressing or bathing? N  Doing  errands, shopping? N  Preparing Food and eating ? N  Using the Toilet? N  In the past six months, have you accidently leaked urine? N  Do you have problems with loss of bowel control? N  Managing your Medications? N  Managing your Finances? N  Housekeeping or managing your Housekeeping? N     Cognitive Testing  Alert? Yes  Normal Appearance?Yes  Oriented to person? Yes  Place? Yes   Time? Yes  Recall of three objects?  Yes  Can perform simple calculations? Yes  Displays appropriate judgment?Yes  Can read the correct time from a watch face?Yes  EOL planning: Does patient have an advance directive?: Yes Type of Advance Directive: Healthcare Power of Attorney, Living will Copy of advanced directive(s) in chart?: Yes  Review of Systems  Constitutional: Negative for fever, chills and malaise/fatigue.  HENT: Negative for congestion, ear pain and sore throat.   Eyes: Negative.   Respiratory: Negative for cough, shortness of breath and wheezing.   Cardiovascular: Negative for chest pain, palpitations and leg swelling.  Gastrointestinal: Negative for heartburn, abdominal pain, diarrhea, constipation, blood in stool and melena.  Genitourinary: Negative.   Skin: Negative.   Neurological: Negative for dizziness, sensory change, loss of consciousness and headaches.  Psychiatric/Behavioral: Negative for depression. The patient is not nervous/anxious and does not have insomnia.      Objective:     Today's Vitals   06/28/15 1218  BP: 144/98  Pulse: 60  Temp: 97.5 F (36.4 C)  Resp: 16  Height: 6\' 2"  (1.88 m)  Weight: 279 lb 9.6 oz (126.826 kg)   Body mass index is 35.88 kg/(m^2).  General appearance: alert, no distress, WD/WN, male HEENT: normocephalic, sclerae anicteric, TMs pearly, nares patent, no discharge or erythema, pharynx normal Oral cavity: MMM, no lesions Neck: supple, no lymphadenopathy, no thyromegaly, no masses Heart: RRR, normal S1, S2, no murmurs Lungs: CTA  bilaterally, no wheezes, rhonchi, or rales Abdomen: +bs, soft, non tender, non distended, no masses, no hepatomegaly, no splenomegaly Musculoskeletal: nontender, no swelling, no obvious deformity Extremities: no edema, no cyanosis, no clubbing Pulses: 2+ symmetric, upper and lower extremities, normal cap refill Neurological: alert, oriented x 3, CN2-12 intact, strength normal upper extremities and lower extremities, sensation normal throughout, DTRs 2+ throughout, no cerebellar signs, gait Normal Psychiatric: normal affect, behavior normal, pleasant   Medicare Attestation I have personally reviewed: The patient's medical and social history Their use of alcohol, tobacco or illicit drugs Their current medications and supplements The patient's functional ability including ADLs,fall risks, home safety risks, cognitive, and hearing and visual impairment Diet and physical activities Evidence for depression or mood disorders  The patient's weight, height, BMI, and visual acuity have been recorded in the chart.  I have made referrals,  counseling, and provided education to the patient based on review of the above and I have provided the patient with a written personalized care plan for preventive services.     Starlyn Skeans, PA-C   06/28/2015

## 2015-06-28 NOTE — Progress Notes (Deleted)
Patient ID: Marcus Chambers, male   DOB: [birth year], 74 y.o.   MRN: [id number]  Medicare Annual Wellness Visit and  Comprehensive Evaluation & Examination    Assessment:     Plan:   During the course of the visit the patient was educated and counseled about appropriate screening and preventive services including:    Pneumococcal vaccine   Influenza vaccine  Td vaccine  Screening electrocardiogram  Bone densitometry screening  Colorectal cancer screening  Diabetes screening  Glaucoma screening  Nutrition counseling   Advanced directives: requested  Screening recommendations, referrals: Vaccinations: Immunization History  Administered Date(s) Administered  . DT 03/21/2012  . Influenza-Unspecified 04/16/2014  . Pneumococcal Conjugate-13 05/19/2014  . Pneumococcal Polysaccharide-23 07/24/2007    Tdap vaccine {not indicated/requested/declined:14582} Influenza vaccine {not indicated/requested/declined:14582} Pneumococcal vaccine {not indicated/requested/declined:14582} Prevnar vaccine {not indicated/requested/declined:14582} Shingles vaccine {not indicated/requested/declined:14582} Hep B vaccine {not indicated/requested/declined:14582}  Nutrition assessed and recommended  Colonoscopy {not indicated/requested/declined:14582} Recommended yearly ophthalmology/optometry visit for glaucoma screening and checkup Recommended yearly dental visit for hygiene and checkup Advanced directives - {not indicated/requested/declined:14582}  Conditions/risks identified: BMI: Discussed weight loss, diet, and increase physical activity.  Increase physical activity: AHA recommends 150 minutes of physical activity a week.  Medications reviewed Diabetes {ACTION; IS/IS GI:087931 at goal, ACE/ARB therapy: {Ace Inhibitor Therapy:20833} Urinary Incontinence {ACTION; IS/IS GI:087931 an issue: discussed non pharmacology and pharmacology options.  Fall risk: {Desc;  low/moderate/high:110033}- discussed PT, home fall assessment, medications.   Subjective:    Marcus Chambers is a 74 y.o. male who presents for Medicare Annual Wellness Visit and complete physical.  Date of last medicare wellness visit is unknown.  He has had elevated blood pressure since ***. His blood pressure {HAS HAS NOT:18834} been controlled at home, today their BP is   He {DOES_DOES JZ:4998275 workout. He denies chest pain, shortness of breath, dizziness.  He {ACTION; IS/IS GI:087931 on cholesterol medication and denies myalgias. His cholesterol {ACTION; IS/IS NOT:21021397} at goal. The cholesterol last visit was:  Lab Results  Component Value Date   CHOL 157 02/25/2015   HDL 79 02/25/2015   LDLCALC 68 02/25/2015   TRIG 52 02/25/2015   CHOLHDL 2.0 02/25/2015   He has had diabetes for *** years since ***. He {Has/has not:18111} been working on diet and exercise for ***prediabetes, and denies {Symptoms; diabetes w/o none:19199}. Last A1C in the office was:  Lab Results  Component Value Date   HGBA1C 6.3* 02/25/2015   Patient is on Vitamin D supplement.   Lab Results  Component Value Date   VD25OH 71 02/25/2015      ++++++++++++++++++++++++++++++++++++++++++++++++++++++++ This very nice 74 y.o.male presents for 3 month follow up with Hypertension, Hyperlipidemia, Pre-Diabetes and Vitamin D Deficiency.    Patient is treated for HTN & BP has been controlled at home. Today's  . Patient has had no complaints of any cardiac type chest pain, palpitations, dyspnea/orthopnea/PND, dizziness, claudication, or dependent edema.   Hyperlipidemia is controlled with diet & meds. Patient denies myalgias or other med SE's. Last Lipids were 02/25/2015: Cholesterol 157; HDL 79; LDL Cholesterol 68; Triglycerides 52        Also, the patient has history of T2_NIDDM PreDiabetes and has had no symptoms of reactive hypoglycemia, diabetic polys, paresthesias or visual blurring.  Last A1c was  02/25/2015: Hgb A1c MFr Bld 6.3*     Further, the patient also has history of Vitamin D Deficiency and supplements vitamin D without any suspected side-effects. Last vitamin D was 02/25/2015: Vit D, 25-Hydroxy 71  Lab Results  Component Value Date   VD25OH 71 02/25/2015    +++++++++++++++++++++++++++++++++++++++++++++++++++++++  Names of Other Physician/Practitioners you currently use: 1. [facility] here for primary care 2. ***, eye doctor, last visit *** 3. ***, dentist, last visit ***  Patient Care Team: [name], MD as PCP - General (Internal Medicine) Inda Castle, MD as Consulting Physician (Gastroenterology) Rana Snare, MD as Consulting Physician (Urology)  Medication Review: Current Outpatient Prescriptions on File Prior to Visit  Medication Sig Dispense Refill  . allopurinol (ZYLOPRIM) 300 MG tablet TAKE ONE TABLET BY MOUTH ONCE DAILY FOR GOUT 90 tablet 1  . aspirin (ASPIRIN LOW DOSE) 81 MG tablet Take 81 mg by mouth as needed.      . bumetanide (BUMEX) 2 MG tablet Take 1 tablet (2 mg total) by mouth 2 (two) times daily. 60 tablet 6  . Cholecalciferol (VITAMIN D PO) Take 5,000 Units by mouth. Takes 5000 iu on Mon and Thur and 10000 iu the other 5 days of week    . IRON PO Take 18 mg by mouth daily.    Marland Kitchen lisinopril (PRINIVIL,ZESTRIL) 20 MG tablet Take 1 tablet (20 mg total) by mouth daily. 90 tablet 1  . MAGNESIUM PO Take 500 mg by mouth 2 (two) times daily.     . metFORMIN (GLUCOPHAGE-XR) 500 MG 24 hr tablet TAKE TWO TABLETS BY MOUTH TWICE DAILY FOR DIABETES 360 tablet 1  . simvastatin (ZOCOR) 40 MG tablet TAKE ONE TABLET BY MOUTH AT BEDTIME FOR CHOLESTEROL 90 tablet 1   No current facility-administered medications on file prior to visit.    No Known Allergies  Current Problems (verified) Patient Active Problem List   Diagnosis Date Noted  . Screening for rectal cancer 06/28/2015  . Prostate cancer screening 06/28/2015   . Depression screen 06/28/2015  . At low risk for fall 06/28/2015  . Morbid obesity (BMI 37.89) 08/25/2014  . Medication management 10/14/2013  . H/O prostate cancer (2006) 10/14/2013  . Essential hypertension 09/05/2013  . Diabetes mellitus without complication ([place])   . Hyperlipidemia   . Gout   . Vitamin D deficiency   . Personal history of colonic polyps 09/16/2009    Screening Tests Health Maintenance  Topic Date Due  . FOOT EXAM  06/10/1951  . TETANUS/TDAP  06/09/1960  . ZOSTAVAX  06/09/2001  . INFLUENZA VACCINE  02/15/2015  . URINE MICROALBUMIN  05/20/2015  . HEMOGLOBIN A1C  08/28/2015  . OPHTHALMOLOGY EXAM  10/08/2015  . COLONOSCOPY  07/21/2021  . PNA vac Low Risk Adult  Completed    Immunization History  Administered Date(s) Administered  . DT 03/21/2012  . Influenza-Unspecified 04/16/2014  . Pneumococcal Conjugate-13 05/19/2014  . Pneumococcal Polysaccharide-23 07/24/2007    Preventative care: Last colonoscopy: ***  Past Medical History  Diagnosis Date  . Type II or unspecified type diabetes mellitus without mention of complication, not stated as uncontrolled   . Hyperlipidemia   . Hypertension   . Gout   . Vitamin D deficiency   . Adenomatous colon polyp     2010  . Prostate cancer     Past Surgical History  Procedure Laterality Date  . Appendectomy  1994    and GALLBLADDER  . Cholecystectomy  1994  . Knee surgery  1996  . Colovesical fistula repair  01/2011    Risk Factors: Tobacco Social History  Substance Use Topics  . Smoking status: Former Smoker    Quit date: 07/17/1985  .  Smokeless tobacco: Never Used  . Alcohol Use: No   He {does/does not:19097} smoke.  Patient {ACTION; IS/IS GI:087931 a former smoker. Are there smokers in your home (other than you)?  {yes/no:20286}  Alcohol Current alcohol use: {history; alcohol:11675}  Caffeine Current caffeine use: {caffeine use:31917}  Exercise Current exercise: {exercise  types:16438}  Nutrition/Diet Current diet: {diet habits:16563}  Cardiac risk factors: {risk factors:510}.  Depression Screen (Note: if answer to either of the following is "Yes", a more complete depression screening is indicated)   Q1: Over the past two weeks, have you felt down, depressed or hopeless? No  Q2: Over the past two weeks, have you felt little interest or pleasure in doing things? No  Have you lost interest or pleasure in daily life? No  Do you often feel hopeless? No  Do you cry easily over simple problems? No  Activities of Daily Living In your present state of health, do you have any difficulty performing the following activities?:  Driving? No Managing money?  No Feeding yourself? No Getting from bed to chair? No Climbing a flight of stairs? No Preparing food and eating?: No Bathing or showering? No Getting dressed: No Getting to the toilet? No Using the toilet:No Moving around from place to place: No In the past year have you fallen or had a near fall?:{yes/no (default no):140031::"No"}   Are you sexually active?  {yes/no:20286}  Do you have more than one partner?  No  Vision Difficulties: No  Hearing Difficulties: No Do you often ask people to speak up or repeat themselves? No Do you experience ringing or noises in your ears? No Do you have difficulty understanding soft or whispered voices? No  Cognition  Do you feel that you have a problem with memory?No  Do you often misplace items? No  Do you feel safe at home?  Yes  Advanced directives Does patient have a [place]? {yes/no:20286} Does patient have a Living Will? {yes/no:20286}  ROS: Constitutional: Denies fever, chills, weight loss/gain, headaches, insomnia, fatigue, night sweats or change in appetite. Eyes: Denies redness, blurred vision, diplopia, discharge, itchy or watery eyes.  ENT: Denies discharge, congestion, post nasal drip, epistaxis, sore throat, earache,  hearing loss, dental pain, Tinnitus, Vertigo, Sinus pain or snoring.  Cardio: Denies chest pain, palpitations, irregular heartbeat, syncope, dyspnea, diaphoresis, orthopnea, PND, claudication or edema Respiratory: denies cough, dyspnea, DOE, pleurisy, hoarseness, laryngitis or wheezing.  Gastrointestinal: Denies dysphagia, heartburn, reflux, water brash, pain, cramps, nausea, vomiting, bloating, diarrhea, constipation, hematemesis, melena, hematochezia, jaundice or hemorrhoids Genitourinary: Denies dysuria, frequency, urgency, nocturia, hesitancy, discharge, hematuria or flank pain Musculoskeletal: Denies arthralgia, myalgia, stiffness, Jt. Swelling, pain, limp or strain/sprain. Denies Falls. Skin: Denies puritis, rash, hives, warts, acne, eczema or change in skin lesion Neuro: No weakness, tremor, incoordination, spasms, paresthesia or pain Psychiatric: Denies confusion, memory loss or sensory loss. Denies Depression. Endocrine: Denies change in weight, skin, hair change, nocturia, and paresthesia, diabetic polys, visual blurring or hyper / hypo glycemic episodes.  Heme/Lymph: No excessive bleeding, bruising or enlarged lymph nodes.  Objective:     There were no vitals taken for this visit.  General Appearance:  Alert  WD/WN, male  in no apparent distress. Eyes: PERRLA, EOMs nl, conjunctiva normal, normal fundi and vessels. Sinuses: No frontal/maxillary tenderness ENT/Mouth: EACs patent / TMs  nl. Nares clear without erythema, swelling, mucoid exudates. Oral hygiene is good. No erythema, swelling, or exudate. Tongue normal, non-obstructing. Tonsils not swollen or erythematous. Hearing normal.  Neck:  Supple, thyroid normal. No bruits, nodes or JVD. Respiratory: Respiratory effort normal.  BS equal and clear bilateral without rales, rhonci, wheezing or stridor. Cardio: Heart sounds are normal with regular rate and rhythm and no murmurs, rubs or gallops. Peripheral pulses are normal and equal  bilaterally without edema. No aortic or femoral bruits. Chest: symmetric with normal excursions and percussion.  Abdomen: Flat, soft with nl bowel sounds. Nontender, no guarding, rebound, hernias, masses, or organomegaly.  Lymphatics: Non tender without lymphadenopathy.  Genitourinary: No hernias.Testes nl. DRE - prostate nl for age - smooth & firm w/o nodules. Musculoskeletal: Full ROM all peripheral extremities, joint stability, 5/5 strength, and normal gait. Skin: Warm and dry without rashes, lesions, cyanosis, clubbing or  ecchymosis.  Neuro: Cranial nerves intact, reflexes equal bilaterally. Normal muscle tone, no cerebellar symptoms. Sensation intact.  Pysch: Alert and oriented X 3 with normal affect, insight and judgment appropriate.   Cognitive Testing  Alert? Yes  Normal Appearance? Yes  Oriented to person? Yes  Place? Yes   Time? Yes  Recall of three objects?  Yes  Can perform simple calculations? Yes  Displays appropriate judgment? Yes  Can read the correct time from a watch/clock? Yes  Medicare Attestation I have personally reviewed: The patient's medical and social history Their use of alcohol, tobacco or illicit drugs Their current medications and supplements The patient's functional ability including ADLs,fall risks, home safety risks, cognitive, and hearing and visual impairment Diet and physical activities Evidence for depression or mood disorders  The patient's weight, height, BMI, and visual acuity have been recorded in the chart.  I have made referrals, counseling, and provided education to the patient based on review of the above and I have provided the patient with a written personalized care plan for preventive services.  Over 40 minutes of exam, counseling, chart review was performed.   Tonna Palazzi DAVID, MD   06/28/2015

## 2015-06-29 LAB — URINALYSIS, ROUTINE W REFLEX MICROSCOPIC
Bilirubin Urine: NEGATIVE
Glucose, UA: NEGATIVE
HGB URINE DIPSTICK: NEGATIVE
Ketones, ur: NEGATIVE
LEUKOCYTES UA: NEGATIVE
NITRITE: NEGATIVE
PH: 6.5 (ref 5.0–8.0)
Protein, ur: NEGATIVE
Specific Gravity, Urine: 1.023 (ref 1.001–1.035)

## 2015-06-29 LAB — CBC WITH DIFFERENTIAL/PLATELET
BASOS ABS: 0.1 10*3/uL (ref 0.0–0.1)
BASOS PCT: 1 % (ref 0–1)
EOS PCT: 2 % (ref 0–5)
Eosinophils Absolute: 0.1 10*3/uL (ref 0.0–0.7)
HCT: 40.8 % (ref 39.0–52.0)
HEMOGLOBIN: 13 g/dL (ref 13.0–17.0)
Lymphocytes Relative: 23 % (ref 12–46)
Lymphs Abs: 1.2 10*3/uL (ref 0.7–4.0)
MCH: 26.4 pg (ref 26.0–34.0)
MCHC: 31.9 g/dL (ref 30.0–36.0)
MCV: 82.8 fL (ref 78.0–100.0)
MONO ABS: 0.4 10*3/uL (ref 0.1–1.0)
MPV: 10.7 fL (ref 8.6–12.4)
Monocytes Relative: 8 % (ref 3–12)
NEUTROS ABS: 3.4 10*3/uL (ref 1.7–7.7)
Neutrophils Relative %: 66 % (ref 43–77)
Platelets: 219 10*3/uL (ref 150–400)
RBC: 4.93 MIL/uL (ref 4.22–5.81)
RDW: 14.8 % (ref 11.5–15.5)
WBC: 5.1 10*3/uL (ref 4.0–10.5)

## 2015-06-29 LAB — INSULIN, RANDOM: Insulin: 8.5 u[IU]/mL (ref 2.0–19.6)

## 2015-06-29 LAB — MICROALBUMIN / CREATININE URINE RATIO
Creatinine, Urine: 210 mg/dL (ref 20–370)
Microalb Creat Ratio: 12 mcg/mg creat (ref <30)
Microalb, Ur: 2.6 mg/dL

## 2015-06-29 LAB — BASIC METABOLIC PANEL WITH GFR
BUN: 17 mg/dL (ref 7–25)
CALCIUM: 10.1 mg/dL (ref 8.6–10.3)
CO2: 28 mmol/L (ref 20–31)
Chloride: 102 mmol/L (ref 98–110)
Creat: 1.06 mg/dL (ref 0.70–1.18)
GFR, EST AFRICAN AMERICAN: 80 mL/min (ref >=60)
GFR, EST NON AFRICAN AMERICAN: 69 mL/min (ref >=60)
Glucose, Bld: 87 mg/dL (ref 65–99)
Potassium: 3.9 mmol/L (ref 3.5–5.3)
SODIUM: 140 mmol/L (ref 135–146)

## 2015-06-29 LAB — HEPATIC FUNCTION PANEL
ALT: 11 U/L (ref 9–46)
AST: 15 U/L (ref 10–35)
Albumin: 3.9 g/dL (ref 3.6–5.1)
Alkaline Phosphatase: 50 U/L (ref 40–115)
BILIRUBIN DIRECT: 0.1 mg/dL (ref <=0.2)
BILIRUBIN INDIRECT: 0.6 mg/dL (ref 0.2–1.2)
Total Bilirubin: 0.7 mg/dL (ref 0.2–1.2)
Total Protein: 7.4 g/dL (ref 6.1–8.1)

## 2015-06-29 LAB — LIPID PANEL
CHOL/HDL RATIO: 1.9 ratio (ref <=5.0)
CHOLESTEROL: 150 mg/dL (ref 125–200)
HDL: 77 mg/dL (ref >=40)
LDL Cholesterol: 60 mg/dL (ref <130)
TRIGLYCERIDES: 66 mg/dL (ref <150)
VLDL: 13 mg/dL (ref <30)

## 2015-06-29 LAB — TSH: TSH: 0.725 u[IU]/mL (ref 0.350–4.500)

## 2015-06-29 LAB — MAGNESIUM: Magnesium: 1.6 mg/dL (ref 1.5–2.5)

## 2015-06-29 LAB — URIC ACID: URIC ACID, SERUM: 6.3 mg/dL (ref 4.0–7.8)

## 2015-06-29 LAB — VITAMIN D 25 HYDROXY (VIT D DEFICIENCY, FRACTURES): Vit D, 25-Hydroxy: 89 ng/mL (ref 30–100)

## 2015-06-29 LAB — HEMOGLOBIN A1C
Hgb A1c MFr Bld: 6 % — ABNORMAL HIGH (ref <5.7)
Mean Plasma Glucose: 126 mg/dL — ABNORMAL HIGH (ref <117)

## 2015-06-29 LAB — PSA: PSA: 0.24 ng/mL (ref <=4.00)

## 2015-07-13 ENCOUNTER — Other Ambulatory Visit: Payer: Self-pay | Admitting: *Deleted

## 2015-07-13 DIAGNOSIS — Z1212 Encounter for screening for malignant neoplasm of rectum: Secondary | ICD-10-CM

## 2015-07-13 LAB — POC HEMOCCULT BLD/STL (HOME/3-CARD/SCREEN)
Card #2 Fecal Occult Blod, POC: NEGATIVE
FECAL OCCULT BLD: NEGATIVE
Fecal Occult Blood, POC: NEGATIVE

## 2015-08-09 ENCOUNTER — Other Ambulatory Visit: Payer: Self-pay | Admitting: Internal Medicine

## 2015-08-13 ENCOUNTER — Other Ambulatory Visit: Payer: Self-pay | Admitting: Internal Medicine

## 2015-08-13 DIAGNOSIS — F411 Generalized anxiety disorder: Secondary | ICD-10-CM

## 2015-08-13 MED ORDER — ALPRAZOLAM 1 MG PO TABS: ORAL_TABLET | ORAL | Status: DC

## 2015-08-30 ENCOUNTER — Other Ambulatory Visit: Payer: Self-pay | Admitting: Internal Medicine

## 2015-10-01 ENCOUNTER — Ambulatory Visit (INDEPENDENT_AMBULATORY_CARE_PROVIDER_SITE_OTHER): Payer: Medicare Other | Admitting: Internal Medicine

## 2015-10-01 ENCOUNTER — Encounter: Payer: Self-pay | Admitting: Internal Medicine

## 2015-10-01 VITALS — BP 140/78 | HR 58 | Temp 98.0°F | Resp 18 | Ht 74.0 in | Wt 288.0 lb

## 2015-10-01 DIAGNOSIS — E785 Hyperlipidemia, unspecified: Secondary | ICD-10-CM

## 2015-10-01 DIAGNOSIS — E119 Type 2 diabetes mellitus without complications: Secondary | ICD-10-CM | POA: Diagnosis not present

## 2015-10-01 DIAGNOSIS — J309 Allergic rhinitis, unspecified: Secondary | ICD-10-CM | POA: Diagnosis not present

## 2015-10-01 DIAGNOSIS — E559 Vitamin D deficiency, unspecified: Secondary | ICD-10-CM

## 2015-10-01 DIAGNOSIS — I10 Essential (primary) hypertension: Secondary | ICD-10-CM | POA: Diagnosis not present

## 2015-10-01 DIAGNOSIS — Z79899 Other long term (current) drug therapy: Secondary | ICD-10-CM

## 2015-10-01 LAB — CBC WITH DIFFERENTIAL/PLATELET
BASOS ABS: 0 10*3/uL (ref 0.0–0.1)
Basophils Relative: 0 % (ref 0–1)
Eosinophils Absolute: 0.1 10*3/uL (ref 0.0–0.7)
Eosinophils Relative: 2 % (ref 0–5)
HEMATOCRIT: 39.4 % (ref 39.0–52.0)
HEMOGLOBIN: 12.6 g/dL — AB (ref 13.0–17.0)
LYMPHS PCT: 19 % (ref 12–46)
Lymphs Abs: 1.3 10*3/uL (ref 0.7–4.0)
MCH: 25.9 pg — ABNORMAL LOW (ref 26.0–34.0)
MCHC: 32 g/dL (ref 30.0–36.0)
MCV: 81.1 fL (ref 78.0–100.0)
MONO ABS: 0.5 10*3/uL (ref 0.1–1.0)
MPV: 10.8 fL (ref 8.6–12.4)
Monocytes Relative: 7 % (ref 3–12)
NEUTROS ABS: 4.9 10*3/uL (ref 1.7–7.7)
NEUTROS PCT: 72 % (ref 43–77)
Platelets: 220 10*3/uL (ref 150–400)
RBC: 4.86 MIL/uL (ref 4.22–5.81)
RDW: 15.7 % — ABNORMAL HIGH (ref 11.5–15.5)
WBC: 6.8 10*3/uL (ref 4.0–10.5)

## 2015-10-01 LAB — HEPATIC FUNCTION PANEL
ALK PHOS: 64 U/L (ref 40–115)
ALT: 15 U/L (ref 9–46)
AST: 16 U/L (ref 10–35)
Albumin: 3.9 g/dL (ref 3.6–5.1)
BILIRUBIN DIRECT: 0.2 mg/dL (ref <=0.2)
BILIRUBIN INDIRECT: 0.5 mg/dL (ref 0.2–1.2)
TOTAL PROTEIN: 7.1 g/dL (ref 6.1–8.1)
Total Bilirubin: 0.7 mg/dL (ref 0.2–1.2)

## 2015-10-01 LAB — LIPID PANEL
Cholesterol: 154 mg/dL (ref 125–200)
HDL: 76 mg/dL (ref >=40)
LDL CALC: 67 mg/dL (ref <130)
Total CHOL/HDL Ratio: 2 Ratio (ref <=5.0)
Triglycerides: 54 mg/dL (ref <150)
VLDL: 11 mg/dL (ref <30)

## 2015-10-01 LAB — BASIC METABOLIC PANEL WITH GFR
BUN: 14 mg/dL (ref 7–25)
CHLORIDE: 104 mmol/L (ref 98–110)
CO2: 26 mmol/L (ref 20–31)
Calcium: 9.7 mg/dL (ref 8.6–10.3)
Creat: 0.96 mg/dL (ref 0.70–1.18)
GFR, EST NON AFRICAN AMERICAN: 78 mL/min (ref >=60)
GFR, Est African American: >89 mL/min (ref >=60)
GLUCOSE: 115 mg/dL — AB (ref 65–99)
POTASSIUM: 3.9 mmol/L (ref 3.5–5.3)
Sodium: 140 mmol/L (ref 135–146)

## 2015-10-01 LAB — HEMOGLOBIN A1C
HEMOGLOBIN A1C: 6 % — AB (ref <5.7)
Mean Plasma Glucose: 126 mg/dL — ABNORMAL HIGH (ref <117)

## 2015-10-01 LAB — TSH: TSH: 0.55 m[IU]/L (ref 0.40–4.50)

## 2015-10-01 MED ORDER — FLUTICASONE PROPIONATE 50 MCG/ACT NA SUSP: 2.0000 | Freq: Every day | NASAL | Status: DC

## 2015-10-01 MED ORDER — BENZONATATE 100 MG PO CAPS: 100.0000 mg | ORAL_CAPSULE | Freq: Four times a day (QID) | ORAL | Status: DC | PRN

## 2015-10-01 MED ORDER — CHLORPHEN-PE-ACETAMINOPHEN 4-10-325 MG PO TABS: 1.0000 | ORAL_TABLET | Freq: Two times a day (BID) | ORAL | Status: DC

## 2015-10-01 NOTE — Patient Instructions (Signed)
Use 2 sprays per nostril of flonase nightly before bedtime.  Please take the tessalon up to 3 times daily as needed for coughing.  Please take norel AD daily as needed for congestion.  You can stop these medications if you improve.

## 2015-10-01 NOTE — Progress Notes (Signed)
Assessment and Plan:  Hypertension:  -Continue medication,  -monitor blood pressure at home.  -Continue DASH diet.   -Reminder to go to the ER if any CP, SOB, nausea, dizziness, severe HA, changes vision/speech, left arm numbness and tingling, and jaw pain.  Cholesterol: -Continue diet and exercise.  -Check cholesterol.   Pre-diabetes: -Continue diet and exercise.  -Check A1C  Vitamin D Def: -check level -continue medications.   Allergic rhinitis -flonase -norel ad -tessalon prn  Continue diet and meds as discussed. Further disposition pending results of labs.  HPI 75 y.o. male  presents for 3 month follow up with hypertension, hyperlipidemia, prediabetes and vitamin D.   His blood pressure has been controlled at home, today their BP is BP: 140/78 mmHg.   He does workout. He denies chest pain, shortness of breath, dizziness.   He is on cholesterol medication and denies myalgias. His cholesterol is at goal. The cholesterol last visit was:   Lab Results  Component Value Date   CHOL 150 06/28/2015   HDL 77 06/28/2015   LDLCALC 60 06/28/2015   TRIG 66 06/28/2015   CHOLHDL 1.9 06/28/2015     He has been working on diet and exercise for prediabetes, and denies foot ulcerations, hyperglycemia, hypoglycemia , increased appetite, nausea, paresthesia of the feet, polydipsia, polyuria, visual disturbances, vomiting and weight loss. Last A1C in the office was:  Lab Results  Component Value Date   HGBA1C 6.0* 06/28/2015    Patient is on Vitamin D supplement.  Lab Results  Component Value Date   VD25OH 53 06/28/2015     He reports that he has had some congestion and rhinorrhea which has been going on for 3 days.  He has had some mild cough.  He reports clear nasal sputum.  He reports that he has been eating a little more cake.  He reports no swelling to the legs or ankles.  No worsening PND or orthopnea.     Current Medications:  Current Outpatient Prescriptions on File  Prior to Visit  Medication Sig Dispense Refill  . allopurinol (ZYLOPRIM) 300 MG tablet TAKE ONE TABLET BY MOUTH ONCE DAILY FOR GOUT 90 tablet 1  . ALPRAZolam (XANAX) 1 MG tablet Take 1/2 to 1 tablet 3 x day if needed for anxiety or sleep 90 tablet 5  . aspirin (ASPIRIN LOW DOSE) 81 MG tablet Take 81 mg by mouth as needed.      . bisoprolol-hydrochlorothiazide (ZIAC) 5-6.25 MG tablet Take 1 tablet by mouth daily. 90 tablet 0  . bumetanide (BUMEX) 2 MG tablet Take 1 tablet (2 mg total) by mouth 2 (two) times daily. 60 tablet 6  . Cholecalciferol (VITAMIN D PO) Take 5,000 Units by mouth. Takes 5000 iu on Mon and Thur and 10000 iu the other 5 days of week    . IRON PO Take 18 mg by mouth daily.    Marland Kitchen lisinopril (PRINIVIL,ZESTRIL) 20 MG tablet Take 1 tablet (20 mg total) by mouth daily. 90 tablet 1  . MAGNESIUM PO Take 500 mg by mouth 2 (two) times daily.     . metFORMIN (GLUCOPHAGE-XR) 500 MG 24 hr tablet TAKE TWO TABLETS BY MOUTH TWICE DAILY FOR DIABETES 360 tablet 1  . simvastatin (ZOCOR) 40 MG tablet TAKE ONE TABLET BY MOUTH AT BEDTIME FOR CHOLESTEROL 90 tablet 0   No current facility-administered medications on file prior to visit.    Medical History:  Past Medical History  Diagnosis Date  . Type II or unspecified  type diabetes mellitus without mention of complication, not stated as uncontrolled   . Hyperlipidemia   . Hypertension   . Gout   . Vitamin D deficiency   . Adenomatous colon polyp     2010  . Prostate cancer ([place])     Allergies: No Known Allergies   Review of Systems:  ROS  Family history- Review and unchanged  Social history- Review and unchanged  Physical Exam: BP 140/78 mmHg  Pulse 58  Temp(Src) 98 F (36.7 C) (Temporal)  Resp 18  Ht 6\' 2"  (1.88 m)  Wt 288 lb (130.636 kg)  BMI 36.96 kg/m2 Wt Readings from Last 3 Encounters:  10/01/15 288 lb (130.636 kg)  06/28/15 279 lb 9.6 oz (126.826 kg)  02/25/15 290 lb (131.543 kg)    General Appearance: Well  nourished well developed, in no apparent distress. Eyes: PERRLA, EOMs, conjunctiva no swelling or erythema ENT/Mouth: Ear canals normal without obstruction, swelling, erythma, discharge.  TMs normal bilaterally.  Oropharynx moist, clear, without exudate, or postoropharyngeal swelling. Neck: Supple, thyroid normal,no cervical adenopathy  Respiratory: Respiratory effort normal, Breath sounds clear A&P without rhonchi, wheeze, or rale.  No retractions, no accessory usage. Cardio: RRR with no MRGs. Brisk peripheral pulses without edema.  Abdomen: Soft, + BS,  Non tender, no guarding, rebound, hernias, masses. Musculoskeletal: Full ROM, 5/5 strength, Normal gait Skin: Warm, dry without rashes, lesions, ecchymosis.  Neuro: Awake and oriented X 3, Cranial nerves intact. Normal muscle tone, no cerebellar symptoms. Psych: Normal affect, Insight and Judgment appropriate.    Starlyn Skeans, PA-C 11:08 AM [facility]

## 2015-11-02 DIAGNOSIS — H2513 Age-related nuclear cataract, bilateral: Secondary | ICD-10-CM | POA: Diagnosis not present

## 2015-11-02 DIAGNOSIS — Z7984 Long term (current) use of oral hypoglycemic drugs: Secondary | ICD-10-CM | POA: Diagnosis not present

## 2015-11-02 DIAGNOSIS — E119 Type 2 diabetes mellitus without complications: Secondary | ICD-10-CM | POA: Diagnosis not present

## 2015-11-22 ENCOUNTER — Other Ambulatory Visit: Payer: Self-pay | Admitting: Internal Medicine

## 2015-11-22 MED ORDER — LISINOPRIL 20 MG PO TABS: 20.0000 mg | ORAL_TABLET | Freq: Every day | ORAL | Status: DC

## 2015-12-04 ENCOUNTER — Encounter: Payer: Self-pay | Admitting: *Deleted

## 2015-12-06 ENCOUNTER — Other Ambulatory Visit: Payer: Self-pay | Admitting: Internal Medicine

## 2016-01-04 ENCOUNTER — Encounter (INDEPENDENT_AMBULATORY_CARE_PROVIDER_SITE_OTHER): Payer: Self-pay

## 2016-01-04 ENCOUNTER — Ambulatory Visit (INDEPENDENT_AMBULATORY_CARE_PROVIDER_SITE_OTHER): Payer: Medicare Other | Admitting: Internal Medicine

## 2016-01-04 ENCOUNTER — Encounter: Payer: Self-pay | Admitting: Internal Medicine

## 2016-01-04 VITALS — BP 140/80 | HR 68 | Temp 97.8°F | Resp 16 | Ht 73.0 in | Wt 288.0 lb

## 2016-01-04 DIAGNOSIS — I10 Essential (primary) hypertension: Secondary | ICD-10-CM

## 2016-01-04 DIAGNOSIS — E119 Type 2 diabetes mellitus without complications: Secondary | ICD-10-CM | POA: Diagnosis not present

## 2016-01-04 DIAGNOSIS — M1000 Idiopathic gout, unspecified site: Secondary | ICD-10-CM | POA: Diagnosis not present

## 2016-01-04 DIAGNOSIS — E785 Hyperlipidemia, unspecified: Secondary | ICD-10-CM

## 2016-01-04 DIAGNOSIS — E559 Vitamin D deficiency, unspecified: Secondary | ICD-10-CM | POA: Diagnosis not present

## 2016-01-04 DIAGNOSIS — M109 Gout, unspecified: Secondary | ICD-10-CM | POA: Diagnosis not present

## 2016-01-04 DIAGNOSIS — Z79899 Other long term (current) drug therapy: Secondary | ICD-10-CM | POA: Diagnosis not present

## 2016-01-04 LAB — BASIC METABOLIC PANEL WITH GFR
BUN: 15 mg/dL (ref 7–25)
CO2: 23 mmol/L (ref 20–31)
Calcium: 9.6 mg/dL (ref 8.6–10.3)
Chloride: 104 mmol/L (ref 98–110)
Creat: 1.04 mg/dL (ref 0.70–1.18)
GFR, EST AFRICAN AMERICAN: 81 mL/min (ref >=60)
GFR, Est Non African American: 70 mL/min (ref >=60)
GLUCOSE: 95 mg/dL (ref 65–99)
POTASSIUM: 4.1 mmol/L (ref 3.5–5.3)
Sodium: 137 mmol/L (ref 135–146)

## 2016-01-04 LAB — CBC WITH DIFFERENTIAL/PLATELET
BASOS ABS: 0 {cells}/uL (ref 0–200)
BASOS PCT: 0 %
EOS ABS: 92 {cells}/uL (ref 15–500)
Eosinophils Relative: 2 %
HEMATOCRIT: 39.2 % (ref 38.5–50.0)
Hemoglobin: 12.7 g/dL — ABNORMAL LOW (ref 13.2–17.1)
LYMPHS PCT: 27 %
Lymphs Abs: 1242 cells/uL (ref 850–3900)
MCH: 26.3 pg — AB (ref 27.0–33.0)
MCHC: 32.4 g/dL (ref 32.0–36.0)
MCV: 81.3 fL (ref 80.0–100.0)
MONO ABS: 460 {cells}/uL (ref 200–950)
MPV: 10.1 fL (ref 7.5–12.5)
Monocytes Relative: 10 %
NEUTROS PCT: 61 %
Neutro Abs: 2806 cells/uL (ref 1500–7800)
Platelets: 206 10*3/uL (ref 140–400)
RBC: 4.82 MIL/uL (ref 4.20–5.80)
RDW: 15.8 % — AB (ref 11.0–15.0)
WBC: 4.6 10*3/uL (ref 3.8–10.8)

## 2016-01-04 LAB — LIPID PANEL
CHOL/HDL RATIO: 1.8 ratio (ref <=5.0)
Cholesterol: 152 mg/dL (ref 125–200)
HDL: 83 mg/dL (ref >=40)
LDL CALC: 60 mg/dL (ref <130)
Triglycerides: 43 mg/dL (ref <150)
VLDL: 9 mg/dL (ref <30)

## 2016-01-04 LAB — HEPATIC FUNCTION PANEL
ALT: 11 U/L (ref 9–46)
AST: 15 U/L (ref 10–35)
Albumin: 4.1 g/dL (ref 3.6–5.1)
Alkaline Phosphatase: 45 U/L (ref 40–115)
BILIRUBIN DIRECT: 0.2 mg/dL (ref <=0.2)
Indirect Bilirubin: 0.4 mg/dL (ref 0.2–1.2)
TOTAL PROTEIN: 7 g/dL (ref 6.1–8.1)
Total Bilirubin: 0.6 mg/dL (ref 0.2–1.2)

## 2016-01-04 LAB — URIC ACID: Uric Acid, Serum: 6 mg/dL (ref 4.0–8.0)

## 2016-01-04 LAB — HEMOGLOBIN A1C
Hgb A1c MFr Bld: 5.9 % — ABNORMAL HIGH (ref <5.7)
Mean Plasma Glucose: 123 mg/dL

## 2016-01-04 LAB — MAGNESIUM: MAGNESIUM: 1.5 mg/dL (ref 1.5–2.5)

## 2016-01-04 LAB — TSH: TSH: 0.79 m[IU]/L (ref 0.40–4.50)

## 2016-01-04 NOTE — Progress Notes (Signed)
Patient ID: Marcus Chambers, male   DOB: [birth year], 75 y.o.   MRN: [id number]  [facility]                       [name], M.D.        Uvaldo Bristle. Silverio Lay, P.A.-C       Starlyn Skeans, P.A.-C   [facility]                [address] [place], [place] [id number] Telephone [phone] Telefax [phone] _________________________________________________________________________     This very nice 75 y.o. DBM presents for 6 month follow up with Hypertension, Hyperlipidemia, T2_NIDDM  and Vitamin D Deficiency. In 2008 , he was dx'd with gleason 6 Prostate Ca and completed 8 weeks external radiation treatments and has since been rfeleased by Dr Risa Grill. .      Patient is treated for HTN since 1989 & BP has been controlled at home. Today's BP: 140/80 mmHg. In 2008 he had a False (+) Myoview with a Negative Heart Cath. Patient has had no complaints of any cardiac type chest pain, palpitations, dyspnea/orthopnea/PND, dizziness, claudication, or dependent edema.     Hyperlipidemia is controlled with diet & meds. Patient denies myalgias or other med SE's. Last Lipids were  At goal with Cholesterol 154; HDL 76; LDL 67; Triglycerides 54 on 10/01/2015.     Also, the patient has history of Morbid Obesity (BMI 38+) and consequent T2_NIDDM since circa 2005 and has had no symptoms of reactive hypoglycemia, diabetic polys, paresthesias or visual blurring.  He reports CBG's range between 80-100 mg%. Last A1c was 6.0% on  10/01/2015.     Further, the patient also has history of Vitamin D Deficiency of "29" in 2008 and supplements vitamin D without any suspected side-effects. Last vitamin D was  89 on  06/28/2015.  Medication Sig  . allopurinol 300 MG TAKE ONE TABLET BY MOUTH ONCE DAILY FOR GOUT  . ALPRAZolam 1 MG Take 1/2 to 1 tablet 3 x day if needed for anxiety or sleep  . aspirin  81 MG tablet Take 81 mg by mouth as  needed.    . bisoprolol-hctz [facility]) 5-6.25  Take 1 tablet by mouth daily.  . bumetanide  2 MG tablet Take 1 tablet (2 mg total) by mouth 2 (two) times daily.  . Chlorphen-PE-Acetaminophen 4-10-325 MG TABS Take 1 tablet by mouth 2 (two) times daily.  Marland Kitchen VITAMIN D Take 5,000 Units by mouth. Takes 5000 iu on Mon and Thur and 10000 iu the other 5 days of week  . FLONASE nasal spray Place 2 sprays into both nostrils daily.  . IRON PO Take 18 mg by mouth daily.  Marland Kitchen lisinopril  20 MG Take 1 tablet (20 mg total) by mouth daily.  Marland Kitchen MAGNESIUM  Take 500 mg by mouth 2 (two) times daily.   . metFORMIN-XR 500 MG TAKE TWO TABLETS BY MOUTH TWICE DAILY FOR DIABETES  . simvastatin40 MG tablet TAKE ONE TABLET BY MOUTH AT BEDTIME FOR CHOLESTEROL   No Known Allergies  PMHx:   Past Medical History  Diagnosis Date  . Type II or unspecified type diabetes mellitus without mention of complication, not stated as uncontrolled   . Hyperlipidemia   . Hypertension   . Gout   . Vitamin D deficiency   . Adenomatous colon  polyp     2010  . Prostate cancer ([place])    Immunization History  Administered Date(s) Administered  . DT 03/21/2012  . Influenza-Unspecified 04/16/2014, 04/16/2015  . Pneumococcal Conjugate-13 05/19/2014  . Pneumococcal Polysaccharide-23 07/24/2007   Past Surgical History  Procedure Laterality Date  . Appendectomy  1994    and GALLBLADDER  . Cholecystectomy  1994  . Knee surgery  1996  . Colovesical fistula repair  01/2011   FHx:    Reviewed / unchanged  SHx:    Reviewed / unchanged  Systems Review:  Constitutional: Denies fever, chills, wt changes, headaches, insomnia, fatigue, night sweats, change in appetite. Eyes: Denies redness, blurred vision, diplopia, discharge, itchy, watery eyes.  ENT: Denies discharge, congestion, post nasal drip, epistaxis, sore throat, earache, hearing loss, dental pain, tinnitus, vertigo, sinus pain, snoring.  CV: Denies chest pain, palpitations, irregular  heartbeat, syncope, dyspnea, diaphoresis, orthopnea, PND, claudication or edema. Respiratory: denies cough, dyspnea, DOE, pleurisy, hoarseness, laryngitis, wheezing.  Gastrointestinal: Denies dysphagia, odynophagia, heartburn, reflux, water brash, abdominal pain or cramps, nausea, vomiting, bloating, diarrhea, constipation, hematemesis, melena, hematochezia  or hemorrhoids. Genitourinary: Denies dysuria, frequency, urgency, nocturia, hesitancy, discharge, hematuria or flank pain. Musculoskeletal: Denies arthralgias, myalgias, stiffness, jt. swelling, pain, limping or strain/sprain.  Skin: Denies pruritus, rash, hives, warts, acne, eczema or change in skin lesion(s). Neuro: No weakness, tremor, incoordination, spasms, paresthesia or pain. Psychiatric: Denies confusion, memory loss or sensory loss. Endo: Denies change in weight, skin or hair change.  Heme/Lymph: No excessive bleeding, bruising or enlarged lymph nodes.  Physical Exam  BP 140/80 mmHg  Pulse 68  Temp(Src) 97.8 F (36.6 C)  Resp 16  Ht 6\' 1"  (1.854 m)  Wt 288 lb (130.636 kg)  BMI 38.01 kg/m2  Appearsovernourished and in no distress. Eyes: PERRLA, EOMs, conjunctiva no swelling or erythema. Sinuses: No frontal/maxillary tenderness ENT/Mouth: EAC's clear, TM's nl w/o erythema, bulging. Nares clear w/o erythema, swelling, exudates. Oropharynx clear without erythema or exudates. Oral hygiene is good. Tongue normal, non obstructing. Hearing intact.  Neck: Supple. Thyroid nl. Car 2+/2+ without bruits, nodes or JVD. Chest: Respirations nl with BS clear & equal w/o rales, rhonchi, wheezing or stridor.  Cor: Heart sounds normal w/ regular rate and rhythm without sig. murmurs, gallops, clicks, or rubs. Peripheral pulses normal and equal  without edema.  Abdomen: Soft & bowel sounds normal. Non-tender w/o guarding, rebound, hernias, masses, or organomegaly.  Lymphatics: Unremarkable.  Musculoskeletal: Full ROM all peripheral  extremities, joint stability, 5/5 strength, and normal gait.  Skin: Warm, dry without exposed rashes, lesions or ecchymosis apparent.  Neuro: Cranial nerves intact, reflexes equal bilaterally. Sensory-motor testing grossly intact. Tendon reflexes grossly intact.  Pysch: Alert & oriented x 3.  Insight and judgement nl & appropriate. No ideations.  Assessment and Plan:  1. Essential hypertension  - TSH  2. Hyperlipidemia  - TSH - Lipid panel  3. Diabetes mellitus without complication (HCC)  - Hemoglobin A1c - Insulin, random  4. Vitamin D deficiency  - VITAMIN D 25 Hydroxy   5. Idiopathic gout  - Uric acid  6. Morbid obesity due to excess calories ([place])   7. Medication management  - CBC with Differential/Platelet - BASIC METABOLIC PANEL WITH GFR - Hepatic function panel - Magnesium   Recommended regular exercise, BP monitoring, weight control, and discussed med and SE's. Recommended labs to assess and monitor clinical status. Further disposition pending results of labs. Over 30 minutes of exam, counseling, chart review was performed

## 2016-01-04 NOTE — Patient Instructions (Signed)

## 2016-01-05 LAB — VITAMIN D 25 HYDROXY (VIT D DEFICIENCY, FRACTURES): Vit D, 25-Hydroxy: 78 ng/mL (ref 30–100)

## 2016-01-05 LAB — INSULIN, RANDOM: INSULIN: 6 u[IU]/mL (ref 2.0–19.6)

## 2016-01-11 ENCOUNTER — Other Ambulatory Visit: Payer: Self-pay | Admitting: Internal Medicine

## 2016-03-06 ENCOUNTER — Other Ambulatory Visit: Payer: Self-pay | Admitting: Internal Medicine

## 2016-04-12 ENCOUNTER — Ambulatory Visit (INDEPENDENT_AMBULATORY_CARE_PROVIDER_SITE_OTHER): Payer: Medicare Other | Admitting: Physician Assistant

## 2016-04-12 ENCOUNTER — Encounter: Payer: Self-pay | Admitting: Physician Assistant

## 2016-04-12 VITALS — BP 130/88 | HR 70 | Temp 97.5°F | Resp 16 | Ht 73.0 in | Wt 291.8 lb

## 2016-04-12 DIAGNOSIS — M1000 Idiopathic gout, unspecified site: Secondary | ICD-10-CM

## 2016-04-12 DIAGNOSIS — E785 Hyperlipidemia, unspecified: Secondary | ICD-10-CM | POA: Diagnosis not present

## 2016-04-12 DIAGNOSIS — I10 Essential (primary) hypertension: Secondary | ICD-10-CM | POA: Diagnosis not present

## 2016-04-12 DIAGNOSIS — Z79899 Other long term (current) drug therapy: Secondary | ICD-10-CM

## 2016-04-12 DIAGNOSIS — E119 Type 2 diabetes mellitus without complications: Secondary | ICD-10-CM

## 2016-04-12 LAB — BASIC METABOLIC PANEL WITH GFR
BUN: 12 mg/dL (ref 7–25)
CALCIUM: 9.7 mg/dL (ref 8.6–10.3)
CHLORIDE: 105 mmol/L (ref 98–110)
CO2: 24 mmol/L (ref 20–31)
Creat: 0.96 mg/dL (ref 0.70–1.18)
GFR, EST NON AFRICAN AMERICAN: 78 mL/min (ref >=60)
GFR, Est African American: >89 mL/min (ref >=60)
Glucose, Bld: 90 mg/dL (ref 65–99)
Potassium: 4.4 mmol/L (ref 3.5–5.3)
SODIUM: 137 mmol/L (ref 135–146)

## 2016-04-12 LAB — CBC WITH DIFFERENTIAL/PLATELET
BASOS PCT: 1 %
Basophils Absolute: 52 cells/uL (ref 0–200)
EOS ABS: 104 {cells}/uL (ref 15–500)
Eosinophils Relative: 2 %
HEMATOCRIT: 38.5 % (ref 38.5–50.0)
Hemoglobin: 12.6 g/dL — ABNORMAL LOW (ref 13.2–17.1)
LYMPHS ABS: 1092 {cells}/uL (ref 850–3900)
Lymphocytes Relative: 21 %
MCH: 26.6 pg — ABNORMAL LOW (ref 27.0–33.0)
MCHC: 32.7 g/dL (ref 32.0–36.0)
MCV: 81.4 fL (ref 80.0–100.0)
MONO ABS: 416 {cells}/uL (ref 200–950)
MPV: 10.4 fL (ref 7.5–12.5)
Monocytes Relative: 8 %
NEUTROS ABS: 3536 {cells}/uL (ref 1500–7800)
Neutrophils Relative %: 68 %
Platelets: 235 10*3/uL (ref 140–400)
RBC: 4.73 MIL/uL (ref 4.20–5.80)
RDW: 15.2 % — ABNORMAL HIGH (ref 11.0–15.0)
WBC: 5.2 10*3/uL (ref 3.8–10.8)

## 2016-04-12 LAB — HEPATIC FUNCTION PANEL
ALT: 12 U/L (ref 9–46)
AST: 15 U/L (ref 10–35)
Albumin: 3.9 g/dL (ref 3.6–5.1)
Alkaline Phosphatase: 45 U/L (ref 40–115)
BILIRUBIN DIRECT: 0.1 mg/dL (ref <=0.2)
BILIRUBIN INDIRECT: 0.4 mg/dL (ref 0.2–1.2)
BILIRUBIN TOTAL: 0.5 mg/dL (ref 0.2–1.2)
Total Protein: 6.8 g/dL (ref 6.1–8.1)

## 2016-04-12 LAB — LIPID PANEL
CHOL/HDL RATIO: 1.9 ratio (ref <=5.0)
CHOLESTEROL: 143 mg/dL (ref 125–200)
HDL: 76 mg/dL (ref >=40)
LDL Cholesterol: 58 mg/dL (ref <130)
Triglycerides: 45 mg/dL (ref <150)
VLDL: 9 mg/dL (ref <30)

## 2016-04-12 LAB — MAGNESIUM: Magnesium: 1.5 mg/dL (ref 1.5–2.5)

## 2016-04-12 LAB — TSH: TSH: 0.78 m[IU]/L (ref 0.40–4.50)

## 2016-04-12 NOTE — Patient Instructions (Addendum)
Only take 1/2 of the allopurinol daily  Information for patients with Gout  Gout defined-Gout occurs when urate crystals accumulate in your joint causing the inflammation and intense pain of gout attack.  Urate crystals can form when you have high levels of uric acid in your blood.  Your body produces uric acid when it breaks down prurines-substances that are found naturally in your body, as well as in certain foods such as organ meats, anchioves, herring, asparagus, and mushrooms.  Normally uric acid dissolves in your blood and passes through your kidneys into your urine.  But sometimes your body either produces too much uric acid or your kidneys excrete too little uric acid.  When this happens, uric acid can build up, forming sharp needle-like urate crystals in a joint or surrounding tissue that cause pain, inflammation and swelling.    Gout is characterized by sudden, severe attacks of pain, redness and tenderness in joints, often the joint at the base of the big toe.  Gout is complex form of arthritis that can affect anyone.  Men are more likely to get gout but women become increasingly more susceptible to gout after menopause.  An acute attack of gout can wake you up in the middle of the night with the sensation that your big toe is on fire.  The affected joint is hot, swollen and so tender that even the weight or the sheet on it may seem intolerable.  If you experience symptoms of an acute gout attack it is important to your doctor as soon as the symptoms start.  Gout that goes untreated can lead to worsening pain and joint damage.  Risk Factors:  You are more likely to develop gout if you have high levels of uric acid in your body.    Factors that increase the uric acid level in your body include:  Lifestyle factors.  Excessive alcohol use-generally more than two drinks a day for men and more than one for women increase the risk of gout.  Medical conditions.  Certain conditions make it  more likely that you will develop gout.  These include hypertension, and chronic conditions such as diabetes, high levels of fat and cholesterol in the blood, and narrowing of the arteries.  Certain medications.  The uses of Thiazide diuretics- commonly used to treat hypertension and low dose aspirin can also increase uric acid levels.  Family history of gout.  If other members of your family have had gout, you are more likely to develop the disease.  Age and sex. Gout occurs more often in men than it does in women, primarily because women tend to have lower uric acid levels than men do.  Men are more likely to develop gout earlier usually between the ages of 39-50- whereas women generally develop signs and symptoms after menopause.    Tests and diagnosis:  Tests to help diagnose gout may include:  Blood test.  Your doctor may recommend a blood test to measure the uric acid level in your blood .  Blood tests can be misleading, though.  Some people have high uric acid levels but never experience gout.  And some people have signs and symptoms of gout, but don't have unusual levels of uric acid in their blood.  Joint fluid test.  Your doctor may use a needle to draw fluid from your affected joint.  When examined under the microscope, your joint fluid may reveal urate crystals.  Treatment:  Treatment for gout usually involves medications.  What  medications you and your doctor choose will be based on your current health and other medications you currently take.  Gout medications can be used to treat acute gout attacks and prevent future attacks as well as reduce your risk of complications from gout such as the development of tophi from urate crystal deposits.  Alternative medicine:   Certain foods have been studied for their potential to lower uric acid levels, including:  Coffee.  Studies have found an association between coffee drinking (regular and decaf) and lower uric acid levels.  The  evidence is not enough to encourage non-coffee drinkers to start, but it may give clues to new ways of treating gout in the future.  Vitamin C.  Supplements containing vitamin C may reduce the levels of uric acid in your blood.  However, vitamin as a treatment for gout. Don't assume that if a little vitamin C is good, than lots is better.  Megadoses of vitamin C may increase your bodies uric acid levels.  Cherries.  Cherries have been associated with lower levels of uric acid in studies, but it isn't clear if they have any effect on gout signs and symptoms.  Eating more cherries and other dar-colored fruits, such as blackberries, blueberries, purple grapes and raspberries, may be a safe way to support your gout treatment.    Lifestyle/Diet Recommendations:   Drink 8 to 16 cups ( about 2 to 4 liters) of fluid each day, with at least half being water.  Avoid alcohol  Eat a moderate amount of protein, preferably from healthy sources, such as low-fat or fat-free dairy, tofu, eggs, and nut butters.  Limit you daily intake of meat, fish, and poultry to 4 to 6 ounces.  Avoid high fat meats and desserts.  Decrease you intake of shellfish, beef, lamb, pork, eggs and cheese.  Choose a good source of vitamin C daily such as citrus fruits, strawberries, broccoli,  brussel sprouts, papaya, and cantaloupe.   Choose a good source of vitamin A every other day such as yellow fruits, or dark green/yellow vegetables.  Avoid drastic weigh reduction or fasting.  If weigh loss is desired lose it over a period of several months.  See "dietary considerations.." chart for specific food recommendations.  Dietary Considerations for people with Gout  Food with negligible purine content (0-15 mg of purine nitrogen per 100 grams food)  May use as desired except on calorie variations  Non fat milk Cocoa Cereals (except in list II) Hard candies  Buttermilk Carbonated drinks Vegetables (except in list II) Sherbet   Coffee Fruits Sugar Honey  Tea Cottage Cheese Gelatin-jell-o Salt  Fruit juice Breads Angel food Cake   Herbs/spices Jams/Jellies [employer]    Foods that do not contain excessive purine content, but must be limited due to fat content  Cream Eggs Oil and Salad Dressing  Half and Half Peanut Butter Chocolate  Whole Milk Cakes Potato Chips  Butter Ice Cream Fried Foods  Cheese Nuts Waffles, pancakes   List II: Food with moderate purine content (50-150 mg of purine nitrogen per 100 grams of food)  Limit total amount each day to 5 oz. cooked Lean meat, other than those on list III   Poultry, other than those on list III Fish, other than those on list III   Seafood, other than those on list III  These foods may be used occasionally  Peas Lentils Bran  Spinach Oatmeal Dried Beans and Peas  Asparagus Wheat Germ Mushrooms  Additional information about meat choices  Choose fish and poultry, particularly without skin, often.  Select lean, well trimmed cuts of meat.  Avoid all fatty meats, bacon , sausage, fried meats, fried fish, or poultry, luncheon meats, cold cuts, hot dogs, meats canned or frozen in gravy, spareribs and frozen and packaged prepared meats.   List III: Foods with HIGH purine content / Foods to AVOID (150-800 mg of purine nitrogen per 100 grams of food)  Anchovies Herring Meat Broths  Liver Mackerel Meat Extracts  Kidney Scallops Meat Drippings  Sardines Wild Game Mincemeat  Sweetbreads Goose Gravy  Heart Tongue Yeast, baker's and brewers   Commercial soups made with any of the foods listed in List II or List III  In addition avoid all alcoholic beverages

## 2016-04-12 NOTE — Progress Notes (Signed)
Assessment and Plan:  Essential hypertension - continue medications, DASH diet, exercise and monitor at home. Call if greater than 130/80.  -     CBC with Differential/Platelet -     BASIC METABOLIC PANEL WITH GFR -     Hepatic function panel -     TSH  Diabetes mellitus without complication ([place]) Discussed general issues about diabetes pathophysiology and management., Educational material distributed., Suggested low cholesterol diet., Encouraged aerobic exercise., Discussed foot care., Reminded to get yearly retinal exam. -     BASIC METABOLIC PANEL WITH GFR -     Hemoglobin A1c  Hyperlipidemia -continue medications, check lipids, decrease fatty foods, increase activity.  -     Lipid panel  Morbid obesity due to excess calories ([place]) - long discussion about weight loss, diet, and exercise -     Lipid panel -     Hemoglobin A1c  Medication management -     Magnesium  Idiopathic gout, unspecified chronicity, unspecified site Take 1/2 pill and will recheck uric acid next OV   Continue diet and meds as discussed. Further disposition pending results of labs. Discussed med's effects and SE's.    HPI 75 y.o. AA male  presents for 3 month follow up with hypertension, hyperlipidemia, diabetes and vitamin D.  His blood pressure has been controlled at home, today their BP is BP: 130/88.  He does workout. He denies chest pain, shortness of breath, dizziness  He is on cholesterol medication, zocor 40mg  and denies myalgias. His cholesterol is at goal. The cholesterol was:  01/04/2016: Cholesterol 152; HDL 83; LDL Cholesterol 60; Triglycerides 43  He has been working on diet and exercise for diabetes without complications, has had DM x 2005 but has done a good job with diet/exercise/Metformin 2000mg  total bringing from DM range to preDM range, he is on bASA, he is on ACE/ARB, and denies  paresthesia of the feet, polydipsia, polyuria and visual disturbances. Last A1C was: 01/04/2016: Hgb A1c MFr  Bld 5.9  Patient is on Vitamin D supplement. 01/04/2016: Vit D, 25-Hydroxy 78  Patient is on allopurinol for gout, states if he takes a whole it causes pain so he has been on a half, and does not report a recent flare.  Lab Results  Component Value Date   LABURIC 6.0 01/04/2016   BMI is Body mass index is 38.5 kg/m., he is working on diet and exercise. Wt Readings from Last 3 Encounters:  04/12/16 291 lb 12.8 oz (132.4 kg)  01/04/16 288 lb (130.6 kg)  10/01/15 288 lb (130.6 kg)     Current Medications:  Current Outpatient Prescriptions on File Prior to Visit  Medication Sig Dispense Refill  . allopurinol (ZYLOPRIM) 300 MG tablet TAKE ONE TABLET BY MOUTH ONCE DAILY FOR GOUT 90 tablet 1  . ALPRAZolam (XANAX) 1 MG tablet Take 1/2 to 1 tablet 3 x day if needed for anxiety or sleep 90 tablet 5  . aspirin (ASPIRIN LOW DOSE) 81 MG tablet Take 81 mg by mouth as needed.      . bisoprolol-hydrochlorothiazide (ZIAC) 5-6.25 MG tablet Take 1 tablet by mouth daily. 90 tablet 0  . bumetanide (BUMEX) 2 MG tablet TAKE ONE TABLET BY MOUTH TWICE DAILY 60 tablet 0  . Cholecalciferol (VITAMIN D PO) Take 5,000 Units by mouth. Takes 5000 iu on Mon and Thur and 10000 iu the other 5 days of week    . IRON PO Take 18 mg by mouth daily.    Marland Kitchen lisinopril (  PRINIVIL,ZESTRIL) 20 MG tablet Take 1 tablet (20 mg total) by mouth daily. 90 tablet 1  . MAGNESIUM PO Take 500 mg by mouth 2 (two) times daily.     . metFORMIN (GLUCOPHAGE-XR) 500 MG 24 hr tablet TAKE TWO TABLETS BY MOUTH TWICE DAILY FOR DIABETES 360 tablet 3  . simvastatin (ZOCOR) 40 MG tablet TAKE ONE TABLET BY MOUTH AT BEDTIME FOR CHOLESTEROL 90 tablet 0   No current facility-administered medications on file prior to visit.    Medical History:  Past Medical History:  Diagnosis Date  . Adenomatous colon polyp    2010  . Gout   . Hyperlipidemia   . Hypertension   . Prostate cancer ([place])   . Type II or unspecified type diabetes mellitus without  mention of complication, not stated as uncontrolled   . Vitamin D deficiency    Allergies: No Known Allergies   Review of Systems:  Review of Systems  Constitutional: Negative.   HENT: Negative for congestion, ear discharge, ear pain, hearing loss, nosebleeds, sore throat and tinnitus.   Eyes: Negative.   Respiratory: Negative for cough, hemoptysis, sputum production, shortness of breath, wheezing and stridor.   Cardiovascular: Negative.   Gastrointestinal: Negative.   Genitourinary: Negative.        Occ incontinence with bumex  Musculoskeletal: Positive for joint pain. Negative for back pain, falls, myalgias and neck pain.  Skin: Negative.   Neurological: Negative.  Negative for headaches.  Endo/Heme/Allergies: Negative.   Psychiatric/Behavioral: Negative.     Family history- Review and unchanged Social history- Review and unchanged Physical Exam: BP 130/88   Pulse 70   Temp 97.5 F (36.4 C)   Resp 16   Ht 6\' 1"  (1.854 m)   Wt 291 lb 12.8 oz (132.4 kg)   SpO2 97%   BMI 38.50 kg/m  Wt Readings from Last 3 Encounters:  04/12/16 291 lb 12.8 oz (132.4 kg)  01/04/16 288 lb (130.6 kg)  10/01/15 288 lb (130.6 kg)   General Appearance: Well nourished, in no apparent distress. Eyes: PERRLA, EOMs, conjunctiva no swelling or erythema Sinuses: No Frontal/maxillary tenderness ENT/Mouth: Ext aud canals clear, TMs without erythema, bulging. No erythema, swelling, or exudate on post pharynx.  Tonsils not swollen or erythematous. Hearing normal.  Neck: Supple, thyroid normal.  Respiratory: Respiratory effort normal, BS equal bilaterally without rales, rhonchi, wheezing or stridor.  Cardio: RRR with no MRGs. Brisk peripheral pulses with 2+ edema.  Abdomen: Soft, + BS, obese  Non tender, no guarding, rebound, hernias, masses. Lymphatics: Non tender without lymphadenopathy.  Musculoskeletal: Full ROM, 5/5 strength, Normal gait Skin: Warm, dry without rashes, lesions, ecchymosis.   Neuro: Cranial nerves intact. No cerebellar symptoms.  Psych: Awake and oriented X 3, normal affect, Insight and Judgment appropriate.    Vicie Mutters, PA-C 11:04 AM [facility]

## 2016-04-13 LAB — HEMOGLOBIN A1C
Hgb A1c MFr Bld: 5.7 % — ABNORMAL HIGH (ref <5.7)
MEAN PLASMA GLUCOSE: 117 mg/dL

## 2016-04-24 ENCOUNTER — Other Ambulatory Visit: Payer: Self-pay | Admitting: Internal Medicine

## 2016-04-24 DIAGNOSIS — F411 Generalized anxiety disorder: Secondary | ICD-10-CM

## 2016-04-25 ENCOUNTER — Other Ambulatory Visit: Payer: Self-pay | Admitting: Internal Medicine

## 2016-04-25 DIAGNOSIS — F411 Generalized anxiety disorder: Secondary | ICD-10-CM

## 2016-05-03 DIAGNOSIS — N3020 Other chronic cystitis without hematuria: Secondary | ICD-10-CM | POA: Diagnosis not present

## 2016-05-03 DIAGNOSIS — Z8546 Personal history of malignant neoplasm of prostate: Secondary | ICD-10-CM | POA: Diagnosis not present

## 2016-05-03 DIAGNOSIS — C61 Malignant neoplasm of prostate: Secondary | ICD-10-CM | POA: Diagnosis not present

## 2016-05-23 ENCOUNTER — Other Ambulatory Visit: Payer: Self-pay | Admitting: *Deleted

## 2016-07-14 ENCOUNTER — Encounter: Payer: Self-pay | Admitting: Internal Medicine

## 2016-07-14 ENCOUNTER — Ambulatory Visit (INDEPENDENT_AMBULATORY_CARE_PROVIDER_SITE_OTHER): Payer: Medicare Other | Admitting: Internal Medicine

## 2016-07-14 VITALS — BP 150/92 | HR 76 | Temp 97.3°F | Resp 16 | Ht 74.0 in | Wt 285.2 lb

## 2016-07-14 DIAGNOSIS — E119 Type 2 diabetes mellitus without complications: Secondary | ICD-10-CM

## 2016-07-14 DIAGNOSIS — M1000 Idiopathic gout, unspecified site: Secondary | ICD-10-CM

## 2016-07-14 DIAGNOSIS — E559 Vitamin D deficiency, unspecified: Secondary | ICD-10-CM

## 2016-07-14 DIAGNOSIS — E782 Mixed hyperlipidemia: Secondary | ICD-10-CM | POA: Diagnosis not present

## 2016-07-14 DIAGNOSIS — Z79899 Other long term (current) drug therapy: Secondary | ICD-10-CM | POA: Diagnosis not present

## 2016-07-14 DIAGNOSIS — Z125 Encounter for screening for malignant neoplasm of prostate: Secondary | ICD-10-CM | POA: Diagnosis not present

## 2016-07-14 DIAGNOSIS — N320 Bladder-neck obstruction: Secondary | ICD-10-CM | POA: Diagnosis not present

## 2016-07-14 DIAGNOSIS — I10 Essential (primary) hypertension: Secondary | ICD-10-CM | POA: Diagnosis not present

## 2016-07-14 DIAGNOSIS — Z136 Encounter for screening for cardiovascular disorders: Secondary | ICD-10-CM

## 2016-07-14 DIAGNOSIS — Z1212 Encounter for screening for malignant neoplasm of rectum: Secondary | ICD-10-CM

## 2016-07-14 LAB — CBC WITH DIFFERENTIAL/PLATELET
BASOS PCT: 1 %
Basophils Absolute: 55 cells/uL (ref 0–200)
EOS PCT: 3 %
Eosinophils Absolute: 165 cells/uL (ref 15–500)
HCT: 39.1 % (ref 38.5–50.0)
Hemoglobin: 12.8 g/dL — ABNORMAL LOW (ref 13.2–17.1)
LYMPHS PCT: 21 %
Lymphs Abs: 1155 cells/uL (ref 850–3900)
MCH: 26.8 pg — ABNORMAL LOW (ref 27.0–33.0)
MCHC: 32.7 g/dL (ref 32.0–36.0)
MCV: 82 fL (ref 80.0–100.0)
MONOS PCT: 7 %
MPV: 10.1 fL (ref 7.5–12.5)
Monocytes Absolute: 385 cells/uL (ref 200–950)
NEUTROS ABS: 3740 {cells}/uL (ref 1500–7800)
Neutrophils Relative %: 68 %
PLATELETS: 235 10*3/uL (ref 140–400)
RBC: 4.77 MIL/uL (ref 4.20–5.80)
RDW: 15.3 % — AB (ref 11.0–15.0)
WBC: 5.5 10*3/uL (ref 3.8–10.8)

## 2016-07-14 NOTE — Progress Notes (Signed)
[facility]   [name], M.D.    Uvaldo Bristle. Silverio Lay, P.A.-C      Starlyn Skeans, P.A.-C  [facility]                [address] [place], [place] [id number] Telephone [phone] Telefax [phone] Comprehensive Evaluation & Examination     This very nice 75 y.o. DBM presents for a  comprehensive evaluation and management of multiple medical co-morbidities.  Patient has been followed for HTN, T2_NIDDM, Hyperlipidemia and Vitamin D Deficiency. Patient was dx'd w/Prostate Cancer in 2008 and was treated with ext Radiation x 6 weeks and is followed by and seen recently by Dr Risa Grill for DRE. Also patient has hx/o Gout apparently controlled on Allopurinol w/o recent recurrences.     Patient was dx'd with HTN circa 1989. In 2008 he had a false (+) Myoview vindicated with a normal heart cath. Patient's BP has been controlled at home.  Today's BP is sl elevated at 152/90 and rechecked at 140 /84. Marland Kitchen Patient denies any cardiac symptoms as chest pain, palpitations, shortness of breath, dizziness or ankle swelling.     Patient's hyperlipidemia is controlled with diet and medications. Patient denies myalgias or other medication SE's. Last lipids were at goal: Lab Results  Component Value Date   CHOL 143 04/12/2016   HDL 76 04/12/2016   LDLCALC 58 04/12/2016   TRIG 45 04/12/2016   CHOLHDL 1.9 04/12/2016      Patient has T2_NIDDM since 2005 and patient reports CBG's,100 mg%. Also, he denies reactive hypoglycemic symptoms, visual blurring, diabetic polys or paresthesias. Last A1c was near goal.  Lab Results  Component Value Date   HGBA1C 5.7 (H) 04/12/2016       Finally, patient has history of Vitamin D Deficiency in 2008 of "29"  and last vitamin D was at goal: Lab Results  Component Value Date   VD25OH 78 01/04/2016   Current Outpatient Prescriptions on File Prior to Visit  Medication Sig  . allopurinol  (ZYLOPRIM) 300 MG tablet TAKE ONE TABLET BY MOUTH ONCE DAILY FOR GOUT  . ALPRAZolam (XANAX) 1 MG tablet TAKE HALF TO ONE TABLET BY MOUTH THREE TIMES DAILY AS NEEDED FOR ANXIETY  . aspirin (ASPIRIN LOW DOSE) 81 MG tablet Take 81 mg by mouth as needed.    . bisoprolol-hydrochlorothiazide (ZIAC) 5-6.25 MG tablet Take 1 tablet by mouth daily.  . bumetanide (BUMEX) 2 MG tablet TAKE ONE TABLET BY MOUTH TWICE DAILY  . Cholecalciferol (VITAMIN D PO) Take 5,000 Units by mouth. Takes 5000 iu on Mon and Thur and 10000 iu the other 5 days of week  . IRON PO Take 18 mg by mouth daily.  Marland Kitchen lisinopril (PRINIVIL,ZESTRIL) 20 MG tablet Take 1 tablet (20 mg total) by mouth daily.  Marland Kitchen MAGNESIUM PO Take 500 mg by mouth 2 (two) times daily.   . metFORMIN (GLUCOPHAGE-XR) 500 MG 24 hr tablet TAKE TWO TABLETS BY MOUTH TWICE DAILY FOR DIABETES  . simvastatin (ZOCOR) 40 MG tablet TAKE ONE TABLET BY MOUTH AT BEDTIME FOR CHOLESTEROL   No current facility-administered medications on file prior to visit.    No Known Allergies Past Medical History:  Diagnosis Date  . Adenomatous colon polyp    2010  . Gout   . Hyperlipidemia   . Hypertension   . Prostate cancer ([place])   .  Type II or unspecified type diabetes mellitus without mention of complication, not stated as uncontrolled   . Vitamin D deficiency    Health Maintenance  Topic Date Due  . INFLUENZA VACCINE  02/15/2016  . FOOT EXAM  06/27/2016  . ZOSTAVAX  10/19/2016 (Originally 06/09/2001)  . HEMOGLOBIN A1C  10/10/2016  . OPHTHALMOLOGY EXAM  11/01/2016  . COLONOSCOPY  07/21/2021  . TETANUS/TDAP  03/21/2022  . PNA vac Low Risk Adult  Completed   Immunization History  Administered Date(s) Administered  . DT 03/21/2012  . Influenza-Unspecified 04/16/2014, 04/16/2015  . Pneumococcal Conjugate-13 05/19/2014  . Pneumococcal Polysaccharide-23 07/24/2007   Past Surgical History:  Procedure Laterality Date  . APPENDECTOMY  1994   and GALLBLADDER  .  CHOLECYSTECTOMY  1994  . colovesical fistula repair  01/2011  . KNEE SURGERY  1996   Family History  Problem Relation Age of Onset  . Colon cancer Neg Hx   . Esophageal cancer Neg Hx   . Rectal cancer Neg Hx   . Stomach cancer Neg Hx    Social History   Social History  . Marital status: Divorced    Spouse name: N/A  . Number of children: N/A  . Years of education: N/A   Occupational History  . 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 Volunteers 3 x/week at [facility] [employer] in [place].   Social History Main Topics  . Smoking status: Former Smoker    Quit date: 07/17/1985  . Smokeless tobacco: Never Used  . Alcohol use No  . Drug use: No  . Sexual activity: Not on file    ROS Constitutional: Denies fever, chills, weight loss/gain, headaches, insomnia,  night sweats or change in appetite. Does c/o fatigue. Eyes: Denies redness, blurred vision, diplopia, discharge, itchy or watery eyes.  ENT: Denies discharge, congestion, post nasal drip, epistaxis, sore throat, earache, hearing loss, dental pain, Tinnitus, Vertigo, Sinus pain or snoring.  Cardio: Denies chest pain, palpitations, irregular heartbeat, syncope, dyspnea, diaphoresis, orthopnea, PND, claudication or edema Respiratory: denies cough, dyspnea, DOE, pleurisy, hoarseness, laryngitis or wheezing.  Gastrointestinal: Denies dysphagia, heartburn, reflux, water brash, pain, cramps, nausea, vomiting, bloating, diarrhea, constipation, hematemesis, melena, hematochezia, jaundice or hemorrhoids Genitourinary: Denies dysuria, frequency, urgency, nocturia, hesitancy, discharge, hematuria or flank pain Musculoskeletal: Denies arthralgia, myalgia, stiffness, Jt. Swelling, pain, limp or strain/sprain. Denies Falls. Skin: Denies puritis, rash, hives, warts, acne, eczema or change in skin lesion Neuro: No weakness, tremor, incoordination, spasms, paresthesia or pain Psychiatric: Denies confusion, memory loss or sensory loss.  Denies Depression. Endocrine: Denies change in weight, skin, hair change, nocturia, and paresthesia, diabetic polys, visual blurring or hyper / hypo glycemic episodes.  Heme/Lymph: No excessive bleeding, bruising or enlarged lymph nodes.  Physical Exam  BP 150/92-> 140/84   P 76   T97.3 F   R 16   Ht 6\' 2"     Wt 285 lb    BMI 36.62  General Appearance: Over nourished with central Obesity and in no apparent distress.  Eyes: PERRLA, EOMs, conjunctiva no swelling or erythema, normal fundi and vessels. Sinuses: No frontal/maxillary tenderness ENT/Mouth: EACs patent / TMs  nl. Nares clear without erythema, swelling, mucoid exudates. Oral hygiene is good. No erythema, swelling, or exudate. Tongue normal, non-obstructing. Tonsils not swollen or erythematous. Hearing normal.  Neck: Supple, thyroid normal. No bruits, nodes or JVD. Respiratory: Respiratory effort normal.  BS equal and clear bilateral without rales, rhonci, wheezing or stridor. Cardio: Heart sounds are normal with regular  rate and rhythm and no murmurs, rubs or gallops. Peripheral pulses are normal and equal bilaterally without edema. No aortic or femoral bruits. Chest: symmetric with normal excursions and percussion.  Abdomen: Soft, rotund with Nl bowel sounds. Nontender, no guarding, rebound, hernias, masses, or organomegaly.  Lymphatics: Non tender without lymphadenopathy.  Genitourinary: Deferred - done recently by Dr Risa Grill. Musculoskeletal: Full ROM all peripheral extremities, joint stability, 5/5 strength, and normal gait. Skin: Warm and dry without rashes, lesions, cyanosis, clubbing or  ecchymosis.  Neuro: Cranial nerves intact, reflexes equal bilaterally. Normal muscle tone, no cerebellar symptoms. Sensation intact to touch , Vibratory and Monofilament to the toes bilaterally.  Pysch: Alert and oriented X 3 with normal affect, insight and judgment appropriate.   Assessment and Plan  1. Essential hypertension  -  Microalbumin / creatinine urine ratio - EKG 12-Lead - [place], RETROPERITNL ABD,  LTD - Urinalysis, Routine w reflex microscopic - CBC with Differential/Platelet - BASIC METABOLIC PANEL WITH GFR - TSH  2. Mixed hyperlipidemia  - EKG 12-Lead - [place], RETROPERITNL ABD,  LTD - Hepatic function panel - Lipid panel - TSH  3. Diabetes mellitus without complication (HCC)  - Microalbumin / creatinine urine ratio - EKG 12-Lead - [place], RETROPERITNL ABD,  LTD - HM DIABETES FOOT EXAM - LOW EXTREMITY NEUR EXAM DOCUM - Hemoglobin A1c - Insulin, random  4. Vitamin D deficiency  - VITAMIN D 25 Hydroxy  5. Idiopathic gout  - Uric acid  6. Screening for rectal cancer  - POC Hemoccult Bld/Stl   7. Prostate cancer screening  - PSA  8. Bladder neck obstruction  - PSA  9. Screening for ischemic heart disease  - EKG 12-Lead  10. Screening for AAA (aortic abdominal aneurysm)  - [place], RETROPERITNL ABD,  LTD  11. Medication management  - Urinalysis, Routine w reflex microscopic - CBC with Differential/Platelet - BASIC METABOLIC PANEL WITH GFR - Hepatic function panel - Magnesium       Continue prudent diet as discussed, weight control, BP monitoring, regular exercise, and medications as discussed.  Discussed med effects and SE's. Routine screening labs and tests as requested with regular follow-up as recommended. Over 40 minutes of exam, counseling, chart review and high complex critical decision making was performed

## 2016-07-14 NOTE — Patient Instructions (Signed)
Preventive Care for Adults A healthy lifestyle and preventive care can promote health and wellness. Preventive health guidelines for men include the following key practices:  A routine yearly physical is a good way to check with your health care provider about your health and preventative screening. It is a chance to share any concerns and updates on your health and to receive a thorough exam.  Visit your dentist for a routine exam and preventative care every 6 months. Brush your teeth twice a day and floss once a day. Good oral hygiene prevents tooth decay and gum disease.  The frequency of eye exams is based on your age, health, family medical history, use of contact lenses, and other factors. Follow your health care provider's recommendations for frequency of eye exams.  Eat a healthy diet. Foods such as vegetables, fruits, whole grains, low-fat dairy products, and lean protein foods contain the nutrients you need without too many calories. Decrease your intake of foods high in solid fats, added sugars, and salt. Eat the right amount of calories for you.Get information about a proper diet from your health care provider, if necessary.  Regular physical exercise is one of the most important things you can do for your health. Most adults should get at least 150 minutes of moderate-intensity exercise (any activity that increases your heart rate and causes you to sweat) each week. In addition, most adults need muscle-strengthening exercises on 2 or more days a week.  Maintain a healthy weight. The body mass index (BMI) is a screening tool to identify possible weight problems. It provides an estimate of body fat based on height and weight. Your health care provider can find your BMI and can help you achieve or maintain a healthy weight.For adults 20 years and older:  A BMI below 18.5 is considered underweight.  A BMI of 18.5 to 24.9 is normal.  A BMI of 25 to 29.9 is considered overweight.  A BMI  of 30 and above is considered obese.  Maintain normal blood lipids and cholesterol levels by exercising and minimizing your intake of saturated fat. Eat a balanced diet with plenty of fruit and vegetables. Blood tests for lipids and cholesterol should begin at age 50 and be repeated every 5 years. If your lipid or cholesterol levels are high, you are over 50, or you are at high risk for heart disease, you may need your cholesterol levels checked more frequently.Ongoing high lipid and cholesterol levels should be treated with medicines if diet and exercise are not working.  If you smoke, find out from your health care provider how to quit. If you do not use tobacco, do not start.  Lung cancer screening is recommended for adults aged 73-80 years who are at high risk for developing lung cancer because of a history of smoking. A yearly low-dose CT scan of the lungs is recommended for people who have at least a 30-pack-year history of smoking and are a current smoker or have quit within the past 15 years. A pack year of smoking is smoking an average of 1 pack of cigarettes a day for 1 year (for example: 1 pack a day for 30 years or 2 packs a day for 15 years). Yearly screening should continue until the smoker has stopped smoking for at least 15 years. Yearly screening should be stopped for people who develop a health problem that would prevent them from having lung cancer treatment.  If you choose to drink alcohol, do not have more than  2 drinks per day. One drink is considered to be 12 ounces (355 mL) of beer, 5 ounces (148 mL) of wine, or 1.5 ounces (44 mL) of liquor.  Avoid use of street drugs. Do not share needles with anyone. Ask for help if you need support or instructions about stopping the use of drugs.  High blood pressure causes heart disease and increases the risk of stroke. Your blood pressure should be checked at least every 1-2 years. Ongoing high blood pressure should be treated with  medicines, if weight loss and exercise are not effective.  If you are 45-79 years old, ask your health care provider if you should take aspirin to prevent heart disease.  Diabetes screening involves taking a blood sample to check your fasting blood sugar level. This should be done once every 3 years, after age 45, if you are within normal weight and without risk factors for diabetes. Testing should be considered at a younger age or be carried out more frequently if you are overweight and have at least 1 risk factor for diabetes.  Colorectal cancer can be detected and often prevented. Most routine colorectal cancer screening begins at the age of 50 and continues through age 75. However, your health care provider may recommend screening at an earlier age if you have risk factors for colon cancer. On a yearly basis, your health care provider may provide home test kits to check for hidden blood in the stool. Use of a small camera at the end of a tube to directly examine the colon (sigmoidoscopy or colonoscopy) can detect the earliest forms of colorectal cancer. Talk to your health care provider about this at age 50, when routine screening begins. Direct exam of the colon should be repeated every 5-10 years through age 75, unless early forms of precancerous polyps or small growths are found.  People who are at an increased risk for hepatitis B should be screened for this virus. You are considered at high risk for hepatitis B if:  You were born in a country where hepatitis B occurs often. Talk with your health care provider about which countries are considered high risk.  Your parents were born in a high-risk country and you have not received a shot to protect against hepatitis B (hepatitis B vaccine).  You have HIV or AIDS.  You use needles to inject street drugs.  You live with, or have sex with, someone who has hepatitis B.  You are a man who has sex with other men (MSM).  You get hemodialysis  treatment.  You take certain medicines for conditions such as cancer, organ transplantation, and autoimmune conditions.  Hepatitis C blood testing is recommended for all people born from 1945 through 1965 and any individual with known risks for hepatitis C.  Practice safe sex. Use condoms and avoid high-risk sexual practices to reduce the spread of sexually transmitted infections (STIs). STIs include gonorrhea, chlamydia, syphilis, trichomonas, herpes, HPV, and human immunodeficiency virus (HIV). Herpes, HIV, and HPV are viral illnesses that have no cure. They can result in disability, cancer, and death.  If you are at risk of being infected with HIV, it is recommended that you take a prescription medicine daily to prevent HIV infection. This is called preexposure prophylaxis (PrEP). You are considered at risk if:  You are a man who has sex with other men (MSM) and have other risk factors.  You are a heterosexual man, are sexually active, and are at increased risk for HIV infection.    You take drugs by injection.  You are sexually active with a partner who has HIV.  Talk with your health care provider about whether you are at high risk of being infected with HIV. If you choose to begin PrEP, you should first be tested for HIV. You should then be tested every 3 months for as long as you are taking PrEP.  A one-time screening for abdominal aortic aneurysm (AAA) and surgical repair of large AAAs by ultrasound are recommended for men ages 32 to 67 years who are current or former smokers.  Healthy men should no longer receive prostate-specific antigen (PSA) blood tests as part of routine cancer screening. Talk with your health care provider about prostate cancer screening.  Testicular cancer screening is not recommended for adult males who have no symptoms. Screening includes self-exam, a health care provider exam, and other screening tests. Consult with your health care provider about any symptoms  you have or any concerns you have about testicular cancer.  Use sunscreen. Apply sunscreen liberally and repeatedly throughout the day. You should seek shade when your shadow is shorter than you. Protect yourself by wearing long sleeves, pants, a wide-brimmed hat, and sunglasses year round, whenever you are outdoors.  Once a month, do a whole-body skin exam, using a mirror to look at the skin on your back. Tell your health care provider about new moles, moles that have irregular borders, moles that are larger than a pencil eraser, or moles that have changed in shape or color.  Stay current with required vaccines (immunizations).  Influenza vaccine. All adults should be immunized every year.  Tetanus, diphtheria, and acellular pertussis (Td, Tdap) vaccine. An adult who has not previously received Tdap or who does not know his vaccine status should receive 1 dose of Tdap. This initial dose should be followed by tetanus and diphtheria toxoids (Td) booster doses every 10 years. Adults with an unknown or incomplete history of completing a 3-dose immunization series with Td-containing vaccines should begin or complete a primary immunization series including a Tdap dose. Adults should receive a Td booster every 10 years.  Varicella vaccine. An adult without evidence of immunity to varicella should receive 2 doses or a second dose if he has previously received 1 dose.  Human papillomavirus (HPV) vaccine. Males aged 68-21 years who have not received the vaccine previously should receive the 3-dose series. Males aged 22-26 years may be immunized. Immunization is recommended through the age of 6 years for any male who has sex with males and did not get any or all doses earlier. Immunization is recommended for any person with an immunocompromised condition through the age of 49 years if he did not get any or all doses earlier. During the 3-dose series, the second dose should be obtained 4-8 weeks after the first  dose. The third dose should be obtained 24 weeks after the first dose and 16 weeks after the second dose.  Zoster vaccine. One dose is recommended for adults aged 50 years or older unless certain conditions are present.  Measles, mumps, and rubella (MMR) vaccine. Adults born before [birth year] generally are considered immune to measles and mumps. Adults born in [birth year] or later should have 1 or more doses of MMR vaccine unless there is a contraindication to the vaccine or there is laboratory evidence of immunity to each of the three diseases. A routine second dose of MMR vaccine should be obtained at least 28 days after the first dose for students attending postsecondary  schools, health care workers, or international travelers. People who received inactivated measles vaccine or an unknown type of measles vaccine during 1963-1967 should receive 2 doses of MMR vaccine. People who received inactivated mumps vaccine or an unknown type of mumps vaccine before 1979 and are at high risk for mumps infection should consider immunization with 2 doses of MMR vaccine. Unvaccinated health care workers born before 1957 who lack laboratory evidence of measles, mumps, or rubella immunity or laboratory confirmation of disease should consider measles and mumps immunization with 2 doses of MMR vaccine or rubella immunization with 1 dose of MMR vaccine.  Pneumococcal 13-valent conjugate (PCV13) vaccine. When indicated, a person who is uncertain of his immunization history and has no record of immunization should receive the PCV13 vaccine. An adult aged 19 years or older who has certain medical conditions and has not been previously immunized should receive 1 dose of PCV13 vaccine. This PCV13 should be followed with a dose of pneumococcal polysaccharide (PPSV23) vaccine. The PPSV23 vaccine dose should be obtained at least 8 weeks after the dose of PCV13 vaccine. An adult aged 19 years or older who has certain medical conditions and  previously received 1 or more doses of PPSV23 vaccine should receive 1 dose of PCV13. The PCV13 vaccine dose should be obtained 1 or more years after the last PPSV23 vaccine dose.  Pneumococcal polysaccharide (PPSV23) vaccine. When PCV13 is also indicated, PCV13 should be obtained first. All adults aged 65 years and older should be immunized. An adult younger than age 65 years who has certain medical conditions should be immunized. Any person who resides in a nursing home or long-term care facility should be immunized. An adult smoker should be immunized. People with an immunocompromised condition and certain other conditions should receive both PCV13 and PPSV23 vaccines. People with human immunodeficiency virus (HIV) infection should be immunized as soon as possible after diagnosis. Immunization during chemotherapy or radiation therapy should be avoided. Routine use of PPSV23 vaccine is not recommended for American Indians, [place] Natives, or people younger than 65 years unless there are medical conditions that require PPSV23 vaccine. When indicated, people who have unknown immunization and have no record of immunization should receive PPSV23 vaccine. One-time revaccination 5 years after the first dose of PPSV23 is recommended for people aged 19-64 years who have chronic kidney failure, nephrotic syndrome, asplenia, or immunocompromised conditions. People who received 1-2 doses of PPSV23 before age 65 years should receive another dose of PPSV23 vaccine at age 65 years or later if at least 5 years have passed since the previous dose. Doses of PPSV23 are not needed for people immunized with PPSV23 at or after age 65 years.  Meningococcal vaccine. Adults with asplenia or persistent complement component deficiencies should receive 2 doses of quadrivalent meningococcal conjugate (MenACWY-D) vaccine. The doses should be obtained at least 2 months apart. Microbiologists working with certain meningococcal bacteria,  military recruits, people at risk during an outbreak, and people who travel to or live in countries with a high rate of meningitis should be immunized. A first-year college student up through age 21 years who is living in a residence hall should receive a dose if he did not receive a dose on or after his 16th birthday. Adults who have certain high-risk conditions should receive one or more doses of vaccine.  Hepatitis A vaccine. Adults who wish to be protected from this disease, have certain high-risk conditions, work with hepatitis A-infected animals, work in hepatitis A research labs, or   travel to or work in countries with a high rate of hepatitis A should be immunized. Adults who were previously unvaccinated and who anticipate close contact with an international adoptee during the first 60 days after arrival in the [place] from a country with a high rate of hepatitis A should be immunized.  Hepatitis B vaccine. Adults should be immunized if they wish to be protected from this disease, have certain high-risk conditions, may be exposed to blood or other infectious body fluids, are household contacts or sex partners of hepatitis B positive people, are clients or workers in certain care facilities, or travel to or work in countries with a high rate of hepatitis B.  Haemophilus influenzae type b (Hib) vaccine. A previously unvaccinated person with asplenia or sickle cell disease or having a scheduled splenectomy should receive 1 dose of Hib vaccine. Regardless of previous immunization, a recipient of a hematopoietic stem cell transplant should receive a 3-dose series 6-12 months after his successful transplant. Hib vaccine is not recommended for adults with HIV infection. Preventive Service / Frequency Ages 52 to 17  Blood pressure check.** / Every 1 to 2 years.  Lipid and cholesterol check.** / Every 5 years beginning at age 69.  Hepatitis C blood test.** / For any individual with known risks for  hepatitis C.  Skin self-exam. / Monthly.  Influenza vaccine. / Every year.  Tetanus, diphtheria, and acellular pertussis (Tdap, Td) vaccine.** / Consult your health care provider. 1 dose of Td every 10 years.  Varicella vaccine.** / Consult your health care provider.  HPV vaccine. / 3 doses over 6 months, if 72 or younger.  Measles, mumps, rubella (MMR) vaccine.** / You need at least 1 dose of MMR if you were born in 1957 or later. You may also need a second dose.  Pneumococcal 13-valent conjugate (PCV13) vaccine.** / Consult your health care provider.  Pneumococcal polysaccharide (PPSV23) vaccine.** / 1 to 2 doses if you smoke cigarettes or if you have certain conditions.  Meningococcal vaccine.** / 1 dose if you are age 35 to 60 years and a Market researcher living in a residence hall, or have one of several medical conditions. You may also need additional booster doses.  Hepatitis A vaccine.** / Consult your health care provider.  Hepatitis B vaccine.** / Consult your health care provider.  Haemophilus influenzae type b (Hib) vaccine.** / Consult your health care provider. Ages 35 to 8  Blood pressure check.** / Every 1 to 2 years.  Lipid and cholesterol check.** / Every 5 years beginning at age 57.  Lung cancer screening. / Every year if you are aged 44-80 years and have a 30-pack-year history of smoking and currently smoke or have quit within the past 15 years. Yearly screening is stopped once you have quit smoking for at least 15 years or develop a health problem that would prevent you from having lung cancer treatment.  Fecal occult blood test (FOBT) of stool. / Every year beginning at age 55 and continuing until age 73. You may not have to do this test if you get a colonoscopy every 10 years.  Flexible sigmoidoscopy** or colonoscopy.** / Every 5 years for a flexible sigmoidoscopy or every 10 years for a colonoscopy beginning at age 28 and continuing until age  1.  Hepatitis C blood test.** / For all people born from [birth year] through 1965 and any individual with known risks for hepatitis C.  Skin self-exam. / Monthly.  Influenza vaccine. / Every  year.  Tetanus, diphtheria, and acellular pertussis (Tdap/Td) vaccine.** / Consult your health care provider. 1 dose of Td every 10 years.  Varicella vaccine.** / Consult your health care provider.  Zoster vaccine.** / 1 dose for adults aged 31 years or older.  Measles, mumps, rubella (MMR) vaccine.** / You need at least 1 dose of MMR if you were born in 1957 or later. You may also need a second dose.  Pneumococcal 13-valent conjugate (PCV13) vaccine.** / Consult your health care provider.  Pneumococcal polysaccharide (PPSV23) vaccine.** / 1 to 2 doses if you smoke cigarettes or if you have certain conditions.  Meningococcal vaccine.** / Consult your health care provider.  Hepatitis A vaccine.** / Consult your health care provider.  Hepatitis B vaccine.** / Consult your health care provider.  Haemophilus influenzae type b (Hib) vaccine.** / Consult your health care provider. Ages 58 and over  Blood pressure check.** / Every 1 to 2 years.  Lipid and cholesterol check.**/ Every 5 years beginning at age 69.  Lung cancer screening. / Every year if you are aged 55-80 years and have a 30-pack-year history of smoking and currently smoke or have quit within the past 15 years. Yearly screening is stopped once you have quit smoking for at least 15 years or develop a health problem that would prevent you from having lung cancer treatment.  Fecal occult blood test (FOBT) of stool. / Every year beginning at age 69 and continuing until age 36. You may not have to do this test if you get a colonoscopy every 10 years.  Flexible sigmoidoscopy** or colonoscopy.** / Every 5 years for a flexible sigmoidoscopy or every 10 years for a colonoscopy beginning at age 55 and continuing until age 39.  Hepatitis C blood  test.** / For all people born from [birth year] through 1965 and any individual with known risks for hepatitis C.  Abdominal aortic aneurysm (AAA) screening./ Screening current or former smokers or have Hypertension.  Skin self-exam. / Monthly.  Influenza vaccine. / Every year.  Tetanus, diphtheria, and acellular pertussis (Tdap/Td) vaccine.** / 1 dose of Td every 10 years.  Varicella vaccine.** / Consult your health care provider.  Zoster vaccine.** / 1 dose for adults aged 43 years or older.  Pneumococcal 13-valent conjugate (PCV13) vaccine.** / Consult your health care provider.  Pneumococcal polysaccharide (PPSV23) vaccine.** / 1 dose for all adults aged 12 years and older.  Meningococcal vaccine.** / Consult your health care provider.  Hepatitis A vaccine.** / Consult your health care provider.  Hepatitis B vaccine.** / Consult your health care provider.  Haemophilus influenzae type b (Hib) vaccine.** / Consult your health care provider.  Health Maintenance A healthy lifestyle and preventative care can promote health and wellness. Maintain regular health, dental, and eye exams. Eat a healthy diet. Foods like vegetables, fruits, whole grains, low-fat dairy products, and lean protein foods contain the nutrients you need and are low in calories. Decrease your intake of foods high in solid fats, added sugars, and salt. Get information about a proper diet from your health care provider, if necessary. Regular physical exercise is one of the most important things you can do for your health. Most adults should get at least 150 minutes of moderate-intensity exercise (any activity that increases your heart rate and causes you to sweat) each week. In addition, most adults need muscle-strengthening exercises on 2 or more days a week.  Maintain a healthy weight. The body mass index (BMI) is a screening tool to identify  possible weight problems. It provides an estimate of body fat based on height and  weight. Your health care provider can find your BMI and can help you achieve or maintain a healthy weight. For males 20 years and older: A BMI below 18.5 is considered underweight. A BMI of 18.5 to 24.9 is normal. A BMI of 25 to 29.9 is considered overweight. A BMI of 30 and above is considered obese. Maintain normal blood lipids and cholesterol by exercising and minimizing your intake of saturated fat. Eat a balanced diet with plenty of fruits and vegetables. Blood tests for lipids and cholesterol should begin at age 20 and be repeated every 5 years. If your lipid or cholesterol levels are high, you are over age 50, or you are at high risk for heart disease, you may need your cholesterol levels checked more frequently.Ongoing high lipid and cholesterol levels should be treated with medicines if diet and exercise are not working. If you smoke, find out from your health care provider how to quit. If you do not use tobacco, do not start. Lung cancer screening is recommended for adults aged 55-80 years who are at high risk for developing lung cancer because of a history of smoking. A yearly low-dose CT scan of the lungs is recommended for people who have at least a 30-pack-year history of smoking and are current smokers or have quit within the past 15 years. A pack year of smoking is smoking an average of 1 pack of cigarettes a day for 1 year (for example, a 30-pack-year history of smoking could mean smoking 1 pack a day for 30 years or 2 packs a day for 15 years). Yearly screening should continue until the smoker has stopped smoking for at least 15 years. Yearly screening should be stopped for people who develop a health problem that would prevent them from having lung cancer treatment. If you choose to drink alcohol, do not have more than 2 drinks per day. One drink is considered to be 12 oz (360 mL) of beer, 5 oz (150 mL) of wine, or 1.5 oz (45 mL) of liquor. Avoid the use of street drugs. Do not share  needles with anyone. Ask for help if you need support or instructions about stopping the use of drugs. High blood pressure causes heart disease and increases the risk of stroke. Blood pressure should be checked at least every 1-2 years. Ongoing high blood pressure should be treated with medicines if weight loss and exercise are not effective. If you are 45-79 years old, ask your health care provider if you should take aspirin to prevent heart disease. Diabetes screening involves taking a blood sample to check your fasting blood sugar level. This should be done once every 3 years after age 45 if you are at a normal weight and without risk factors for diabetes. Testing should be considered at a younger age or be carried out more frequently if you are overweight and have at least 1 risk factor for diabetes. Colorectal cancer can be detected and often prevented. Most routine colorectal cancer screening begins at the age of 50 and continues through age 75. However, your health care provider may recommend screening at an earlier age if you have risk factors for colon cancer. On a yearly basis, your health care provider may provide home test kits to check for hidden blood in the stool. A small camera at the end of a tube may be used to directly examine the colon (sigmoidoscopy or colonoscopy)   to detect the earliest forms of colorectal cancer. Talk to your health care provider about this at age 52 when routine screening begins. A direct exam of the colon should be repeated every 5-10 years through age 37, unless early forms of precancerous polyps or small growths are found. People who are at an increased risk for hepatitis B should be screened for this virus. You are considered at high risk for hepatitis B if: You were born in a country where hepatitis B occurs often. Talk with your health care provider about which countries are considered high risk. Your parents were born in a high-risk country and you have not  received a shot to protect against hepatitis B (hepatitis B vaccine). You have HIV or AIDS. You use needles to inject street drugs. You live with, or have sex with, someone who has hepatitis B. You are a man who has sex with other men (MSM). You get hemodialysis treatment. You take certain medicines for conditions like cancer, organ transplantation, and autoimmune conditions. Hepatitis C blood testing is recommended for all people born from [birth year] through 1965 and any individual with known risk factors for hepatitis C. Healthy men should no longer receive prostate-specific antigen (PSA) blood tests as part of routine cancer screening. Talk to your health care provider about prostate cancer screening. Testicular cancer screening is not recommended for adolescents or adult males who have no symptoms. Screening includes self-exam, a health care provider exam, and other screening tests. Consult with your health care provider about any symptoms you have or any concerns you have about testicular cancer. Practice safe sex. Use condoms and avoid high-risk sexual practices to reduce the spread of sexually transmitted infections (STIs). You should be screened for STIs, including gonorrhea and chlamydia if: You are sexually active and are younger than 24 years. You are older than 24 years, and your health care provider tells you that you are at risk for this type of infection. Your sexual activity has changed since you were last screened, and you are at an increased risk for chlamydia or gonorrhea. Ask your health care provider if you are at risk. If you are at risk of being infected with HIV, it is recommended that you take a prescription medicine daily to prevent HIV infection. This is called pre-exposure prophylaxis (PrEP). You are considered at risk if: You are a man who has sex with other men (MSM). You are a heterosexual man who is sexually active with multiple partners. You take drugs by injection. You  are sexually active with a partner who has HIV. Talk with your health care provider about whether you are at high risk of being infected with HIV. If you choose to begin PrEP, you should first be tested for HIV. You should then be tested every 3 months for as long as you are taking PrEP. Use sunscreen. Apply sunscreen liberally and repeatedly throughout the day. You should seek shade when your shadow is shorter than you. Protect yourself by wearing long sleeves, pants, a wide-brimmed hat, and sunglasses year round whenever you are outdoors. Tell your health care provider of new moles or changes in moles, especially if there is a change in shape or color. Also, tell your health care provider if a mole is larger than the size of a pencil eraser. Stay current with your vaccines (immunizations).

## 2016-07-15 LAB — LIPID PANEL
CHOL/HDL RATIO: 2.2 ratio (ref <5.0)
Cholesterol: 167 mg/dL (ref <200)
HDL: 77 mg/dL (ref >40)
LDL CALC: 78 mg/dL (ref <100)
TRIGLYCERIDES: 61 mg/dL (ref <150)
VLDL: 12 mg/dL (ref <30)

## 2016-07-15 LAB — URINALYSIS, ROUTINE W REFLEX MICROSCOPIC
BILIRUBIN URINE: NEGATIVE
Glucose, UA: NEGATIVE
Hgb urine dipstick: NEGATIVE
Ketones, ur: NEGATIVE
LEUKOCYTES UA: NEGATIVE
NITRITE: NEGATIVE
PROTEIN: NEGATIVE
SPECIFIC GRAVITY, URINE: 1.017 (ref 1.001–1.035)
pH: 7 (ref 5.0–8.0)

## 2016-07-15 LAB — HEPATIC FUNCTION PANEL
ALBUMIN: 4.3 g/dL (ref 3.6–5.1)
ALK PHOS: 59 U/L (ref 40–115)
ALT: 15 U/L (ref 9–46)
AST: 19 U/L (ref 10–35)
BILIRUBIN DIRECT: 0.1 mg/dL (ref <=0.2)
BILIRUBIN TOTAL: 0.5 mg/dL (ref 0.2–1.2)
Indirect Bilirubin: 0.4 mg/dL (ref 0.2–1.2)
Total Protein: 7.8 g/dL (ref 6.1–8.1)

## 2016-07-15 LAB — INSULIN, RANDOM: INSULIN: 11 u[IU]/mL (ref 2.0–19.6)

## 2016-07-15 LAB — HEMOGLOBIN A1C
Hgb A1c MFr Bld: 6 % — ABNORMAL HIGH (ref <5.7)
Mean Plasma Glucose: 126 mg/dL

## 2016-07-15 LAB — BASIC METABOLIC PANEL WITH GFR
BUN: 20 mg/dL (ref 7–25)
CHLORIDE: 102 mmol/L (ref 98–110)
CO2: 21 mmol/L (ref 20–31)
Calcium: 10.1 mg/dL (ref 8.6–10.3)
Creat: 1.12 mg/dL (ref 0.70–1.18)
GFR, EST AFRICAN AMERICAN: 74 mL/min (ref >=60)
GFR, Est Non African American: 64 mL/min (ref >=60)
Glucose, Bld: 102 mg/dL — ABNORMAL HIGH (ref 65–99)
POTASSIUM: 4.1 mmol/L (ref 3.5–5.3)
SODIUM: 140 mmol/L (ref 135–146)

## 2016-07-15 LAB — URIC ACID: URIC ACID, SERUM: 5.2 mg/dL (ref 4.0–8.0)

## 2016-07-15 LAB — MICROALBUMIN / CREATININE URINE RATIO
Creatinine, Urine: 154 mg/dL (ref 20–370)
MICROALB/CREAT RATIO: 4 ug/mg{creat} (ref <30)
Microalb, Ur: 0.6 mg/dL

## 2016-07-15 LAB — MAGNESIUM: MAGNESIUM: 1.7 mg/dL (ref 1.5–2.5)

## 2016-07-15 LAB — PSA: PSA: 0.3 ng/mL (ref <=4.0)

## 2016-07-15 LAB — TSH: TSH: 1.08 m[IU]/L (ref 0.40–4.50)

## 2016-07-15 LAB — VITAMIN D 25 HYDROXY (VIT D DEFICIENCY, FRACTURES): VIT D 25 HYDROXY: 91 ng/mL (ref 30–100)

## 2016-08-16 ENCOUNTER — Other Ambulatory Visit (INDEPENDENT_AMBULATORY_CARE_PROVIDER_SITE_OTHER): Payer: Medicare Other | Admitting: *Deleted

## 2016-08-16 DIAGNOSIS — Z1212 Encounter for screening for malignant neoplasm of rectum: Secondary | ICD-10-CM

## 2016-08-16 LAB — POC HEMOCCULT BLD/STL (HOME/3-CARD/SCREEN)
Card #2 Fecal Occult Blod, POC: NEGATIVE
Card #3 Fecal Occult Blood, POC: NEGATIVE
Fecal Occult Blood, POC: NEGATIVE

## 2016-09-11 ENCOUNTER — Other Ambulatory Visit: Payer: Self-pay | Admitting: Internal Medicine

## 2016-10-19 ENCOUNTER — Encounter: Payer: Self-pay | Admitting: Internal Medicine

## 2016-10-19 ENCOUNTER — Ambulatory Visit (INDEPENDENT_AMBULATORY_CARE_PROVIDER_SITE_OTHER): Payer: Medicare Other | Admitting: Internal Medicine

## 2016-10-19 VITALS — BP 136/80 | HR 72 | Temp 98.2°F | Resp 18 | Ht 74.0 in | Wt 290.0 lb

## 2016-10-19 DIAGNOSIS — Z0001 Encounter for general adult medical examination with abnormal findings: Secondary | ICD-10-CM | POA: Diagnosis not present

## 2016-10-19 DIAGNOSIS — R6889 Other general symptoms and signs: Secondary | ICD-10-CM | POA: Diagnosis not present

## 2016-10-19 DIAGNOSIS — Z0000 Encounter for general adult medical examination without abnormal findings: Secondary | ICD-10-CM

## 2016-10-19 DIAGNOSIS — E119 Type 2 diabetes mellitus without complications: Secondary | ICD-10-CM | POA: Diagnosis not present

## 2016-10-19 DIAGNOSIS — E559 Vitamin D deficiency, unspecified: Secondary | ICD-10-CM | POA: Diagnosis not present

## 2016-10-19 DIAGNOSIS — I10 Essential (primary) hypertension: Secondary | ICD-10-CM

## 2016-10-19 DIAGNOSIS — M1000 Idiopathic gout, unspecified site: Secondary | ICD-10-CM

## 2016-10-19 DIAGNOSIS — Z860100 Personal history of colon polyps, unspecified: Secondary | ICD-10-CM

## 2016-10-19 DIAGNOSIS — E782 Mixed hyperlipidemia: Secondary | ICD-10-CM | POA: Diagnosis not present

## 2016-10-19 DIAGNOSIS — Z8546 Personal history of malignant neoplasm of prostate: Secondary | ICD-10-CM | POA: Diagnosis not present

## 2016-10-19 DIAGNOSIS — Z79899 Other long term (current) drug therapy: Secondary | ICD-10-CM

## 2016-10-19 LAB — TSH: TSH: 1.13 m[IU]/L (ref 0.40–4.50)

## 2016-10-19 LAB — CBC WITH DIFFERENTIAL/PLATELET
Basophils Absolute: 53 cells/uL (ref 0–200)
Basophils Relative: 1 %
Eosinophils Absolute: 106 cells/uL (ref 15–500)
Eosinophils Relative: 2 %
HEMATOCRIT: 39.9 % (ref 38.5–50.0)
HEMOGLOBIN: 12.5 g/dL — AB (ref 13.2–17.1)
LYMPHS PCT: 19 %
Lymphs Abs: 1007 cells/uL (ref 850–3900)
MCH: 25.9 pg — AB (ref 27.0–33.0)
MCHC: 31.3 g/dL — AB (ref 32.0–36.0)
MCV: 82.6 fL (ref 80.0–100.0)
MONO ABS: 424 {cells}/uL (ref 200–950)
MPV: 10.4 fL (ref 7.5–12.5)
Monocytes Relative: 8 %
NEUTROS PCT: 70 %
Neutro Abs: 3710 cells/uL (ref 1500–7800)
Platelets: 250 10*3/uL (ref 140–400)
RBC: 4.83 MIL/uL (ref 4.20–5.80)
RDW: 15.5 % — ABNORMAL HIGH (ref 11.0–15.0)
WBC: 5.3 10*3/uL (ref 3.8–10.8)

## 2016-10-19 LAB — BASIC METABOLIC PANEL WITH GFR
BUN: 16 mg/dL (ref 7–25)
CALCIUM: 10 mg/dL (ref 8.6–10.3)
CO2: 25 mmol/L (ref 20–31)
Chloride: 102 mmol/L (ref 98–110)
Creat: 1.17 mg/dL (ref 0.70–1.18)
GFR, EST AFRICAN AMERICAN: 70 mL/min (ref >=60)
GFR, Est Non African American: 61 mL/min (ref >=60)
Glucose, Bld: 96 mg/dL (ref 65–99)
POTASSIUM: 4.1 mmol/L (ref 3.5–5.3)
SODIUM: 138 mmol/L (ref 135–146)

## 2016-10-19 LAB — LIPID PANEL
CHOL/HDL RATIO: 2 ratio (ref <5.0)
CHOLESTEROL: 163 mg/dL (ref <200)
HDL: 81 mg/dL (ref >40)
LDL Cholesterol: 72 mg/dL (ref <100)
Triglycerides: 51 mg/dL (ref <150)
VLDL: 10 mg/dL (ref <30)

## 2016-10-19 LAB — HEPATIC FUNCTION PANEL
ALBUMIN: 4 g/dL (ref 3.6–5.1)
ALT: 12 U/L (ref 9–46)
AST: 16 U/L (ref 10–35)
Alkaline Phosphatase: 53 U/L (ref 40–115)
BILIRUBIN DIRECT: 0.1 mg/dL (ref <=0.2)
Indirect Bilirubin: 0.4 mg/dL (ref 0.2–1.2)
TOTAL PROTEIN: 7.3 g/dL (ref 6.1–8.1)
Total Bilirubin: 0.5 mg/dL (ref 0.2–1.2)

## 2016-10-19 NOTE — Progress Notes (Signed)
MEDICARE ANNUAL WELLNESS VISIT AND FOLLOW UP Assessment:    1. Essential hypertension -well controlled -dash diet -exercise as tolerated -cont medications -monitor at home - TSH  2. Diabetes mellitus without complication (HCC) -cont checking cbgs -cont metformin -dose adjust if necessary -gentle reminder to continue to exercise - Hemoglobin A1c  3. Idiopathic gout, unspecified chronicity, unspecified site -cont allopurinol -diet compliance  4. H/O prostate cancer (2006) -followed by urology  5. Mixed hyperlipidemia -cont diet and exercise -LDL close to goal - Lipid panel  6. Morbid obesity (BMI 37.89) -cont diet and exercise  7. Medication management  - CBC with Differential/Platelet - Hepatic function panel - BASIC METABOLIC PANEL WITH GFR  8. Vitamin D deficiency -cont Vit D  9. History of colonic polyps -followed by GI on colonoscopy which is up to date    Over 30 minutes of exam, counseling, chart review, and critical decision making was performed  Future Appointments Date Time Provider [place]  01/26/2017 11:30 AM [name], MD [facility] None  08/16/2017 9:00 AM [name], MD [facility] None     Plan:   During the course of the visit the patient was educated and counseled about appropriate screening and preventive services including:    Pneumococcal vaccine   Influenza vaccine  Prevnar 13  Td vaccine  Screening electrocardiogram  Colorectal cancer screening  Diabetes screening  Glaucoma screening  Nutrition counseling    Subjective:  Marcus Chambers is a 76 y.o. male who presents for Medicare Annual Wellness Visit and 3 month follow up for HTN, hyperlipidemia, uncomplicated diabetes, and vitamin D Def.   His blood pressure has been controlled at home, today their BP is BP: 136/80 He does not workout. He denies chest pain, shortness of breath, dizziness.  He is on cholesterol medication and denies myalgias.  His cholesterol is at goal. The cholesterol last visit was:   Lab Results  Component Value Date   CHOL 167 07/14/2016   HDL 77 07/14/2016   LDLCALC 78 07/14/2016   TRIG 61 07/14/2016   CHOLHDL 2.2 07/14/2016   [name] has uncomplicated type 2 diabetes.  He reports that he has recently eaten more candy.  He is taking his metformin.  He  Has not had BS over 200.  He reports he is checking his feet regularly.  He is almost due for his annual eye exam.  He is going to a new doctor in [place] so he won't have to drive so far.  :  Lab Results  Component Value Date   HGBA1C 6.0 (H) 07/14/2016   Last GFR Lab Results  Component Value Date   GFRNONAA 64 07/14/2016     Lab Results  Component Value Date   GFRAA 74 07/14/2016   Patient is on Vitamin D supplement.   Lab Results  Component Value Date   VD25OH 60 07/14/2016     He is still volunteering at the hospital.  He gets a lot of walking in there.    He has no complaints today.    Medication Review: Current Outpatient Prescriptions on File Prior to Visit  Medication Sig Dispense Refill  . allopurinol (ZYLOPRIM) 300 MG tablet TAKE ONE TABLET BY MOUTH ONCE DAILY FOR GOUT 90 tablet 1  . ALPRAZolam (XANAX) 1 MG tablet TAKE HALF TO ONE TABLET BY MOUTH THREE TIMES DAILY AS NEEDED FOR ANXIETY 90 tablet 2  . aspirin (ASPIRIN LOW DOSE) 81 MG tablet Take 81 mg by mouth as needed.      Marland Kitchen  bisoprolol-hydrochlorothiazide (ZIAC) 5-6.25 MG tablet Take 1 tablet by mouth daily. 90 tablet 0  . bumetanide (BUMEX) 2 MG tablet TAKE ONE TABLET BY MOUTH TWICE DAILY 60 tablet 0  . Cholecalciferol (VITAMIN D PO) Take 5,000 Units by mouth. Takes 5000 iu on Mon and Thur and 10000 iu the other 5 days of week    . IRON PO Take 18 mg by mouth daily.    Marland Kitchen lisinopril (PRINIVIL,ZESTRIL) 20 MG tablet Take 1 tablet (20 mg total) by mouth daily. 90 tablet 1  . MAGNESIUM PO Take 500 mg by mouth 2 (two) times daily.     . metFORMIN (GLUCOPHAGE-XR) 500 MG 24 hr tablet  TAKE TWO TABLETS BY MOUTH TWICE DAILY FOR DIABETES 360 tablet 3  . simvastatin (ZOCOR) 40 MG tablet TAKE ONE TABLET BY MOUTH AT BEDTIME FOR CHOLESTEROL 90 tablet 1   No current facility-administered medications on file prior to visit.     Allergies: No Known Allergies  Current Problems (verified) has History of colonic polyps; Diabetes mellitus without complication ([place]); Hyperlipidemia; Gout; Vitamin D deficiency; Essential hypertension; Medication management; H/O prostate cancer (2006); and Morbid obesity (BMI 37.89) on his problem list.  Screening Tests Immunization History  Administered Date(s) Administered  . DT 03/21/2012  . Influenza-Unspecified 04/16/2014, 04/16/2015  . Pneumococcal Conjugate-13 05/19/2014  . Pneumococcal Polysaccharide-23 07/24/2007    Preventative care: Last colonoscopy: 2016  Names of Other Physician/Practitioners you currently use: 1. [facility] here for primary care 2. Dr. Felton Clinton, eye doctor, last visit 2017 3. Does not see , dentist, last visit  Patient Care Team: [name], MD as PCP - General (Internal Medicine) Inda Castle, MD as Consulting Physician (Gastroenterology) Rana Snare, MD as Consulting Physician (Urology) Johnathan Hausen, MD as Consulting Physician (General Surgery)  Surgical: He  has a past surgical history that includes Appendectomy (1994); Cholecystectomy (1994); Knee surgery (1996); and colovesical fistula repair (01/2011). Family His family history is not on file. Social history  He reports that he quit smoking about 31 years ago. He has never used smokeless tobacco. He reports that he does not drink alcohol or use drugs.  MEDICARE WELLNESS OBJECTIVES: Physical activity: Current Exercise Habits: Home exercise routine, Type of exercise: walking, Time (Minutes): 30, Frequency (Times/Week): 7, Weekly Exercise (Minutes/Week): 210, Intensity: Moderate Cardiac risk factors: Cardiac  Risk Factors include: advanced age (>86men, >87 women);diabetes mellitus;hypertension;microalbuminuria;obesity (BMI >30kg/m2);sedentary lifestyle Depression/mood screen:   Depression screen [facility] 2/9 10/19/2016  Decreased Interest 0  Down, Depressed, Hopeless 0  PHQ - 2 Score 0    ADLs:  In your present state of health, do you have any difficulty performing the following activities: 10/19/2016 07/14/2016  Hearing? N N  Vision? N N  Difficulty concentrating or making decisions? N N  Walking or climbing stairs? N N  Dressing or bathing? N N  Doing errands, shopping? N N  Preparing Food and eating ? N -  Using the Toilet? N -  In the past six months, have you accidently leaked urine? N -  Do you have problems with loss of bowel control? N -  Managing your Medications? N -  Managing your Finances? N -  Housekeeping or managing your Housekeeping? N -  Some recent data might be hidden     Cognitive Testing  Alert? Yes  Normal Appearance?Yes  Oriented to person? Yes  Place? Yes   Time? Yes  Recall of three objects?  Yes  Can perform simple calculations? Yes  Displays appropriate judgment?Yes  Can read the correct time from a watch face?Yes  EOL planning:     Objective:   Today's Vitals   10/19/16 1130  BP: 136/80  Pulse: 72  Resp: 18  Temp: 98.2 F (36.8 C)  TempSrc: Temporal  Weight: 290 lb (131.5 kg)  Height: 6\' 2"  (1.88 m)   Body mass index is 37.23 kg/m.  Wt Readings from Last 3 Encounters:  10/19/16 290 lb (131.5 kg)  07/14/16 285 lb 3.2 oz (129.4 kg)  04/12/16 291 lb 12.8 oz (132.4 kg)    General appearance: alert, no distress, WD/WN, male HEENT: normocephalic, sclerae anicteric, TMs pearly, nares patent, no discharge or erythema, pharynx normal Oral cavity: MMM, no lesions Neck: supple, no lymphadenopathy, no thyromegaly, no masses Heart: RRR, normal S1, S2, no murmurs Lungs: CTA bilaterally, no wheezes, rhonchi, or rales Abdomen: +bs, soft, non tender, non  distended, no masses, no hepatomegaly, no splenomegaly Musculoskeletal: nontender, no swelling, no obvious deformity Extremities: no edema, no cyanosis, no clubbing Pulses: 2+ symmetric, upper and lower extremities, normal cap refill Neurological: alert, oriented x 3, CN2-12 intact, strength normal upper extremities and lower extremities, sensation normal throughout, DTRs 2+ throughout, no cerebellar signs, gait normal Psychiatric: normal affect, behavior normal, pleasant   Medicare Attestation I have personally reviewed: The patient's medical and social history Their use of alcohol, tobacco or illicit drugs Their current medications and supplements The patient's functional ability including ADLs,fall risks, home safety risks, cognitive, and hearing and visual impairment Diet and physical activities Evidence for depression or mood disorders  The patient's weight, height, BMI, and visual acuity have been recorded in the chart.  I have made referrals, counseling, and provided education to the patient based on review of the above and I have provided the patient with a written personalized care plan for preventive services.     Starlyn Skeans, PA-C   10/19/2016

## 2016-10-20 LAB — HEMOGLOBIN A1C
Hgb A1c MFr Bld: 5.6 % (ref <5.7)
MEAN PLASMA GLUCOSE: 114 mg/dL

## 2016-11-21 ENCOUNTER — Other Ambulatory Visit: Payer: Self-pay

## 2016-11-21 MED ORDER — LISINOPRIL 20 MG PO TABS: 20.0000 mg | ORAL_TABLET | Freq: Every day | ORAL | 1 refills | Status: DC

## 2016-12-04 ENCOUNTER — Other Ambulatory Visit: Payer: Self-pay | Admitting: Internal Medicine

## 2017-01-04 DIAGNOSIS — H35032 Hypertensive retinopathy, left eye: Secondary | ICD-10-CM | POA: Diagnosis not present

## 2017-01-04 LAB — HM DIABETES EYE EXAM

## 2017-01-14 ENCOUNTER — Other Ambulatory Visit: Payer: Self-pay | Admitting: Internal Medicine

## 2017-01-22 NOTE — Progress Notes (Addendum)
This very nice 76 y.o. DBM presents for 6 month follow up with Hypertension, Hyperlipidemia, T2_DM and Vitamin D Deficiency. Patient has hx/o Gout controlled on meds. Also , has a hx/o Prostate Ca (2008)  followed by Dr Risa Grill.      Patient is treated for HTN (1989)  & BP has been controlled at home. Today's BP is elevated at 140/100 and rechecked at 144/90. In 2008, he had a Nl Ht Cath after a false (+) Cardiolite. Patient has had no complaints of any cardiac type chest pain, palpitations, dyspnea/orthopnea/PND, dizziness, claudication, or dependent edema.     Hyperlipidemia is controlled with diet & meds. Patient denies myalgias or other med SE's. 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  Last A1c was at goal: Lab Results  Component Value Date   HGBA1C 5.6 10/19/2016      Further, the patient also has history of Vitamin D Deficiency ("29" in 2008) and supplements vitamin D without any suspected side-effects. Last vitamin D was at goal: Lab Results  Component Value Date   VD25OH 91 07/14/2016   Current Outpatient Prescriptions on File Prior to Visit  Medication Sig  . allopurinol (ZYLOPRIM) 300 MG tablet TAKE ONE TABLET BY MOUTH ONCE DAILY FOR  GOUT  . ALPRAZolam (XANAX) 1 MG tablet TAKE HALF TO ONE TABLET BY MOUTH THREE TIMES DAILY AS NEEDED FOR ANXIETY  . aspirin (ASPIRIN LOW DOSE) 81 MG tablet Take 81 mg by mouth as needed.    . bumetanide (BUMEX) 2 MG tablet TAKE ONE TABLET BY MOUTH TWICE DAILY  . Cholecalciferol (VITAMIN D PO) Take 5,000 Units by mouth. Takes 5000 iu on Mon and Thur and 10000 iu the other 5 days of week  .  IRON PO Take 18 mg by mouth daily.  Marland Kitchen lisinopril (PRINIVIL,ZESTRIL) 20 MG tablet Take 1 tablet (20 mg total) by mouth daily.  Marland Kitchen MAGNESIUM PO Take 500 mg by mouth 2 (two) times daily.   . metFORMIN (GLUCOPHAGE-XR) 500 MG 24 hr tablet TAKE TWO TABLETS BY MOUTH TWICE DAILY FOR DIABETES  . simvastatin (ZOCOR) 40 MG tablet TAKE ONE TABLET BY MOUTH AT BEDTIME FOR CHOLESTEROL   No current facility-administered medications on file prior to visit.    No Known Allergies PMHx:   Past Medical History:  Diagnosis Date  . Adenomatous colon polyp    2010  . Gout   . Hyperlipidemia   . Hypertension   . Prostate cancer ([place])   . Type II or unspecified type diabetes mellitus without mention of complication, not stated as uncontrolled   . Vitamin D deficiency    Immunization History  Administered Date(s) Administered  . DT 03/21/2012  . Influenza-Unspecified 04/16/2014, 04/16/2015  . Pneumococcal Conjugate-13 05/19/2014  . Pneumococcal Polysaccharide-23 07/24/2007   Past Surgical History:  Procedure Laterality Date  . APPENDECTOMY  1994   and GALLBLADDER  . CHOLECYSTECTOMY  1994  . colovesical fistula repair  01/2011  . KNEE SURGERY  1996   FHx:    Reviewed / unchanged  SHx:    Reviewed /  unchanged  Systems Review:  Constitutional: Denies fever, chills, wt changes, headaches, insomnia, fatigue, night sweats, change in appetite. Eyes: Denies redness, blurred vision, diplopia, discharge, itchy, watery eyes.  ENT: Denies discharge, congestion, post nasal drip, epistaxis, sore throat, earache, hearing loss, dental pain, tinnitus, vertigo, sinus pain, snoring.  CV: Denies chest pain, palpitations, irregular heartbeat, syncope, dyspnea, diaphoresis, orthopnea, PND, claudication or edema. Respiratory: denies cough, dyspnea, DOE, pleurisy, hoarseness, laryngitis, wheezing.  Gastrointestinal: Denies dysphagia, odynophagia, heartburn, reflux, water brash, abdominal pain or cramps, nausea, vomiting,  bloating, diarrhea, constipation, hematemesis, melena, hematochezia  or hemorrhoids. Genitourinary: Denies dysuria, frequency, urgency, nocturia, hesitancy, discharge, hematuria or flank pain. Musculoskeletal: Denies arthralgias, myalgias, stiffness, jt. swelling, pain, limping or strain/sprain.  Skin: Denies pruritus, rash, hives, warts, acne, eczema or change in skin lesion(s). Neuro: No weakness, tremor, incoordination, spasms, paresthesia or pain. Psychiatric: Denies confusion, memory loss or sensory loss. Endo: Denies change in weight, skin or hair change.  Heme/Lymph: No excessive bleeding, bruising or enlarged lymph nodes.  Physical Exam  BP (!) 140/100   Pulse 84   Temp (!) 97.1 F (36.2 C)   Resp 16   Ht 6\' 2"  (1.88 m)   Wt 284 lb (128.8 kg)   BMI 36.46 kg/m    During taking the vial signs , the nurse noted a rapid  heart rate , "too fast to count" and a Stat EKG was done & showed only NSR with occasional fusion bear and no salvo's.   Appears well nourished, well groomed  and in no distress.  Eyes: PERRLA, EOMs, conjunctiva no swelling or erythema. Sinuses: No frontal/maxillary tenderness ENT/Mouth: EAC's clear, TM's nl w/o erythema, bulging. Nares clear w/o erythema, swelling, exudates. Oropharynx clear without erythema or exudates. Oral hygiene is good. Tongue normal, non obstructing. Hearing intact.  Neck: Supple. Thyroid nl. Car 2+/2+ without bruits, nodes or JVD. Chest: Respirations nl with BS clear & equal w/o rales, rhonchi, wheezing or stridor.  Cor: Heart sounds normal w/ regular rate and rhythm without sig. murmurs, gallops, clicks or rubs. Peripheral pulses normal and equal  without edema.  Abdomen: Soft & bowel sounds normal. Non-tender w/o guarding, rebound, hernias, masses or organomegaly.  Lymphatics: Unremarkable.  Musculoskeletal: Full ROM all peripheral extremities, joint stability, 5/5 strength and normal gait.  Skin: Warm, dry without exposed rashes,  lesions or ecchymosis apparent.  Neuro: Cranial nerves intact, reflexes equal bilaterally. Sensory-motor testing grossly intact. Tendon reflexes grossly intact.  Pysch: Alert & oriented x 3.  Insight and judgement nl & appropriate. No ideations.  Assessment and Plan:  1. Essential hypertension  - Continue medication, monitor blood pressure at home.  - Continue DASH diet. Reminder to go to the ER if any CP,  SOB, nausea, dizziness, severe HA, changes vision/speech.  - CBC with Differential/Platelet - BASIC METABOLIC PANEL WITH GFR - Magnesium - TSH  2. Hyperlipidemia, mixed  - Continue diet/meds, exercise,& lifestyle modifications.  - Continue monitor periodic cholesterol/liver & renal functions  - Hepatic function panel - Lipid panel - TSH  3. Diabetes mellitus without complication ([place])  - Continue diet, exercise, lifestyle modifications.  - Monitor appropriate labs.  - Hemoglobin A1c - Insulin, random  4. Vitamin D deficiency  - Continue supplementation.  - VITAMIN D 25 Hydroxy   5. Idiopathic gout  - Uric acid  6. Medication management  - CBC with Differential/Platelet - BASIC METABOLIC PANEL WITH GFR - Hepatic function panel - Magnesium - Lipid panel - TSH - Hemoglobin A1c -  Insulin, random - VITAMIN D 25 Hydroxy        Discussed  regular exercise, BP monitoring, weight control to achieve/maintain BMI less than 25 and discussed med and SE's. Recommended labs to assess and monitor clinical status with further disposition pending results of labs. Over 30 minutes of exam, counseling, chart review was performed.

## 2017-01-22 NOTE — Patient Instructions (Signed)

## 2017-01-23 ENCOUNTER — Encounter: Payer: Self-pay | Admitting: Internal Medicine

## 2017-01-23 ENCOUNTER — Ambulatory Visit (INDEPENDENT_AMBULATORY_CARE_PROVIDER_SITE_OTHER): Payer: Medicare Other | Admitting: Internal Medicine

## 2017-01-23 VITALS — BP 140/100 | HR 84 | Temp 97.1°F | Resp 16 | Ht 74.0 in | Wt 284.0 lb

## 2017-01-23 DIAGNOSIS — E559 Vitamin D deficiency, unspecified: Secondary | ICD-10-CM | POA: Diagnosis not present

## 2017-01-23 DIAGNOSIS — I10 Essential (primary) hypertension: Secondary | ICD-10-CM | POA: Diagnosis not present

## 2017-01-23 DIAGNOSIS — E782 Mixed hyperlipidemia: Secondary | ICD-10-CM | POA: Diagnosis not present

## 2017-01-23 DIAGNOSIS — Z79899 Other long term (current) drug therapy: Secondary | ICD-10-CM

## 2017-01-23 DIAGNOSIS — M1000 Idiopathic gout, unspecified site: Secondary | ICD-10-CM | POA: Diagnosis not present

## 2017-01-23 DIAGNOSIS — E119 Type 2 diabetes mellitus without complications: Secondary | ICD-10-CM | POA: Diagnosis not present

## 2017-01-23 DIAGNOSIS — I4949 Other premature depolarization: Secondary | ICD-10-CM | POA: Diagnosis not present

## 2017-01-23 MED ORDER — BISOPROLOL-HYDROCHLOROTHIAZIDE 5-6.25 MG PO TABS: ORAL_TABLET | ORAL | 1 refills | Status: DC

## 2017-01-24 LAB — CBC WITH DIFFERENTIAL/PLATELET
BASOS ABS: 63 {cells}/uL (ref 0–200)
Basophils Relative: 1 %
EOS ABS: 189 {cells}/uL (ref 15–500)
Eosinophils Relative: 3 %
HCT: 39.2 % (ref 38.5–50.0)
HEMOGLOBIN: 12.5 g/dL — AB (ref 13.2–17.1)
LYMPHS ABS: 1512 {cells}/uL (ref 850–3900)
Lymphocytes Relative: 24 %
MCH: 26.3 pg — AB (ref 27.0–33.0)
MCHC: 31.9 g/dL — ABNORMAL LOW (ref 32.0–36.0)
MCV: 82.4 fL (ref 80.0–100.0)
MONO ABS: 378 {cells}/uL (ref 200–950)
MONOS PCT: 6 %
MPV: 9.9 fL (ref 7.5–12.5)
NEUTROS ABS: 4158 {cells}/uL (ref 1500–7800)
Neutrophils Relative %: 66 %
Platelets: 242 10*3/uL (ref 140–400)
RBC: 4.76 MIL/uL (ref 4.20–5.80)
RDW: 15.6 % — ABNORMAL HIGH (ref 11.0–15.0)
WBC: 6.3 10*3/uL (ref 3.8–10.8)

## 2017-01-24 LAB — HEMOGLOBIN A1C
Hgb A1c MFr Bld: 6 % — ABNORMAL HIGH (ref <5.7)
Mean Plasma Glucose: 126 mg/dL

## 2017-01-24 LAB — HEPATIC FUNCTION PANEL
ALT: 13 U/L (ref 9–46)
AST: 17 U/L (ref 10–35)
Albumin: 4.2 g/dL (ref 3.6–5.1)
Alkaline Phosphatase: 61 U/L (ref 40–115)
Bilirubin, Direct: 0.1 mg/dL (ref <=0.2)
Indirect Bilirubin: 0.4 mg/dL (ref 0.2–1.2)
TOTAL PROTEIN: 7.6 g/dL (ref 6.1–8.1)
Total Bilirubin: 0.5 mg/dL (ref 0.2–1.2)

## 2017-01-24 LAB — BASIC METABOLIC PANEL WITH GFR
BUN: 20 mg/dL (ref 7–25)
CHLORIDE: 101 mmol/L (ref 98–110)
CO2: 21 mmol/L (ref 20–31)
CREATININE: 1.16 mg/dL (ref 0.70–1.18)
Calcium: 10.4 mg/dL — ABNORMAL HIGH (ref 8.6–10.3)
GFR, Est African American: 71 mL/min (ref >=60)
GFR, Est Non African American: 61 mL/min (ref >=60)
Glucose, Bld: 84 mg/dL (ref 65–99)
Potassium: 4.1 mmol/L (ref 3.5–5.3)
Sodium: 139 mmol/L (ref 135–146)

## 2017-01-24 LAB — TSH: TSH: 0.88 mIU/L (ref 0.40–4.50)

## 2017-01-24 LAB — LIPID PANEL
CHOLESTEROL: 166 mg/dL (ref <200)
HDL: 83 mg/dL (ref >40)
LDL Cholesterol: 71 mg/dL (ref <100)
TRIGLYCERIDES: 62 mg/dL (ref <150)
Total CHOL/HDL Ratio: 2 Ratio (ref <5.0)
VLDL: 12 mg/dL (ref <30)

## 2017-01-24 LAB — VITAMIN D 25 HYDROXY (VIT D DEFICIENCY, FRACTURES): VIT D 25 HYDROXY: 82 ng/mL (ref 30–100)

## 2017-01-24 LAB — INSULIN, RANDOM: INSULIN: 6.6 u[IU]/mL (ref 2.0–19.6)

## 2017-01-24 LAB — MAGNESIUM: Magnesium: 1.6 mg/dL (ref 1.5–2.5)

## 2017-01-24 LAB — URIC ACID: URIC ACID, SERUM: 6 mg/dL (ref 4.0–8.0)

## 2017-01-24 NOTE — Addendum Note (Signed)
Addended by: [name] on: 01/24/2017 07:16 PM   Modules accepted: Orders

## 2017-01-26 ENCOUNTER — Ambulatory Visit: Payer: Self-pay | Admitting: Internal Medicine

## 2017-02-04 ENCOUNTER — Other Ambulatory Visit: Payer: Self-pay | Admitting: Internal Medicine

## 2017-02-06 ENCOUNTER — Encounter: Payer: Self-pay | Admitting: Internal Medicine

## 2017-03-31 ENCOUNTER — Other Ambulatory Visit: Payer: Self-pay | Admitting: Internal Medicine

## 2017-03-31 DIAGNOSIS — F411 Generalized anxiety disorder: Secondary | ICD-10-CM

## 2017-03-31 NOTE — Telephone Encounter (Signed)
please call Alpraz 

## 2017-04-02 ENCOUNTER — Other Ambulatory Visit: Payer: Self-pay | Admitting: Internal Medicine

## 2017-04-02 DIAGNOSIS — F411 Generalized anxiety disorder: Secondary | ICD-10-CM

## 2017-04-02 NOTE — Telephone Encounter (Signed)
Please call Alpraz  

## 2017-04-17 ENCOUNTER — Other Ambulatory Visit: Payer: Self-pay | Admitting: *Deleted

## 2017-04-17 MED ORDER — ATENOLOL 50 MG PO TABS: 50.0000 mg | ORAL_TABLET | Freq: Every day | ORAL | 1 refills | Status: DC

## 2017-05-10 ENCOUNTER — Ambulatory Visit (INDEPENDENT_AMBULATORY_CARE_PROVIDER_SITE_OTHER): Payer: Medicare Other | Admitting: Physician Assistant

## 2017-05-10 ENCOUNTER — Encounter: Payer: Self-pay | Admitting: Physician Assistant

## 2017-05-10 VITALS — BP 140/80 | HR 84 | Temp 97.2°F | Resp 16 | Ht 74.0 in | Wt 283.0 lb

## 2017-05-10 DIAGNOSIS — E119 Type 2 diabetes mellitus without complications: Secondary | ICD-10-CM

## 2017-05-10 DIAGNOSIS — H35039 Hypertensive retinopathy, unspecified eye: Secondary | ICD-10-CM | POA: Insufficient documentation

## 2017-05-10 DIAGNOSIS — Z6836 Body mass index (BMI) 36.0-36.9, adult: Secondary | ICD-10-CM | POA: Diagnosis not present

## 2017-05-10 DIAGNOSIS — I10 Essential (primary) hypertension: Secondary | ICD-10-CM | POA: Diagnosis not present

## 2017-05-10 DIAGNOSIS — H35033 Hypertensive retinopathy, bilateral: Secondary | ICD-10-CM | POA: Diagnosis not present

## 2017-05-10 DIAGNOSIS — Z79899 Other long term (current) drug therapy: Secondary | ICD-10-CM

## 2017-05-10 DIAGNOSIS — F411 Generalized anxiety disorder: Secondary | ICD-10-CM

## 2017-05-10 DIAGNOSIS — E782 Mixed hyperlipidemia: Secondary | ICD-10-CM

## 2017-05-10 MED ORDER — ALPRAZOLAM 1 MG PO TABS: ORAL_TABLET | ORAL | 0 refills | Status: DC

## 2017-05-10 NOTE — Progress Notes (Signed)
Assessment and Plan:  Essential hypertension - continue medications, DASH diet, exercise and monitor at home. Call if greater than 130/80.  -     CBC with Differential/Platelet -     BASIC METABOLIC PANEL WITH GFR -     Hepatic function panel -     TSH  Diabetes mellitus without complication ([place]) Discussed general issues about diabetes pathophysiology and management., Educational material distributed., Suggested low cholesterol diet., Encouraged aerobic exercise., Discussed foot care., Reminded to get yearly retinal exam. -     BASIC METABOLIC PANEL WITH GFR -     Hemoglobin A1c January 04 2017 eye exam HTN retinopathy Dr. Radford Pax - off ACE due to allergy  Hyperlipidemia -continue medications, check lipids, decrease fatty foods, increase activity.  -     Lipid panel  Morbid obesity due to excess calories ([place]) - long discussion about weight loss, diet, and exercise Follow up 3 months - goal is down another 10 lbs in 3 months -     Lipid panel -     Hemoglobin A1c  Medication management -     Magnesium   Continue diet and meds as discussed. Further disposition pending results of labs. Discussed med's effects and SE's.    HPI 76 y.o. AA male  presents for 3 month follow up with hypertension, hyperlipidemia, diabetes and vitamin D.  His blood pressure has been controlled at home, today their BP is BP: 140/80.  He does workout. He denies chest pain, shortness of breath, dizziness. He will take 1/2 xanax occ at night for sleep, not every night.   He is on cholesterol medication, zocor 40mg  and denies myalgias. His cholesterol is at goal. The cholesterol was:  01/23/2017: Cholesterol 166; HDL 83; LDL Cholesterol 71; Triglycerides 62  He has been working on diet and exercise for diabetes without complications, has had DM x 2005 but has done a good job with diet/exercise/Metformin 2000mg  total bringing from DM range to preDM range, he is on bASA, he is not on ACE/ARB, and denies  paresthesia  of the feet, polydipsia, polyuria and visual disturbances. Last A1C was: 01/23/2017: Hgb A1c MFr Bld 6.0  Patient is on Vitamin D supplement. 01/23/2017: Vit D, 25-Hydroxy 82  Patient is on allopurinol for gout, states if he takes a whole it causes pain so he has been on a half, and does not report a recent flare.  Lab Results  Component Value Date   LABURIC 6.0 01/23/2017   BMI is Body mass index is 36.34 kg/m., he is working on diet and exercise. Wt Readings from Last 3 Encounters:  05/10/17 283 lb (128.4 kg)  01/23/17 284 lb (128.8 kg)  10/19/16 290 lb (131.5 kg)     Current Medications:  Current Outpatient Prescriptions on File Prior to Visit  Medication Sig Dispense Refill  . allopurinol (ZYLOPRIM) 300 MG tablet TAKE ONE TABLET BY MOUTH ONCE DAILY FOR  GOUT 90 tablet 1  . ALPRAZolam (XANAX) 1 MG tablet TAKE ONE-HALF TO ONE TABLET BY MOUTH THREE TIMES DAILY AS NEEDED FOR ANXIETY 90 tablet 2  . aspirin (ASPIRIN LOW DOSE) 81 MG tablet Take 81 mg by mouth as needed.      Marland Kitchen atenolol (TENORMIN) 50 MG tablet Take 1 tablet (50 mg total) by mouth daily. 30 tablet 1  . bumetanide (BUMEX) 2 MG tablet TAKE ONE TABLET BY MOUTH TWICE DAILY 90 tablet 1  . Cholecalciferol (VITAMIN D PO) Take 5,000 Units by mouth. Takes 5000 iu on  Mon and Thur and 10000 iu the other 5 days of week    . IRON PO Take 18 mg by mouth daily.    Marland Kitchen MAGNESIUM PO Take 500 mg by mouth 2 (two) times daily.     . metFORMIN (GLUCOPHAGE-XR) 500 MG 24 hr tablet TAKE TWO TABLETS BY MOUTH TWICE DAILY FOR DIABETES 360 tablet 3  . simvastatin (ZOCOR) 40 MG tablet TAKE ONE TABLET BY MOUTH AT BEDTIME FOR CHOLESTEROL 90 tablet 1   No current facility-administered medications on file prior to visit.    Medical History:  Past Medical History:  Diagnosis Date  . Adenomatous colon polyp    2010  . Gout   . Hyperlipidemia   . Hypertension   . Prostate cancer ([place])   . Type II or unspecified type diabetes mellitus without mention of  complication, not stated as uncontrolled   . Vitamin D deficiency    Allergies: No Known Allergies   Review of Systems:  Review of Systems  Constitutional: Negative.   HENT: Negative for congestion, ear discharge, ear pain, hearing loss, nosebleeds, sore throat and tinnitus.   Eyes: Negative.   Respiratory: Negative for cough, hemoptysis, sputum production, shortness of breath, wheezing and stridor.   Cardiovascular: Negative.   Gastrointestinal: Negative.   Genitourinary: Negative.        Occ incontinence with bumex  Musculoskeletal: Positive for joint pain. Negative for back pain, falls, myalgias and neck pain.  Skin: Negative.   Neurological: Negative.  Negative for headaches.  Endo/Heme/Allergies: Negative.   Psychiatric/Behavioral: Negative.     Family history- Review and unchanged Social history- Review and unchanged Physical Exam: BP 140/80   Pulse 84   Temp (!) 97.2 F (36.2 C)   Resp 16   Ht 6\' 2"  (1.88 m)   Wt 283 lb (128.4 kg)   SpO2 98%   BMI 36.34 kg/m  Wt Readings from Last 3 Encounters:  05/10/17 283 lb (128.4 kg)  01/23/17 284 lb (128.8 kg)  10/19/16 290 lb (131.5 kg)   General Appearance: Well nourished, in no apparent distress. Eyes: PERRLA, EOMs, conjunctiva no swelling or erythema Sinuses: No Frontal/maxillary tenderness ENT/Mouth: Ext aud canals clear, TMs without erythema, bulging. No erythema, swelling, or exudate on post pharynx.  Tonsils not swollen or erythematous. Hearing normal.  Neck: Supple, thyroid normal.  Respiratory: Respiratory effort normal, BS equal bilaterally without rales, rhonchi, wheezing or stridor.  Cardio: RRR with no MRGs. Brisk peripheral pulses with 2+ edema.  Abdomen: Soft, + BS, obese  Non tender, no guarding, rebound, hernias, masses. Lymphatics: Non tender without lymphadenopathy.  Musculoskeletal: Full ROM, 5/5 strength, Normal gait Skin: Warm, dry without rashes, lesions, ecchymosis.  Neuro: Cranial nerves  intact. No cerebellar symptoms.  Psych: Awake and oriented X 3, normal affect, Insight and Judgment appropriate.    Vicie Mutters, PA-C 11:28 AM [facility]

## 2017-05-10 NOTE — Progress Notes (Signed)
Xanax has been called into pharmacy on 25th Oct 2018 by DD 

## 2017-05-10 NOTE — Patient Instructions (Addendum)
Try not to take xanax night Try the melatonin 5mg -20mg  dissolvable or gummy 30 mins before bed  11 Tips to Follow:  1. No caffeine after 3pm: Avoid beverages with caffeine (soda, tea, energy drinks, etc.) especially after 3pm. 2. Don't go to bed hungry: Have your evening meal at least 3 hrs. before going to sleep. It's fine to have a small bedtime snack such as a glass of milk and a few crackers but don't have a big meal. 3. Have a nightly routine before bed: Plan on "winding down" before you go to sleep. Begin relaxing about 1 hour before you go to bed. Try doing a quiet activity such as listening to calming music, reading a book or meditating. 4. Turn off the TV and ALL electronics including video games, tablets, laptops, etc. 1 hour before sleep, and keep them out of the bedroom. 5. Turn off your cell phone and all notifications (new email and text alerts) or even better, leave your phone outside your room while you sleep. Studies have shown that a part of your brain continues to respond to certain lights and sounds even while you're still asleep. 6. Make your bedroom quiet, dark and cool. If you can't control the noise, try wearing earplugs or using a fan to block out other sounds. 7. Practice relaxation techniques. Try reading a book or meditating or drain your brain by writing a list of what you need to do the next day. 8. Don't nap unless you feel sick: you'll have a better night's sleep. 9. Don't smoke, or quit if you do. Nicotine, alcohol, and marijuana can all keep you awake. Talk to your health care provider if you need help with substance use. 10. Most importantly, wake up at the same time every day (or within 1 hour of your usual wake up time) EVEN on the weekends. A regular wake up time promotes sleep hygiene and prevents sleep problems. 11. Reduce exposure to bright light in the last three hours of the day before going to sleep. Maintaining good sleep hygiene and having good sleep  habits lower your risk of developing sleep problems. Getting better sleep can also improve your concentration and alertness. Try the simple steps in this guide. If you still have trouble getting enough rest, make an appointment with your health care provider.   Monitor your blood pressure at home. Go to the ER if any CP, SOB, nausea, dizziness, severe HA, changes vision/speech  Goal BP:  For patients younger than 60: Goal BP < 140/90. For patients 60 and older: Goal BP < 150/90. For patients with diabetes: Goal BP < 140/90. Your most recent BP: BP: 140/80   Take your medications faithfully as instructed. Maintain a healthy weight. Get at least 150 minutes of aerobic exercise per week. Minimize salt intake. Minimize alcohol intake  DASH Eating Plan DASH stands for "Dietary Approaches to Stop Hypertension." The DASH eating plan is a healthy eating plan that has been shown to reduce high blood pressure (hypertension). Additional health benefits may include reducing the risk of type 2 diabetes mellitus, heart disease, and stroke. The DASH eating plan may also help with weight loss. WHAT DO I NEED TO KNOW ABOUT THE DASH EATING PLAN? For the DASH eating plan, you will follow these general guidelines:  Choose foods with a percent daily value for sodium of less than 5% (as listed on the food label).  Use salt-free seasonings or herbs instead of table salt or sea salt.  Check with  your health care provider or pharmacist before using salt substitutes.  Eat lower-sodium products, often labeled as "lower sodium" or "no salt added."  Eat fresh foods.  Eat more vegetables, fruits, and low-fat dairy products.  Choose whole grains. Look for the word "whole" as the first word in the ingredient list.  Choose fish and skinless chicken or [place] more often than red meat. Limit fish, poultry, and meat to 6 oz (170 g) each day.  Limit sweets, desserts, sugars, and sugary drinks.  Choose  heart-healthy fats.  Limit cheese to 1 oz (28 g) per day.  Eat more home-cooked food and less restaurant, buffet, and fast food.  Limit fried foods.  Cook foods using methods other than frying.  Limit canned vegetables. If you do use them, rinse them well to decrease the sodium.  When eating at a restaurant, ask that your food be prepared with less salt, or no salt if possible. WHAT FOODS CAN I EAT? Seek help from a dietitian for individual calorie needs. Grains Whole grain or whole wheat bread. Brown rice. Whole grain or whole wheat pasta. Quinoa, bulgur, and whole grain cereals. Low-sodium cereals. Corn or whole wheat flour tortillas. Whole grain cornbread. Whole grain crackers. Low-sodium crackers. Vegetables Fresh or frozen vegetables (raw, steamed, roasted, or grilled). Low-sodium or reduced-sodium tomato and vegetable juices. Low-sodium or reduced-sodium tomato sauce and paste. Low-sodium or reduced-sodium canned vegetables.  Fruits All fresh, canned (in natural juice), or frozen fruits. Meat and Other Protein Products Ground beef (85% or leaner), grass-fed beef, or beef trimmed of fat. Skinless chicken or [place]. Ground chicken or [place]. Pork trimmed of fat. All fish and seafood. Eggs. Dried beans, peas, or lentils. Unsalted nuts and seeds. Unsalted canned beans. Dairy Low-fat dairy products, such as skim or 1% milk, 2% or reduced-fat cheeses, low-fat ricotta or cottage cheese, or plain low-fat yogurt. Low-sodium or reduced-sodium cheeses. Fats and Oils Tub margarines without trans fats. Light or reduced-fat mayonnaise and salad dressings (reduced sodium). Avocado. Safflower, olive, or canola oils. Natural peanut or almond butter. Other Unsalted popcorn and pretzels. The items listed above may not be a complete list of recommended foods or beverages. Contact your dietitian for more options. WHAT FOODS ARE NOT RECOMMENDED? Grains White bread. White pasta. White rice. Refined  cornbread. Bagels and croissants. Crackers that contain trans fat. Vegetables Creamed or fried vegetables. Vegetables in a cheese sauce. Regular canned vegetables. Regular canned tomato sauce and paste. Regular tomato and vegetable juices. Fruits Dried fruits. Canned fruit in light or heavy syrup. Fruit juice. Meat and Other Protein Products Fatty cuts of meat. Ribs, chicken wings, bacon, sausage, bologna, salami, chitterlings, fatback, hot dogs, bratwurst, and packaged luncheon meats. Salted nuts and seeds. Canned beans with salt. Dairy Whole or 2% milk, cream, half-and-half, and cream cheese. Whole-fat or sweetened yogurt. Full-fat cheeses or blue cheese. Nondairy creamers and whipped toppings. Processed cheese, cheese spreads, or cheese curds. Condiments Onion and garlic salt, seasoned salt, table salt, and sea salt. Canned and packaged gravies. Worcestershire sauce. Tartar sauce. Barbecue sauce. Teriyaki sauce. Soy sauce, including reduced sodium. Steak sauce. Fish sauce. Oyster sauce. Cocktail sauce. Horseradish. Ketchup and mustard. Meat flavorings and tenderizers. Bouillon cubes. Hot sauce. Tabasco sauce. Marinades. Taco seasonings. Relishes. Fats and Oils Butter, stick margarine, lard, shortening, ghee, and bacon fat. Coconut, palm kernel, or palm oils. Regular salad dressings. Other Pickles and olives. Salted popcorn and pretzels. The items listed above may not be a complete list of foods and beverages  to avoid. Contact your dietitian for more information. WHERE CAN I FIND MORE INFORMATION? National Heart, Lung, and Blood Institute: travelstabloid.com Document Released: 06/22/2011 Document Revised: 11/17/2013 Document Reviewed: 05/07/2013 [facility] Patient Information 2015 [place], [place]. This information is not intended to replace advice given to you by your health care provider. Make sure you discuss any questions you have with your health care  provider.

## 2017-05-11 LAB — HEPATIC FUNCTION PANEL
AG Ratio: 1.3 (calc) (ref 1.0–2.5)
ALBUMIN MSPROF: 4.1 g/dL (ref 3.6–5.1)
ALT: 14 U/L (ref 9–46)
AST: 13 U/L (ref 10–35)
Alkaline phosphatase (APISO): 47 U/L (ref 40–115)
Bilirubin, Direct: 0.1 mg/dL (ref 0.0–0.2)
Globulin: 3.2 g/dL (calc) (ref 1.9–3.7)
Indirect Bilirubin: 0.3 mg/dL (calc) (ref 0.2–1.2)
Total Bilirubin: 0.4 mg/dL (ref 0.2–1.2)
Total Protein: 7.3 g/dL (ref 6.1–8.1)

## 2017-05-11 LAB — CBC WITH DIFFERENTIAL/PLATELET
Basophils Absolute: 38 cells/uL (ref 0–200)
Basophils Relative: 0.7 %
EOS PCT: 1.7 %
Eosinophils Absolute: 92 cells/uL (ref 15–500)
HCT: 36 % — ABNORMAL LOW (ref 38.5–50.0)
Hemoglobin: 11.7 g/dL — ABNORMAL LOW (ref 13.2–17.1)
LYMPHS ABS: 1091 {cells}/uL (ref 850–3900)
MCH: 26.7 pg — ABNORMAL LOW (ref 27.0–33.0)
MCHC: 32.5 g/dL (ref 32.0–36.0)
MCV: 82.2 fL (ref 80.0–100.0)
MONOS PCT: 8.3 %
MPV: 11.7 fL (ref 7.5–12.5)
NEUTROS PCT: 69.1 %
Neutro Abs: 3731 cells/uL (ref 1500–7800)
PLATELETS: 230 10*3/uL (ref 140–400)
RBC: 4.38 10*6/uL (ref 4.20–5.80)
RDW: 14.3 % (ref 11.0–15.0)
TOTAL LYMPHOCYTE: 20.2 %
WBC mixed population: 448 cells/uL (ref 200–950)
WBC: 5.4 10*3/uL (ref 3.8–10.8)

## 2017-05-11 LAB — LIPID PANEL
Cholesterol: 150 mg/dL (ref <200)
HDL: 77 mg/dL (ref >40)
LDL Cholesterol (Calc): 59 mg/dL (calc)
Non-HDL Cholesterol (Calc): 73 mg/dL (calc) (ref <130)
TRIGLYCERIDES: 64 mg/dL (ref <150)
Total CHOL/HDL Ratio: 1.9 (calc) (ref <5.0)

## 2017-05-11 LAB — BASIC METABOLIC PANEL WITH GFR
BUN: 18 mg/dL (ref 7–25)
CALCIUM: 9.8 mg/dL (ref 8.6–10.3)
CHLORIDE: 100 mmol/L (ref 98–110)
CO2: 27 mmol/L (ref 20–32)
Creat: 1.13 mg/dL (ref 0.70–1.18)
GFR, Est African American: 73 mL/min/{1.73_m2} (ref >=60)
GFR, Est Non African American: 63 mL/min/{1.73_m2} (ref >=60)
GLUCOSE: 87 mg/dL (ref 65–99)
POTASSIUM: 4.1 mmol/L (ref 3.5–5.3)
Sodium: 137 mmol/L (ref 135–146)

## 2017-05-11 LAB — MAGNESIUM: MAGNESIUM: 1.7 mg/dL (ref 1.5–2.5)

## 2017-05-11 LAB — HEMOGLOBIN A1C
EAG (MMOL/L): 6.6 (calc)
Hgb A1c MFr Bld: 5.8 % of total Hgb — ABNORMAL HIGH (ref <5.7)
MEAN PLASMA GLUCOSE: 120 (calc)

## 2017-05-11 LAB — TSH: TSH: 0.93 m[IU]/L (ref 0.40–4.50)

## 2017-05-14 NOTE — Progress Notes (Signed)
Pt aware of lab results & voiced understanding of those results.

## 2017-06-01 DIAGNOSIS — N3020 Other chronic cystitis without hematuria: Secondary | ICD-10-CM | POA: Diagnosis not present

## 2017-06-01 DIAGNOSIS — Z8546 Personal history of malignant neoplasm of prostate: Secondary | ICD-10-CM | POA: Diagnosis not present

## 2017-06-19 ENCOUNTER — Other Ambulatory Visit: Payer: Self-pay | Admitting: Internal Medicine

## 2017-08-15 NOTE — Progress Notes (Signed)
[facility]   [name], M.D.     Uvaldo Bristle. Silverio Lay, P.A.-C [name], [place]                [address] [place] [address] Telephone [phone] Telefax [phone] Annual  Screening/Preventative Visit  & Comprehensive Evaluation & Examination     This very nice 77 y.o. DBMpresents for a Screening/Preventative Visit & comprehensive evaluation and management of multiple medical co-morbidities.  Patient has been followed for HTN, T2_NIDDM, Hyperlipidemia and Vitamin D Deficiency. His Gout is quiescent on his meds.     HTN predates since 1989. Patient's BP has been controlled at home.  Today's BP is at goal - 138/80.  Patient had a False (+) Cardiolite w/Negative/Normal  Heart Cath in 2008. Patient denies any cardiac symptoms as chest pain, palpitations, shortness of breath, dizziness or ankle swelling. BP Readings from Last 3 Encounters:  08/16/17 138/80  05/10/17 140/80  01/23/17 (!) 140/100      Patient's hyperlipidemia is controlled with diet and medications. Patient denies myalgias or other medication SE's. Last lipids were at goal: Lab Results  Component Value Date   CHOL 138 08/16/2017   HDL 72 08/16/2017   LDLCALC 71 01/23/2017   TRIG 63 08/16/2017   CHOLHDL 1.9 08/16/2017      Patient has Morbid Obesity (BMI 36+) and T2_NIDDM/CKD2 (GFR 75)  since 2005 and patient denies reactive hypoglycemic symptoms, visual blurring, diabetic polys or paresthesias. Last A1c was near goal: Lab Results  Component Value Date   HGBA1C 6.0 (H) 08/16/2017       Finally, patient has history of Vitamin D Deficiency ("29"/2008) and last vitamin D was at goal: Lab Results  Component Value Date   VD25OH 76 08/16/2017   Current Outpatient Medications on File Prior to Visit  Medication Sig  . allopurinol (ZYLOPRIM) 300 MG tablet TAKE ONE TABLET BY MOUTH ONCE DAILY FOR  GOUT  .  ALPRAZolam (XANAX) 1 MG tablet TAKE ONE-HALF TO ONE TABLET BY MOUTH THREE TIMES DAILY AS NEEDED FOR ANXIETY  . aspirin (ASPIRIN LOW DOSE) 81 MG tablet Take 81 mg by mouth daily.   Marland Kitchen atenolol (TENORMIN) 50 MG tablet TAKE 1 TABLET BY MOUTH ONCE DAILY  . bumetanide (BUMEX) 2 MG tablet TAKE ONE TABLET BY MOUTH TWICE DAILY  . Cholecalciferol (VITAMIN D PO) Take 5,000 Units by mouth. Takes 5000 iu on Mon and Thur and 10000 iu the other 5 days of week  . IRON PO Take 18 mg by mouth daily.  Marland Kitchen MAGNESIUM PO Take 500 mg by mouth 2 (two) times daily.   . metFORMIN (GLUCOPHAGE-XR) 500 MG 24 hr tablet TAKE TWO TABLETS BY MOUTH TWICE DAILY FOR DIABETES  . simvastatin (ZOCOR) 40 MG tablet TAKE ONE TABLET BY MOUTH AT BEDTIME FOR CHOLESTEROL   No current facility-administered medications on file prior to visit.    No Known Allergies   Past Medical History:  Diagnosis Date  . Adenomatous colon polyp    2010  . Gout   . Hyperlipidemia   . Hypertension   . Prostate cancer ([place]) 2010   treated by external beam radiation  . Type II or unspecified type diabetes mellitus without mention of complication, not stated as uncontrolled   . Vitamin D deficiency    Health Maintenance  Topic Date Due  . OPHTHALMOLOGY  EXAM  01/04/2018  . HEMOGLOBIN A1C  02/13/2018  . FOOT EXAM  08/16/2018  . URINE MICROALBUMIN  08/16/2018  . COLONOSCOPY  07/21/2021  . TETANUS/TDAP  03/21/2022  . INFLUENZA VACCINE  Completed  . PNA vac Low Risk Adult  Completed   Immunization History  Administered Date(s) Administered  . DT 03/21/2012  . Influenza-Unspecified 04/16/2014, 04/16/2015  . Pneumococcal Conjugate-13 05/19/2014  . Pneumococcal Polysaccharide-23 07/24/2007   Last Colon - 07/21/2014 - Dr Deatra Ina recc 7 year f/u - due 07/2021  Past Surgical History:  Procedure Laterality Date  . APPENDECTOMY  1994   and GALLBLADDER  . CHOLECYSTECTOMY  1994  . colovesical fistula repair  01/2011  . KNEE SURGERY  1996   Family  History  Problem Relation Age of Onset  . Colon cancer Neg Hx   . Esophageal cancer Neg Hx   . Rectal cancer Neg Hx   . Stomach cancer Neg Hx    Social History   Socioeconomic History  . Marital status: Divorced  Occupational History  . Retired  Tobacco Use  . Smoking status: Former Smoker    Last attempt to quit: 07/17/1985    Years since quitting: 32.1  . Smokeless tobacco: Never Used  Substance and Sexual Activity  . Alcohol use: No    Alcohol/week: 0.0 oz  . Drug use: No    ROS Constitutional: Denies fever, chills, weight loss/gain, headaches, insomnia,  night sweats or change in appetite. Does c/o fatigue. Eyes: Denies redness, blurred vision, diplopia, discharge, itchy or watery eyes.  ENT: Denies discharge, congestion, post nasal drip, epistaxis, sore throat, earache, hearing loss, dental pain, Tinnitus, Vertigo, Sinus pain or snoring.  Cardio: Denies chest pain, palpitations, irregular heartbeat, syncope, dyspnea, diaphoresis, orthopnea, PND, claudication or edema Respiratory: denies cough, dyspnea, DOE, pleurisy, hoarseness, laryngitis or wheezing.  Gastrointestinal: Denies dysphagia, heartburn, reflux, water brash, pain, cramps, nausea, vomiting, bloating, diarrhea, constipation, hematemesis, melena, hematochezia, jaundice or hemorrhoids Genitourinary: Denies dysuria, frequency, urgency, nocturia, hesitancy, discharge, hematuria or flank pain Musculoskeletal: Denies arthralgia, myalgia, stiffness, Jt. Swelling, pain, limp or strain/sprain. Denies Falls. Skin: Denies puritis, rash, hives, warts, acne, eczema or change in skin lesion Neuro: No weakness, tremor, incoordination, spasms, paresthesia or pain Psychiatric: Denies confusion, memory loss or sensory loss. Denies Depression. Endocrine: Denies change in weight, skin, hair change, nocturia, and paresthesia, diabetic polys, visual blurring or hyper / hypo glycemic episodes.  Heme/Lymph: No excessive bleeding, bruising or  enlarged lymph nodes.  Physical Exam  BP 138/80   Pulse (!) 52   Temp (!) 97.5 F (36.4 C)   Resp 16   Ht 6\' 2"  (1.88 m)   Wt 283 lb (128.4 kg)   BMI 36.34 kg/m   General Appearance: Well nourished and well groomed and in no apparent distress.  Eyes: PERRLA, EOMs, conjunctiva no swelling or erythema, normal fundi and vessels. Sinuses: No frontal/maxillary tenderness ENT/Mouth: EACs patent / TMs  nl. Nares clear without erythema, swelling, mucoid exudates. Oral hygiene is good. No erythema, swelling, or exudate. Tongue normal, non-obstructing. Tonsils not swollen or erythematous. Hearing normal.  Neck: Supple, thyroid normal. No bruits, nodes or JVD. Respiratory: Respiratory effort normal.  BS equal and clear bilateral without rales, rhonci, wheezing or stridor. Cardio: Heart sounds are normal with regular rate and rhythm and no murmurs, rubs or gallops. Peripheral pulses are normal and equal bilaterally without edema. No aortic or femoral bruits. Chest: symmetric with normal excursions and percussion.  Abdomen: Soft, with Nl bowel sounds.  Nontender, no guarding, rebound, hernias, masses, or organomegaly.  Lymphatics: Non tender without lymphadenopathy.  Genitourinary: No hernias.Testes nl. DRE - prostate nl for age - smooth & firm w/o nodules. Musculoskeletal: Full ROM all peripheral extremities, joint stability, 5/5 strength, and normal gait. Skin: Warm and dry without rashes, lesions, cyanosis, clubbing or  ecchymosis.  Neuro: Cranial nerves intact, reflexes equal bilaterally. Normal muscle tone, no cerebellar symptoms. Sensation intact.  Pysch: Alert and oriented X 3 with normal affect, insight and judgment appropriate.   Assessment and Plan  1. Essential hypertension  - EKG 12-Lead - [place], RETROPERITNL ABD,  LTD - Urinalysis, Routine w reflex microscopic - Microalbumin / creatinine urine ratio - CBC with Differential/Platelet - BASIC METABOLIC PANEL WITH GFR - Magnesium -  TSH  2. Hyperlipidemia, mixed  - EKG 12-Lead - [place], RETROPERITNL ABD,  LTD - Hepatic function panel - TSH  3. Diabetes mellitus without complication ([place])  - EKG 12-Lead - [place], RETROPERITNL ABD,  LTD - Urinalysis, Routine w reflex microscopic - Microalbumin / creatinine urine ratio - HM DIABETES FOOT EXAM - LOW EXTREMITY NEUR EXAM DOCUM - Lipid panel - Hemoglobin A1c - Insulin, random  4. Vitamin D deficiency  - VITAMIN D 25 Hydroxy  5. Idiopathic gout, unspecified chronicity, unspecified site  - Uric acid  6. H/O prostate cancer (2006)  - PSA  7. Prostate cancer screening  - PSA  8. Bladder neck obstruction  - PSA  9. Screening for colorectal cancer  - POC Hemoccult Bld/Stl  10. Screening for ischemic heart disease  - EKG 12-Lead  11. Former smoker  - EKG 12-Lead - [place], RETROPERITNL ABD,  LTD  12. Screening for AAA (aortic abdominal aneurysm)  - [place], RETROPERITNL ABD,  LTD  13. Medication management  - Urinalysis, Routine w reflex microscopic - Microalbumin / creatinine urine ratio - CBC with Differential/Platelet - BASIC METABOLIC PANEL WITH GFR - Hepatic function panel - Magnesium - Lipid panel - TSH - Hemoglobin A1c - Insulin, random - VITAMIN D 25 Hydroxy  - Uric acid       Patient was counseled in prudent diet, weight control to achieve/maintain BMI less than 25, BP monitoring, regular exercise and medications as discussed.  Discussed med effects and SE's. Routine screening labs and tests as requested with regular follow-up as recommended. Over 40 minutes of exam, counseling, chart review and high complex critical decision making was performed

## 2017-08-15 NOTE — Patient Instructions (Signed)

## 2017-08-16 ENCOUNTER — Ambulatory Visit (INDEPENDENT_AMBULATORY_CARE_PROVIDER_SITE_OTHER): Payer: Medicare Other | Admitting: Internal Medicine

## 2017-08-16 ENCOUNTER — Encounter: Payer: Self-pay | Admitting: Internal Medicine

## 2017-08-16 VITALS — BP 138/80 | HR 52 | Temp 97.5°F | Resp 16 | Ht 74.0 in | Wt 283.0 lb

## 2017-08-16 DIAGNOSIS — M1000 Idiopathic gout, unspecified site: Secondary | ICD-10-CM

## 2017-08-16 DIAGNOSIS — Z1212 Encounter for screening for malignant neoplasm of rectum: Secondary | ICD-10-CM

## 2017-08-16 DIAGNOSIS — Z8249 Family history of ischemic heart disease and other diseases of the circulatory system: Secondary | ICD-10-CM

## 2017-08-16 DIAGNOSIS — N320 Bladder-neck obstruction: Secondary | ICD-10-CM

## 2017-08-16 DIAGNOSIS — E559 Vitamin D deficiency, unspecified: Secondary | ICD-10-CM

## 2017-08-16 DIAGNOSIS — Z87891 Personal history of nicotine dependence: Secondary | ICD-10-CM

## 2017-08-16 DIAGNOSIS — I10 Essential (primary) hypertension: Secondary | ICD-10-CM

## 2017-08-16 DIAGNOSIS — Z1211 Encounter for screening for malignant neoplasm of colon: Secondary | ICD-10-CM

## 2017-08-16 DIAGNOSIS — Z8546 Personal history of malignant neoplasm of prostate: Secondary | ICD-10-CM | POA: Diagnosis not present

## 2017-08-16 DIAGNOSIS — E782 Mixed hyperlipidemia: Secondary | ICD-10-CM

## 2017-08-16 DIAGNOSIS — Z79899 Other long term (current) drug therapy: Secondary | ICD-10-CM

## 2017-08-16 DIAGNOSIS — Z136 Encounter for screening for cardiovascular disorders: Secondary | ICD-10-CM

## 2017-08-16 DIAGNOSIS — E119 Type 2 diabetes mellitus without complications: Secondary | ICD-10-CM | POA: Diagnosis not present

## 2017-08-16 DIAGNOSIS — Z125 Encounter for screening for malignant neoplasm of prostate: Secondary | ICD-10-CM

## 2017-08-17 LAB — HEPATIC FUNCTION PANEL
AG Ratio: 1.3 (calc) (ref 1.0–2.5)
ALT: 21 U/L (ref 9–46)
AST: 17 U/L (ref 10–35)
Albumin: 3.9 g/dL (ref 3.6–5.1)
Alkaline phosphatase (APISO): 50 U/L (ref 40–115)
BILIRUBIN DIRECT: 0.1 mg/dL (ref 0.0–0.2)
GLOBULIN: 3.1 g/dL (ref 1.9–3.7)
Indirect Bilirubin: 0.5 mg/dL (calc) (ref 0.2–1.2)
TOTAL PROTEIN: 7 g/dL (ref 6.1–8.1)
Total Bilirubin: 0.6 mg/dL (ref 0.2–1.2)

## 2017-08-17 LAB — URINALYSIS, ROUTINE W REFLEX MICROSCOPIC
BACTERIA UA: NONE SEEN /HPF
Bilirubin Urine: NEGATIVE
Glucose, UA: NEGATIVE
HGB URINE DIPSTICK: NEGATIVE
Hyaline Cast: NONE SEEN /LPF
KETONES UR: NEGATIVE
LEUKOCYTES UA: NEGATIVE
Nitrite: NEGATIVE
RBC / HPF: NONE SEEN /HPF (ref 0–2)
SQUAMOUS EPITHELIAL / LPF: NONE SEEN /HPF (ref <=5)
Specific Gravity, Urine: 1.022 (ref 1.001–1.03)
WBC UA: NONE SEEN /HPF (ref 0–5)
pH: 6.5 (ref 5.0–8.0)

## 2017-08-17 LAB — CBC WITH DIFFERENTIAL/PLATELET
BASOS PCT: 0.7 %
Basophils Absolute: 41 cells/uL (ref 0–200)
EOS PCT: 1 %
Eosinophils Absolute: 59 cells/uL (ref 15–500)
HCT: 37.4 % — ABNORMAL LOW (ref 38.5–50.0)
Hemoglobin: 12 g/dL — ABNORMAL LOW (ref 13.2–17.1)
Lymphs Abs: 938 cells/uL (ref 850–3900)
MCH: 26.4 pg — ABNORMAL LOW (ref 27.0–33.0)
MCHC: 32.1 g/dL (ref 32.0–36.0)
MCV: 82.4 fL (ref 80.0–100.0)
MPV: 11.4 fL (ref 7.5–12.5)
Monocytes Relative: 6.4 %
NEUTROS PCT: 76 %
Neutro Abs: 4484 cells/uL (ref 1500–7800)
PLATELETS: 195 10*3/uL (ref 140–400)
RBC: 4.54 10*6/uL (ref 4.20–5.80)
RDW: 14.5 % (ref 11.0–15.0)
TOTAL LYMPHOCYTE: 15.9 %
WBC: 5.9 10*3/uL (ref 3.8–10.8)
WBCMIX: 378 {cells}/uL (ref 200–950)

## 2017-08-17 LAB — HEMOGLOBIN A1C
Hgb A1c MFr Bld: 6 % of total Hgb — ABNORMAL HIGH (ref <5.7)
MEAN PLASMA GLUCOSE: 126 (calc)
eAG (mmol/L): 7 (calc)

## 2017-08-17 LAB — BASIC METABOLIC PANEL WITH GFR
BUN: 12 mg/dL (ref 7–25)
CALCIUM: 9.9 mg/dL (ref 8.6–10.3)
CHLORIDE: 105 mmol/L (ref 98–110)
CO2: 25 mmol/L (ref 20–32)
Creat: 0.98 mg/dL (ref 0.70–1.18)
GFR, EST AFRICAN AMERICAN: 86 mL/min/{1.73_m2} (ref >=60)
GFR, Est Non African American: 75 mL/min/{1.73_m2} (ref >=60)
GLUCOSE: 102 mg/dL — AB (ref 65–99)
POTASSIUM: 4.2 mmol/L (ref 3.5–5.3)
Sodium: 138 mmol/L (ref 135–146)

## 2017-08-17 LAB — LIPID PANEL
CHOLESTEROL: 138 mg/dL (ref <200)
HDL: 72 mg/dL (ref >40)
LDL CHOLESTEROL (CALC): 52 mg/dL
Non-HDL Cholesterol (Calc): 66 mg/dL (calc) (ref <130)
TRIGLYCERIDES: 63 mg/dL (ref <150)
Total CHOL/HDL Ratio: 1.9 (calc) (ref <5.0)

## 2017-08-17 LAB — VITAMIN D 25 HYDROXY (VIT D DEFICIENCY, FRACTURES): VIT D 25 HYDROXY: 76 ng/mL (ref 30–100)

## 2017-08-17 LAB — MICROALBUMIN / CREATININE URINE RATIO
CREATININE, URINE: 188 mg/dL (ref 20–320)
MICROALB UR: 12.1 mg/dL
Microalb Creat Ratio: 64 mcg/mg creat — ABNORMAL HIGH (ref <30)

## 2017-08-17 LAB — TSH: TSH: 0.87 m[IU]/L (ref 0.40–4.50)

## 2017-08-17 LAB — PSA: PSA: 0.2 ng/mL (ref <=4.0)

## 2017-08-17 LAB — URIC ACID: Uric Acid, Serum: 5.6 mg/dL (ref 4.0–8.0)

## 2017-08-17 LAB — MAGNESIUM: Magnesium: 1.6 mg/dL (ref 1.5–2.5)

## 2017-08-17 LAB — INSULIN, RANDOM: Insulin: 8.2 u[IU]/mL (ref 2.0–19.6)

## 2017-08-18 ENCOUNTER — Encounter: Payer: Self-pay | Admitting: Internal Medicine

## 2017-08-29 ENCOUNTER — Other Ambulatory Visit: Payer: Self-pay

## 2017-08-29 DIAGNOSIS — Z1212 Encounter for screening for malignant neoplasm of rectum: Principal | ICD-10-CM

## 2017-08-29 DIAGNOSIS — Z1211 Encounter for screening for malignant neoplasm of colon: Secondary | ICD-10-CM

## 2017-08-29 LAB — POC HEMOCCULT BLD/STL (HOME/3-CARD/SCREEN)
FECAL OCCULT BLD: NEGATIVE
FECAL OCCULT BLD: NEGATIVE
Fecal Occult Blood, POC: NEGATIVE

## 2017-08-30 DIAGNOSIS — Z1212 Encounter for screening for malignant neoplasm of rectum: Secondary | ICD-10-CM | POA: Diagnosis not present

## 2017-09-18 ENCOUNTER — Other Ambulatory Visit: Payer: Self-pay | Admitting: Internal Medicine

## 2017-10-01 ENCOUNTER — Other Ambulatory Visit: Payer: Self-pay | Admitting: *Deleted

## 2017-10-01 MED ORDER — SIMVASTATIN 40 MG PO TABS: ORAL_TABLET | ORAL | 0 refills | Status: DC

## 2017-11-13 ENCOUNTER — Other Ambulatory Visit: Payer: Self-pay | Admitting: Internal Medicine

## 2017-11-18 ENCOUNTER — Other Ambulatory Visit: Payer: Self-pay | Admitting: Physician Assistant

## 2017-11-18 DIAGNOSIS — F411 Generalized anxiety disorder: Secondary | ICD-10-CM

## 2017-11-19 ENCOUNTER — Other Ambulatory Visit: Payer: Self-pay | Admitting: Physician Assistant

## 2017-11-19 DIAGNOSIS — F411 Generalized anxiety disorder: Secondary | ICD-10-CM

## 2017-11-25 ENCOUNTER — Other Ambulatory Visit: Payer: Self-pay | Admitting: Physician Assistant

## 2017-11-26 DIAGNOSIS — F419 Anxiety disorder, unspecified: Secondary | ICD-10-CM | POA: Insufficient documentation

## 2017-11-26 DIAGNOSIS — F418 Other specified anxiety disorders: Secondary | ICD-10-CM | POA: Insufficient documentation

## 2017-11-26 DIAGNOSIS — N182 Chronic kidney disease, stage 2 (mild): Secondary | ICD-10-CM

## 2017-11-26 DIAGNOSIS — E1122 Type 2 diabetes mellitus with diabetic chronic kidney disease: Secondary | ICD-10-CM | POA: Insufficient documentation

## 2017-11-26 NOTE — Progress Notes (Signed)
MEDICARE ANNUAL WELLNESS VISIT AND FOLLOW UP Assessment:   Diagnoses and all orders for this visit:  Encounter for Medicare annual wellness exam  Essential hypertension Continue medications - atenolol and bumetanide Monitor blood pressure at home; call if consistently over 130/80 Continue DASH diet.   Reminder to go to the ER if any CP, SOB, nausea, dizziness, severe HA, changes vision/speech, left arm numbness and tingling and jaw pain.  Type 2 diabetes mellitus with stage 2 chronic kidney disease, without long-term current use of insulin [facility]) Education: Reviewed 'ABCs' of diabetes management (respective goals in parentheses):  A1C (<7), blood pressure (<130/80), and cholesterol (LDL <70) Eye Exam yearly and Dental Exam every 6 months. Dietary recommendations Physical Activity recommendations Just had foot exam at last visit - defer  CKD stage 2 due to type 2 diabetes mellitus (HCC) Increase fluids, avoid NSAIDS, monitor sugars, will monitor  Vitamin D deficiency At goal at recent check; continue to recommend supplementation for goal of 70-100 Defer vitamin D level  Morbid obesity (BMI 37.89) Long discussion about weight loss, diet, and exercise Recommended diet heavy in fruits and veggies and low in animal meats, cheeses, and dairy products, appropriate calorie intake He will work on cutting down on sweets Discussed appropriate weight for height  - weight goal of 271 lb at next appointment Follow up at next visit  Medication management CBC, CMP/GFR  Hypertensive retinopathy of both eyes Control BP, continue follow up with ophthalmology  Mixed hyperlipidemia Continue medications; currently at goal Continue low cholesterol diet and exercise.  Check lipid panel.   History of colonic polyps Up to date on colonoscopies  H/O prostate cancer (2006) Continue to monitor  Idiopathic gout, unspecified chronicity, unspecified site Continue allopurinol Diet discussed Check  uric acid as needed  Anxiety Well managed by current regimen; reminded to avoid daily use of benzo to limit tolerance/addiction Stress management techniques discussed, increase water, good sleep hygiene discussed, increase exercise, and increase veggies.     Over 30 minutes of exam, counseling, chart review, and critical decision making was performed  Future Appointments  Date Time Provider [place]  02/28/2018 10:30 AM [name], MD [facility] None  09/02/2018  9:00 AM [name], MD [facility] None     Plan:   During the course of the visit the patient was educated and counseled about appropriate screening and preventive services including:    Pneumococcal vaccine   Influenza vaccine  Prevnar 13  Td vaccine  Screening electrocardiogram  Colorectal cancer screening  Diabetes screening  Glaucoma screening  Nutrition counseling    Subjective:  Marcus Chambers is a 77 y.o. male who presents for Medicare Annual Wellness Visit and 3 month follow up for HTN, hyperlipidemia, T2DM, and vitamin D Def.   he has a diagnosis of anxiety/insomnia and is currently on xanax 0.5-1 mg TID PRN, reports symptoms are well controlled on current regimen. he reports he currently typically takes 0.5 mg at night for sleep if needed, typically takes 3-4 times a week.   BMI is Body mass index is 35.44 kg/m., he has been working on diet and exercise. Wt Readings from Last 3 Encounters:  11/27/17 276 lb (125.2 kg)  08/16/17 283 lb (128.4 kg)  05/10/17 283 lb (128.4 kg)   His blood pressure has been controlled at home (130/75), today their BP is BP: (!) 144/82 He does workout. He denies chest pain, shortness of breath, dizziness.   He is on cholesterol medication (simvastatin 40 mg daily)  and denies myalgias. His cholesterol is at goal. The cholesterol last visit was:   Lab Results  Component Value Date   CHOL 138 08/16/2017   HDL 72 08/16/2017   LDLCALC 52  08/16/2017   TRIG 63 08/16/2017   CHOLHDL 1.9 08/16/2017   He has been working on diet and exercise for T2 diabetes controlled on metformin, and denies foot ulcerations, increased appetite, nausea, paresthesia of the feet, polydipsia, polyuria, visual disturbances, vomiting and weight loss. He checks sugars BID, "hasn't been bad" but cannot recall exact numbers today, estimates 80-90s typically. Last A1C in the office was:  Lab Results  Component Value Date   HGBA1C 6.0 (H) 08/16/2017   Last GFR Lab Results  Component Value Date   GFRNONAA 75 08/16/2017    Patient is on Vitamin D supplement and at goal at recent check:   Lab Results  Component Value Date   VD25OH 76 08/16/2017     Patient is on allopurinol for gout and does not report a recent flare.  Lab Results  Component Value Date   LABURIC 5.6 08/16/2017     Medication Review:  Current Outpatient Medications (Endocrine & Metabolic):  .  metFORMIN (GLUCOPHAGE-XR) 500 MG 24 hr tablet, TAKE 2 TABLETS BY MOUTH TWICE DAILY FOR DIABETES  Current Outpatient Medications (Cardiovascular):  .  atenolol (TENORMIN) 50 MG tablet, TAKE 1 TABLET BY MOUTH ONCE DAILY .  bumetanide (BUMEX) 2 MG tablet, TAKE ONE TABLET BY MOUTH TWICE DAILY .  simvastatin (ZOCOR) 40 MG tablet, TAKE ONE TABLET BY MOUTH AT BEDTIME FOR CHOLESTEROL (Patient taking differently: TAKE ONE/HALF TABLET BY MOUTH AT BEDTIME FOR CHOLESTEROL) .  simvastatin (ZOCOR) 40 MG tablet, TAKE 1 TABLET BY MOUTH AT BEDTIME FOR CHOLESTEROL   Current Outpatient Medications (Analgesics):  .  allopurinol (ZYLOPRIM) 300 MG tablet, TAKE ONE TABLET BY MOUTH ONCE DAILY FOR  GOUT .  aspirin (ASPIRIN LOW DOSE) 81 MG tablet, Take 81 mg by mouth daily.   Current Outpatient Medications (Hematological):  Marland Kitchen  IRON PO, Take 18 mg by mouth daily.  Current Outpatient Medications (Other):  Marland Kitchen  ALPRAZolam (XANAX) 1 MG tablet, Take 1/2 to 1 tablet 2 to 3 x / day ONLY if needed for Anxiety Attacks &  please try to limit to 5 days /week to avoid addiction .  Cholecalciferol (VITAMIN D PO), Take 10,000 Units by mouth.  Marland Kitchen  MAGNESIUM PO, Take 500 mg by mouth 2 (two) times daily.   Allergies: No Known Allergies  Current Problems (verified) has History of colonic polyps; Type 2 diabetes mellitus ([place]); Hyperlipidemia; Gout; Vitamin D deficiency; Essential hypertension; Medication management; H/O prostate cancer (2006); Morbid obesity (BMI 37.89); Hypertensive retinopathy; CKD stage 2 due to type 2 diabetes mellitus ([place]); and Anxiety on their problem list.  Screening Tests Immunization History  Administered Date(s) Administered  . DT 03/21/2012  . Influenza-Unspecified 04/16/2014, 04/16/2015  . Pneumococcal Conjugate-13 05/19/2014  . Pneumococcal Polysaccharide-23 07/24/2007   Preventative care: Last colonoscopy: 2016 due 2023  Prior vaccinations: TD or Tdap: 2013  Influenza: 2018 Pneumococcal: 2009  Prevnar13: 2015 Shingles/Zostavax: declines  Names of Other Physician/Practitioners you currently use: 1. [facility] here for primary care 2. Dr. Rosana Hoes in [place] [facility]), eye doctor, last visit 01/04/2017 - report received and abstracted 3. Does not see , dentist, - looking for dentist right now  Patient Care Team: [name], MD as PCP - General (Internal Medicine) Inda Castle, MD (Inactive) as Consulting  Physician (Gastroenterology) Rana Snare, MD as Consulting Physician (Urology) Johnathan Hausen, MD as Consulting Physician (General Surgery)  Surgical: He  has a past surgical history that includes Appendectomy (1994); Cholecystectomy (1994); Knee surgery (1996); and colovesical fistula repair (01/2011). Family His family history is not on file. Social history  He reports that he quit smoking about 32 years ago. He has never used smokeless tobacco. He reports that he does not drink alcohol or use drugs.  MEDICARE WELLNESS  OBJECTIVES: Physical activity: Current Exercise Habits: Home exercise routine, Type of exercise: walking, Time (Minutes): 60, Frequency (Times/Week): 5, Weekly Exercise (Minutes/Week): 300, Intensity: Mild Cardiac risk factors: Cardiac Risk Factors include: advanced age (>36men, >26 women);diabetes mellitus;dyslipidemia;male gender;hypertension;obesity (BMI >30kg/m2);smoking/ tobacco exposure Depression/mood screen:   Depression screen [facility] 2/9 11/27/2017  Decreased Interest 0  Down, Depressed, Hopeless 0  PHQ - 2 Score 0    ADLs:  In your present state of health, do you have any difficulty performing the following activities: 11/27/2017 08/16/2017  Hearing? N N  Vision? N N  Difficulty concentrating or making decisions? N N  Walking or climbing stairs? N N  Dressing or bathing? N N  Doing errands, shopping? N N  Some recent data might be hidden     Cognitive Testing  Alert? Yes  Normal Appearance?Yes  Oriented to person? Yes  Place? Yes   Time? Yes  Recall of three objects?  Yes  Can perform simple calculations? Yes  Displays appropriate judgment?Yes  Can read the correct time from a watch face?Yes  EOL planning: Does Patient Have a Medical Advance Directive?: Yes Type of Advance Directive: Healthcare Power of Attorney, Living will Does patient want to make changes to medical advance directive?: No - Patient declined Copy of [place] in Chart?: No - copy requested   Objective:   Today's Vitals   11/27/17 1045 11/27/17 1131  BP: (!) 156/80 (!) 144/82  Pulse: (!) 59   Temp: (!) 97.3 F (36.3 C)   SpO2: 98%   Weight: 276 lb (125.2 kg)   Height: 6\' 2"  (1.88 m)    Body mass index is 35.44 kg/m.  General appearance: alert, no distress, WD/WN, male HEENT: normocephalic, sclerae anicteric, TMs pearly, nares patent, no discharge or erythema, pharynx normal Oral cavity: MMM, no lesions Neck: supple, no lymphadenopathy, no thyromegaly, no masses Heart: RRR,  normal S1, S2, no murmurs Lungs: CTA bilaterally, no wheezes, rhonchi, or rales Abdomen: +bs, soft, non tender, non distended, no masses, no hepatomegaly, no splenomegaly Musculoskeletal: nontender, no swelling, no obvious deformity Extremities: no edema, no cyanosis, no clubbing Pulses: 2+ symmetric, upper and lower extremities, normal cap refill Neurological: alert, oriented x 3, CN2-12 intact, strength normal upper extremities and lower extremities, sensation normal throughout, DTRs 2+ throughout, no cerebellar signs, gait normal Psychiatric: normal affect, behavior normal, pleasant   Medicare Attestation I have personally reviewed: The patient's medical and social history Their use of alcohol, tobacco or illicit drugs Their current medications and supplements The patient's functional ability including ADLs,fall risks, home safety risks, cognitive, and hearing and visual impairment Diet and physical activities Evidence for depression or mood disorders  The patient's weight, height, BMI, and visual acuity have been recorded in the chart.  I have made referrals, counseling, and provided education to the patient based on review of the above and I have provided the patient with a written personalized care plan for preventive services.     Izora Ribas, NP   11/27/2017

## 2017-11-27 ENCOUNTER — Encounter: Payer: Self-pay | Admitting: Adult Health

## 2017-11-27 ENCOUNTER — Ambulatory Visit (INDEPENDENT_AMBULATORY_CARE_PROVIDER_SITE_OTHER): Payer: Medicare Other | Admitting: Adult Health

## 2017-11-27 VITALS — BP 144/82 | HR 59 | Temp 97.3°F | Ht 74.0 in | Wt 276.0 lb

## 2017-11-27 DIAGNOSIS — N182 Chronic kidney disease, stage 2 (mild): Secondary | ICD-10-CM | POA: Diagnosis not present

## 2017-11-27 DIAGNOSIS — R6889 Other general symptoms and signs: Secondary | ICD-10-CM | POA: Diagnosis not present

## 2017-11-27 DIAGNOSIS — E1122 Type 2 diabetes mellitus with diabetic chronic kidney disease: Secondary | ICD-10-CM | POA: Diagnosis not present

## 2017-11-27 DIAGNOSIS — Z79899 Other long term (current) drug therapy: Secondary | ICD-10-CM

## 2017-11-27 DIAGNOSIS — E559 Vitamin D deficiency, unspecified: Secondary | ICD-10-CM

## 2017-11-27 DIAGNOSIS — Z0000 Encounter for general adult medical examination without abnormal findings: Secondary | ICD-10-CM

## 2017-11-27 DIAGNOSIS — M1000 Idiopathic gout, unspecified site: Secondary | ICD-10-CM

## 2017-11-27 DIAGNOSIS — Z8546 Personal history of malignant neoplasm of prostate: Secondary | ICD-10-CM | POA: Diagnosis not present

## 2017-11-27 DIAGNOSIS — Z86010 Personal history of colonic polyps: Secondary | ICD-10-CM

## 2017-11-27 DIAGNOSIS — E782 Mixed hyperlipidemia: Secondary | ICD-10-CM | POA: Diagnosis not present

## 2017-11-27 DIAGNOSIS — H35033 Hypertensive retinopathy, bilateral: Secondary | ICD-10-CM

## 2017-11-27 DIAGNOSIS — Z0001 Encounter for general adult medical examination with abnormal findings: Secondary | ICD-10-CM | POA: Diagnosis not present

## 2017-11-27 DIAGNOSIS — F419 Anxiety disorder, unspecified: Secondary | ICD-10-CM

## 2017-11-27 DIAGNOSIS — I10 Essential (primary) hypertension: Secondary | ICD-10-CM

## 2017-11-27 NOTE — Patient Instructions (Addendum)
Aim for 5 lb weight loss by next visit - 271 lb or less    Aim for 7+ servings of fruits and vegetables daily  80+ fluid ounces of water or unsweet tea for healthy kidneys  Limit alcohol intake  Limit animal fats in diet for cholesterol and heart health - choose grass fed whenever available  Aim for low stress - take time to unwind and care for your mental health  Aim for 150 min of moderate intensity exercise weekly for heart health, and weights twice weekly for bone health  Aim for 7-9 hours of sleep daily        When it comes to diets, agreement about the perfect plan isn't easy to find, even among the experts. Experts at the [employer] developed an idea known as the Healthy Eating Plate. Just imagine a plate divided into logical, healthy portions.  The emphasis is on diet quality:  Load up on vegetables and fruits - one-half of your plate: Aim for color and variety, and remember that potatoes don't count.  Go for whole grains - one-quarter of your plate: Whole wheat, barley, wheat berries, quinoa, oats, brown rice, and foods made with them. If you want pasta, go with whole wheat pasta.  Protein power - one-quarter of your plate: Fish, chicken, beans, and nuts are all healthy, versatile protein sources. Limit red meat.  The diet, however, does go beyond the plate, offering a few other suggestions.  Use healthy plant oils, such as olive, canola, soy, corn, sunflower and peanut. Check the labels, and avoid partially hydrogenated oil, which have unhealthy trans fats.  If you're thirsty, drink water. Coffee and tea are good in moderation, but skip sugary drinks and limit milk and dairy products to one or two daily servings.  The type of carbohydrate in the diet is more important than the amount. Some sources of carbohydrates, such as vegetables, fruits, whole grains, and beans-are healthier than others.  Finally, stay active.

## 2017-11-28 LAB — HEMOGLOBIN A1C
Hgb A1c MFr Bld: 6 % of total Hgb — ABNORMAL HIGH (ref <5.7)
MEAN PLASMA GLUCOSE: 126 (calc)
eAG (mmol/L): 7 (calc)

## 2017-11-28 LAB — CBC WITH DIFFERENTIAL/PLATELET
BASOS PCT: 0.9 %
Basophils Absolute: 50 cells/uL (ref 0–200)
EOS PCT: 0.9 %
Eosinophils Absolute: 50 cells/uL (ref 15–500)
HCT: 37.1 % — ABNORMAL LOW (ref 38.5–50.0)
HEMOGLOBIN: 12.1 g/dL — AB (ref 13.2–17.1)
LYMPHS ABS: 851 {cells}/uL (ref 850–3900)
MCH: 26.7 pg — ABNORMAL LOW (ref 27.0–33.0)
MCHC: 32.6 g/dL (ref 32.0–36.0)
MCV: 81.7 fL (ref 80.0–100.0)
MONOS PCT: 7.7 %
MPV: 11.9 fL (ref 7.5–12.5)
NEUTROS ABS: 4217 {cells}/uL (ref 1500–7800)
Neutrophils Relative %: 75.3 %
PLATELETS: 221 10*3/uL (ref 140–400)
RBC: 4.54 10*6/uL (ref 4.20–5.80)
RDW: 15.2 % — AB (ref 11.0–15.0)
Total Lymphocyte: 15.2 %
WBC mixed population: 431 cells/uL (ref 200–950)
WBC: 5.6 10*3/uL (ref 3.8–10.8)

## 2017-11-28 LAB — COMPLETE METABOLIC PANEL WITH GFR
AG Ratio: 1.4 (calc) (ref 1.0–2.5)
ALKALINE PHOSPHATASE (APISO): 66 U/L (ref 40–115)
ALT: 41 U/L (ref 9–46)
AST: 21 U/L (ref 10–35)
Albumin: 3.9 g/dL (ref 3.6–5.1)
BILIRUBIN TOTAL: 0.7 mg/dL (ref 0.2–1.2)
BUN: 18 mg/dL (ref 7–25)
CHLORIDE: 105 mmol/L (ref 98–110)
CO2: 25 mmol/L (ref 20–32)
Calcium: 10.3 mg/dL (ref 8.6–10.3)
Creat: 1.1 mg/dL (ref 0.70–1.18)
GFR, Est African American: 75 mL/min/{1.73_m2} (ref >=60)
GFR, Est Non African American: 65 mL/min/{1.73_m2} (ref >=60)
GLUCOSE: 94 mg/dL (ref 65–99)
Globulin: 2.8 g/dL (calc) (ref 1.9–3.7)
POTASSIUM: 4.2 mmol/L (ref 3.5–5.3)
Sodium: 139 mmol/L (ref 135–146)
TOTAL PROTEIN: 6.7 g/dL (ref 6.1–8.1)

## 2017-11-28 LAB — LIPID PANEL
CHOLESTEROL: 139 mg/dL (ref <200)
HDL: 59 mg/dL (ref >40)
LDL Cholesterol (Calc): 66 mg/dL (calc)
Non-HDL Cholesterol (Calc): 80 mg/dL (calc) (ref <130)
Total CHOL/HDL Ratio: 2.4 (calc) (ref <5.0)
Triglycerides: 60 mg/dL (ref <150)

## 2017-11-28 LAB — TSH: TSH: 0.93 mIU/L (ref 0.40–4.50)

## 2018-01-10 ENCOUNTER — Other Ambulatory Visit: Payer: Self-pay | Admitting: Internal Medicine

## 2018-01-22 DIAGNOSIS — E11319 Type 2 diabetes mellitus with unspecified diabetic retinopathy without macular edema: Secondary | ICD-10-CM | POA: Diagnosis not present

## 2018-01-22 LAB — HM DIABETES EYE EXAM

## 2018-02-14 ENCOUNTER — Encounter: Payer: Self-pay | Admitting: *Deleted

## 2018-02-25 ENCOUNTER — Other Ambulatory Visit: Payer: Self-pay | Admitting: Internal Medicine

## 2018-02-28 ENCOUNTER — Ambulatory Visit (INDEPENDENT_AMBULATORY_CARE_PROVIDER_SITE_OTHER): Payer: Medicare Other | Admitting: Internal Medicine

## 2018-02-28 VITALS — BP 132/76 | HR 52 | Temp 97.5°F | Resp 16 | Ht 74.0 in | Wt 256.2 lb

## 2018-02-28 DIAGNOSIS — E1122 Type 2 diabetes mellitus with diabetic chronic kidney disease: Secondary | ICD-10-CM | POA: Diagnosis not present

## 2018-02-28 DIAGNOSIS — E559 Vitamin D deficiency, unspecified: Secondary | ICD-10-CM | POA: Diagnosis not present

## 2018-02-28 DIAGNOSIS — Z79899 Other long term (current) drug therapy: Secondary | ICD-10-CM

## 2018-02-28 DIAGNOSIS — E782 Mixed hyperlipidemia: Secondary | ICD-10-CM | POA: Diagnosis not present

## 2018-02-28 DIAGNOSIS — N182 Chronic kidney disease, stage 2 (mild): Secondary | ICD-10-CM

## 2018-02-28 DIAGNOSIS — I10 Essential (primary) hypertension: Secondary | ICD-10-CM

## 2018-02-28 NOTE — Patient Instructions (Signed)

## 2018-02-28 NOTE — Progress Notes (Signed)
This very nice 77 y.o.  DBM presents for 6 month follow up with HTN, HLD, T2_DM and Vitamin D Deficiency. Patient has hx/o Gout controlled on his meds.     Patient is treated for HTN (1989)  & BP has been controlled at home. Today's BP is at goal -  132/76. Patient has had no complaints of any cardiac type chest pain, palpitations, dyspnea / orthopnea / PND, dizziness, claudication, or dependent edema.     Hyperlipidemia is controlled with diet & meds. Patient denies myalgias or other med SE's. Last Lipids were at goal: Lab Results  Component Value Date   CHOL 139 11/27/2017   HDL 59 11/27/2017   LDLCALC 66 11/27/2017   TRIG 60 11/27/2017   CHOLHDL 2.4 11/27/2017      Also, the patient has history of Morbid Obesity (BMI 32+) and T2_NIDDM (2005) w/CKD2 (GFR 75) and has had no symptoms of reactive hypoglycemia, diabetic polys, paresthesias or visual blurring.  Last A1c was at goal: Lab Results  Component Value Date   HGBA1C 6.0 (H) 11/27/2017      Further, the patient also has history of Vitamin D Deficiency ("29"/2008) and supplements vitamin D without any suspected side-effects. Last vitamin D was at goal:  Lab Results  Component Value Date   VD25OH 76 08/16/2017   Current Outpatient Medications on File Prior to Visit  Medication Sig  . allopurinol (ZYLOPRIM) 300 MG tablet TAKE 1 TABLET BY MOUTH ONCE DAILY FOR  GOUT  . ALPRAZolam (XANAX) 1 MG tablet Take 1/2 to 1 tablet 2 to 3 x / day ONLY if needed for Anxiety Attacks & please try to limit to 5 days /week to avoid addiction  . aspirin (ASPIRIN LOW DOSE) 81 MG tablet Take 81 mg by mouth daily.   Marland Kitchen atenolol (TENORMIN) 50 MG tablet TAKE 1 TABLET BY MOUTH ONCE DAILY  . bumetanide (BUMEX) 2 MG tablet TAKE 1 TABLET BY MOUTH TWICE DAILY  . Cholecalciferol (VITAMIN D PO) Take 10,000 Units by mouth.   . IRON PO Take 18 mg by mouth daily.  Marland Kitchen MAGNESIUM PO Take 500 mg by mouth 2 (two) times daily.   . metFORMIN (GLUCOPHAGE-XR) 500 MG 24  hr tablet TAKE 2 TABLETS BY MOUTH TWICE DAILY FOR DIABETES  . simvastatin (ZOCOR) 40 MG tablet TAKE ONE TABLET BY MOUTH AT BEDTIME FOR CHOLESTEROL (Patient taking differently: TAKE ONE/HALF TABLET BY MOUTH AT BEDTIME FOR CHOLESTEROL)  . simvastatin (ZOCOR) 40 MG tablet TAKE 1 TABLET BY MOUTH AT BEDTIME FOR CHOLESTEROL   No current facility-administered medications on file prior to visit.    No Known Allergies PMHx:   Past Medical History:  Diagnosis Date  . Adenomatous colon polyp    2010  . Gout   . Hyperlipidemia   . Hypertension   . Prostate cancer ([place]) 2010   treated by external beam radiation  . Type II or unspecified type diabetes mellitus without mention of complication, not stated as uncontrolled   . Vitamin D deficiency    Immunization History  Administered Date(s) Administered  . DT 03/21/2012  . Influenza-Unspecified 04/16/2014, 04/16/2015  . Pneumococcal Conjugate-13 05/19/2014  . Pneumococcal Polysaccharide-23 07/24/2007   Past Surgical History:  Procedure Laterality Date  . APPENDECTOMY  1994   and GALLBLADDER  . CHOLECYSTECTOMY  1994  . colovesical fistula repair  01/2011  . KNEE SURGERY  1996   FHx:    Reviewed / unchanged  SHx:  Reviewed / unchanged   Systems Review:  Constitutional: Denies fever, chills, wt changes, headaches, insomnia, fatigue, night sweats, change in appetite. Eyes: Denies redness, blurred vision, diplopia, discharge, itchy, watery eyes.  ENT: Denies discharge, congestion, post nasal drip, epistaxis, sore throat, earache, hearing loss, dental pain, tinnitus, vertigo, sinus pain, snoring.  CV: Denies chest pain, palpitations, irregular heartbeat, syncope, dyspnea, diaphoresis, orthopnea, PND, claudication or edema. Respiratory: denies cough, dyspnea, DOE, pleurisy, hoarseness, laryngitis, wheezing.  Gastrointestinal: Denies dysphagia, odynophagia, heartburn, reflux, water brash, abdominal pain or cramps, nausea, vomiting, bloating,  diarrhea, constipation, hematemesis, melena, hematochezia  or hemorrhoids. Genitourinary: Denies dysuria, frequency, urgency, nocturia, hesitancy, discharge, hematuria or flank pain. Musculoskeletal: Denies arthralgias, myalgias, stiffness, jt. swelling, pain, limping or strain/sprain.  Skin: Denies pruritus, rash, hives, warts, acne, eczema or change in skin lesion(s). Neuro: No weakness, tremor, incoordination, spasms, paresthesia or pain. Psychiatric: Denies confusion, memory loss or sensory loss. Endo: Denies change in weight, skin or hair change.  Heme/Lymph: No excessive bleeding, bruising or enlarged lymph nodes.  Physical Exam  BP 132/76   Pulse (!) 52   Temp (!) 97.5 F (36.4 C)   Resp 16   Ht 6\' 2"  (1.88 m)   Wt 256 lb 3.2 oz (116.2 kg)   BMI 32.89 kg/m   Appears  well nourished, well groomed  and in no distress.  Eyes: PERRLA, EOMs, conjunctiva no swelling or erythema. Sinuses: No frontal/maxillary tenderness ENT/Mouth: EAC's clear, TM's nl w/o erythema, bulging. Nares clear w/o erythema, swelling, exudates. Oropharynx clear without erythema or exudates. Oral hygiene is good. Tongue normal, non obstructing. Hearing intact.  Neck: Supple. Thyroid not palpable. Car 2+/2+ without bruits, nodes or JVD. Chest: Respirations nl with BS clear & equal w/o rales, rhonchi, wheezing or stridor.  Cor: Heart sounds normal w/ regular rate and rhythm without sig. murmurs, gallops, clicks or rubs. Peripheral pulses normal and equal  without edema.  Abdomen: Soft & bowel sounds normal. Non-tender w/o guarding, rebound, hernias, masses or organomegaly.  Lymphatics: Unremarkable.  Musculoskeletal: Full ROM all peripheral extremities, joint stability, 5/5 strength and normal gait.  Skin: Warm, dry without exposed rashes, lesions or ecchymosis apparent.  Neuro: Cranial nerves intact, reflexes equal bilaterally. Sensory-motor testing grossly intact. Tendon reflexes grossly intact.  Pysch: Alert  & oriented x 3.  Insight and judgement nl & appropriate. No ideations.  Assessment and Plan:  1. Essential hypertension  - Continue medication, monitor blood pressure at home.  - Continue DASH diet.  Reminder to go to the ER if any CP,  SOB, nausea, dizziness, severe HA, changes vision/speech.  - CBC with Differential/Platelet - COMPLETE METABOLIC PANEL WITH GFR - Magnesium - TSH  2. Hyperlipidemia, mixed  - Continue diet/meds, exercise,& lifestyle modifications.  - Continue monitor periodic cholesterol/liver & renal functions   - Lipid panel - TSH  3. Type 2 diabetes mellitus with stage 2 chronic kidney disease, without long-term current use of insulin (HCC)  - Continue diet, exercise, lifestyle modifications.  - Monitor appropriate labs.  - Hemoglobin A1c - Insulin, random  4. Vitamin D deficiency  - Continue supplementation.  - VITAMIN D 25 Hydroxyl  5. Medication management  - CBC with Differential/Platelet - COMPLETE METABOLIC PANEL WITH GFR - Magnesium - Lipid panel - TSH - Hemoglobin A1c - Insulin, random - VITAMIN D 25 Hydroxyl        Discussed  regular exercise, BP monitoring, weight control to achieve/maintain BMI less than 25 and discussed med and SE's. Recommended labs  to assess and monitor clinical status with further disposition pending results of labs. Over 30 minutes of exam, counseling, chart review was performed.

## 2018-03-01 LAB — TSH: TSH: 0.95 mIU/L (ref 0.40–4.50)

## 2018-03-01 LAB — COMPLETE METABOLIC PANEL WITH GFR
AG Ratio: 1.3 (calc) (ref 1.0–2.5)
ALKALINE PHOSPHATASE (APISO): 57 U/L (ref 40–115)
ALT: 15 U/L (ref 9–46)
AST: 20 U/L (ref 10–35)
Albumin: 4.4 g/dL (ref 3.6–5.1)
BILIRUBIN TOTAL: 0.9 mg/dL (ref 0.2–1.2)
BUN/Creatinine Ratio: 22 (calc) (ref 6–22)
BUN: 31 mg/dL — ABNORMAL HIGH (ref 7–25)
CHLORIDE: 99 mmol/L (ref 98–110)
CO2: 27 mmol/L (ref 20–32)
Calcium: 10.7 mg/dL — ABNORMAL HIGH (ref 8.6–10.3)
Creat: 1.4 mg/dL — ABNORMAL HIGH (ref 0.70–1.18)
GFR, EST AFRICAN AMERICAN: 56 mL/min/{1.73_m2} — AB (ref >=60)
GFR, Est Non African American: 48 mL/min/{1.73_m2} — ABNORMAL LOW (ref >=60)
GLOBULIN: 3.5 g/dL (ref 1.9–3.7)
Glucose, Bld: 88 mg/dL (ref 65–99)
Potassium: 3.7 mmol/L (ref 3.5–5.3)
SODIUM: 139 mmol/L (ref 135–146)
Total Protein: 7.9 g/dL (ref 6.1–8.1)

## 2018-03-01 LAB — LIPID PANEL
Cholesterol: 168 mg/dL (ref <200)
HDL: 65 mg/dL (ref >40)
LDL CHOLESTEROL (CALC): 87 mg/dL
NON-HDL CHOLESTEROL (CALC): 103 mg/dL (ref <130)
Total CHOL/HDL Ratio: 2.6 (calc) (ref <5.0)
Triglycerides: 74 mg/dL (ref <150)

## 2018-03-01 LAB — CBC WITH DIFFERENTIAL/PLATELET
BASOS ABS: 49 {cells}/uL (ref 0–200)
Basophils Relative: 0.9 %
EOS ABS: 70 {cells}/uL (ref 15–500)
Eosinophils Relative: 1.3 %
HEMATOCRIT: 36.9 % — AB (ref 38.5–50.0)
HEMOGLOBIN: 12.3 g/dL — AB (ref 13.2–17.1)
LYMPHS ABS: 1139 {cells}/uL (ref 850–3900)
MCH: 27.4 pg (ref 27.0–33.0)
MCHC: 33.3 g/dL (ref 32.0–36.0)
MCV: 82.2 fL (ref 80.0–100.0)
MPV: 10.9 fL (ref 7.5–12.5)
Monocytes Relative: 8.1 %
Neutro Abs: 3704 cells/uL (ref 1500–7800)
Neutrophils Relative %: 68.6 %
Platelets: 210 10*3/uL (ref 140–400)
RBC: 4.49 10*6/uL (ref 4.20–5.80)
RDW: 15.3 % — ABNORMAL HIGH (ref 11.0–15.0)
Total Lymphocyte: 21.1 %
WBC: 5.4 10*3/uL (ref 3.8–10.8)
WBCMIX: 437 {cells}/uL (ref 200–950)

## 2018-03-01 LAB — HEMOGLOBIN A1C
HEMOGLOBIN A1C: 6.2 %{Hb} — AB (ref <5.7)
MEAN PLASMA GLUCOSE: 131 (calc)
eAG (mmol/L): 7.3 (calc)

## 2018-03-01 LAB — MAGNESIUM: MAGNESIUM: 1.7 mg/dL (ref 1.5–2.5)

## 2018-03-01 LAB — VITAMIN D 25 HYDROXY (VIT D DEFICIENCY, FRACTURES): VIT D 25 HYDROXY: 105 ng/mL — AB (ref 30–100)

## 2018-03-01 LAB — INSULIN, RANDOM: INSULIN: 3.8 u[IU]/mL (ref 2.0–19.6)

## 2018-03-02 ENCOUNTER — Other Ambulatory Visit: Payer: Self-pay | Admitting: Internal Medicine

## 2018-03-02 DIAGNOSIS — N179 Acute kidney failure, unspecified: Secondary | ICD-10-CM

## 2018-03-02 DIAGNOSIS — N289 Disorder of kidney and ureter, unspecified: Secondary | ICD-10-CM

## 2018-03-03 ENCOUNTER — Encounter: Payer: Self-pay | Admitting: Internal Medicine

## 2018-03-04 ENCOUNTER — Telehealth: Payer: Self-pay | Admitting: *Deleted

## 2018-03-04 NOTE — Telephone Encounter (Signed)
Left a message to call me regarding his labs.

## 2018-03-05 ENCOUNTER — Telehealth: Payer: Self-pay | Admitting: *Deleted

## 2018-03-05 NOTE — Telephone Encounter (Signed)
Patient is aware of lab results and scheduled a [place] appointment for labs.

## 2018-03-15 ENCOUNTER — Other Ambulatory Visit: Payer: Self-pay

## 2018-03-15 DIAGNOSIS — N182 Chronic kidney disease, stage 2 (mild): Principal | ICD-10-CM

## 2018-03-15 DIAGNOSIS — E1122 Type 2 diabetes mellitus with diabetic chronic kidney disease: Secondary | ICD-10-CM

## 2018-03-15 MED ORDER — METFORMIN HCL ER 500 MG PO TB24: ORAL_TABLET | ORAL | 3 refills | Status: DC

## 2018-04-02 ENCOUNTER — Ambulatory Visit (INDEPENDENT_AMBULATORY_CARE_PROVIDER_SITE_OTHER): Payer: Medicare Other

## 2018-04-02 DIAGNOSIS — N179 Acute kidney failure, unspecified: Secondary | ICD-10-CM

## 2018-04-02 DIAGNOSIS — N289 Disorder of kidney and ureter, unspecified: Secondary | ICD-10-CM

## 2018-04-02 LAB — BASIC METABOLIC PANEL WITH GFR
BUN/Creatinine Ratio: 18 (calc) (ref 6–22)
BUN: 24 mg/dL (ref 7–25)
CO2: 30 mmol/L (ref 20–32)
CREATININE: 1.32 mg/dL — AB (ref 0.70–1.18)
Calcium: 10.6 mg/dL — ABNORMAL HIGH (ref 8.6–10.3)
Chloride: 99 mmol/L (ref 98–110)
GFR, EST NON AFRICAN AMERICAN: 52 mL/min/{1.73_m2} — AB (ref >=60)
GFR, Est African American: 60 mL/min/{1.73_m2} (ref >=60)
Glucose, Bld: 107 mg/dL — ABNORMAL HIGH (ref 65–99)
POTASSIUM: 4 mmol/L (ref 3.5–5.3)
SODIUM: 137 mmol/L (ref 135–146)

## 2018-04-02 NOTE — Progress Notes (Signed)
Patient presents to the office for a nurse visit to have labs done to check kidney function. Patient was encouraged to consume at least 6 bottles of water a day and states that he only consumes about 4 bottle per day. Stressed the importance of water intake and the importance that it has on his kidney function. Agreed to increase his water consumption.

## 2018-04-03 ENCOUNTER — Encounter: Payer: Self-pay | Admitting: *Deleted

## 2018-04-03 ENCOUNTER — Telehealth: Payer: Self-pay | Admitting: *Deleted

## 2018-04-03 NOTE — Telephone Encounter (Signed)
Copy of labs mailed to the patient 

## 2018-06-03 DIAGNOSIS — Z5309 Procedure and treatment not carried out because of other contraindication: Secondary | ICD-10-CM | POA: Insufficient documentation

## 2018-06-03 NOTE — Progress Notes (Deleted)
Assessment and Plan:  Essential hypertension - continue medications, DASH diet, exercise and monitor at home. Call if greater than 130/80.  -     CBC with Differential/Platelet -     BASIC METABOLIC PANEL WITH GFR -     Hepatic function panel -     TSH  Diabetes mellitus without complication ([place]) Discussed general issues about diabetes pathophysiology and management., Educational material distributed., Suggested low cholesterol diet., Encouraged aerobic exercise., Discussed foot care., Reminded to get yearly retinal exam. -     BASIC METABOLIC PANEL WITH GFR -     Hemoglobin A1c January 04 2017 eye exam HTN retinopathy Dr. Radford Pax - off ACE due to allergy  Hyperlipidemia -continue medications, check lipids, decrease fatty foods, increase activity.  -     Lipid panel  Morbid obesity due to excess calories ([place]) - long discussion about weight loss, diet, and exercise Follow up 3 months - goal is down another 10 lbs in 3 months -     Lipid panel -     Hemoglobin A1c  Medication management -     Magnesium   Continue diet and meds as discussed. Further disposition pending results of labs. Discussed med's effects and SE's.    HPI 77 y.o. AA male  presents for 3 month follow up with hypertension, hyperlipidemia, diabetes and vitamin D.  His blood pressure has been controlled at home, today their BP is  .   He does workout. He denies chest pain, shortness of breath, dizziness. He will take 1/2 xanax occ at night for sleep, not every night.    He is on cholesterol medication, zocor 40mg  and denies myalgias. His cholesterol is at goal. The cholesterol was:  02/28/2018: Cholesterol 168; HDL 65; LDL Cholesterol (Calc) 87; Triglycerides 74    He has been working on diet and exercise for diabetes without complications, has had DM x 2005 but has done a good job with diet/exercise/Metformin 2000mg  total bringing from DM range to preDM range, he is on bASA, he is not on ACE/ARB, and denies   paresthesia of the feet, polydipsia, polyuria and visual disturbances. Last A1C was: 02/28/2018: Hgb A1c MFr Bld 6.2  Patient is on Vitamin D supplement. 02/28/2018: Vit D, 25-Hydroxy 105  Patient is on allopurinol for gout, states if he takes a whole it causes pain so he has been on a half, and does not report a recent flare.  Lab Results  Component Value Date   LABURIC 5.6 08/16/2017   BMI is There is no height or weight on file to calculate BMI., he is working on diet and exercise. Wt Readings from Last 3 Encounters:  02/28/18 256 lb 3.2 oz (116.2 kg)  11/27/17 276 lb (125.2 kg)  08/16/17 283 lb (128.4 kg)     Current Medications:  Current Outpatient Medications on File Prior to Visit  Medication Sig Dispense Refill  . allopurinol (ZYLOPRIM) 300 MG tablet TAKE 1 TABLET BY MOUTH ONCE DAILY FOR  GOUT 90 tablet 1  . ALPRAZolam (XANAX) 1 MG tablet Take 1/2 to 1 tablet 2 to 3 x / day ONLY if needed for Anxiety Attacks & please try to limit to 5 days /week to avoid addiction 90 tablet 0  . aspirin (ASPIRIN LOW DOSE) 81 MG tablet Take 81 mg by mouth daily.     Marland Kitchen atenolol (TENORMIN) 50 MG tablet TAKE 1 TABLET BY MOUTH ONCE DAILY 90 tablet 1  . bumetanide (BUMEX) 2 MG tablet TAKE 1  TABLET BY MOUTH TWICE DAILY 180 tablet 1  . Cholecalciferol (VITAMIN D PO) Take 10,000 Units by mouth.     . IRON PO Take 18 mg by mouth daily.    Marland Kitchen MAGNESIUM PO Take 500 mg by mouth 2 (two) times daily.     . simvastatin (ZOCOR) 40 MG tablet TAKE ONE TABLET BY MOUTH AT BEDTIME FOR CHOLESTEROL (Patient taking differently: TAKE ONE/HALF TABLET BY MOUTH AT BEDTIME FOR CHOLESTEROL) 30 tablet 0  . simvastatin (ZOCOR) 40 MG tablet TAKE 1 TABLET BY MOUTH AT BEDTIME FOR CHOLESTEROL 90 tablet 1   No current facility-administered medications on file prior to visit.    Medical History:  Past Medical History:  Diagnosis Date  . Adenomatous colon polyp    2010  . Gout   . Hyperlipidemia   . Hypertension   . Prostate  cancer ([place]) 2010   treated by external beam radiation  . Type II or unspecified type diabetes mellitus without mention of complication, not stated as uncontrolled   . Vitamin D deficiency    Allergies: No Known Allergies   Review of Systems:  Review of Systems  Constitutional: Negative.   HENT: Negative for congestion, ear discharge, ear pain, hearing loss, nosebleeds, sore throat and tinnitus.   Eyes: Negative.   Respiratory: Negative for cough, hemoptysis, sputum production, shortness of breath, wheezing and stridor.   Cardiovascular: Negative.   Gastrointestinal: Negative.   Genitourinary: Negative.        Occ incontinence with bumex  Musculoskeletal: Positive for joint pain. Negative for back pain, falls, myalgias and neck pain.  Skin: Negative.   Neurological: Negative.  Negative for headaches.  Endo/Heme/Allergies: Negative.   Psychiatric/Behavioral: Negative.     Family history- Review and unchanged Social history- Review and unchanged Physical Exam: There were no vitals taken for this visit. Wt Readings from Last 3 Encounters:  02/28/18 256 lb 3.2 oz (116.2 kg)  11/27/17 276 lb (125.2 kg)  08/16/17 283 lb (128.4 kg)   General Appearance: Well nourished, in no apparent distress. Eyes: PERRLA, EOMs, conjunctiva no swelling or erythema Sinuses: No Frontal/maxillary tenderness ENT/Mouth: Ext aud canals clear, TMs without erythema, bulging. No erythema, swelling, or exudate on post pharynx.  Tonsils not swollen or erythematous. Hearing normal.  Neck: Supple, thyroid normal.  Respiratory: Respiratory effort normal, BS equal bilaterally without rales, rhonchi, wheezing or stridor.  Cardio: RRR with no MRGs. Brisk peripheral pulses with 2+ edema.  Abdomen: Soft, + BS, obese  Non tender, no guarding, rebound, hernias, masses. Lymphatics: Non tender without lymphadenopathy.  Musculoskeletal: Full ROM, 5/5 strength, Normal gait Skin: Warm, dry without rashes, lesions,  ecchymosis.  Neuro: Cranial nerves intact. No cerebellar symptoms.  Psych: Awake and oriented X 3, normal affect, Insight and Judgment appropriate.    Vicie Mutters, PA-C 9:15 AM [facility]

## 2018-06-04 ENCOUNTER — Ambulatory Visit: Payer: Self-pay | Admitting: Physician Assistant

## 2018-06-04 NOTE — Progress Notes (Signed)
Assessment and Plan:  Sinus bradycardia We are going to cut back to 1/2 pill or 25 mg a day of atenolol  We are going to add on norvasc 5 mg at night We are going to stop the simvastatin and start you on a very low dose of lipitor 10mg  a day to reduce interaction possiblity Follow up in 1-2 weeks Monitor your heart rate and BP and bring in the numbers  Call if you have any issues or questions or go to ER  if any chest pain, shortness of breath, nausea, dizziness, severe HA, changes vision/speech  Essential hypertension - continue medications, DASH diet, exercise and monitor at home. Call if greater than 130/80.  -     CBC with Differential/Platelet -     BASIC METABOLIC PANEL WITH GFR -     Hepatic function panel -     TSH  Diabetes mellitus without complication ([place]) Discussed general issues about diabetes pathophysiology and management., Educational material distributed., Suggested low cholesterol diet., Encouraged aerobic exercise., Discussed foot care.,97.8 Reminded to get yearly retinal exam. -     BASIC METABOLIC PANEL WITH GFR -     Hemoglobin A1c - Dr. Danton Sewer. Rosana Hoes May or June this year - off ACE due to allergy  Hyperlipidemia -continue medications, check lipids, decrease fatty foods, increase activity.  -     Lipid panel  Morbid obesity due to excess calories ([place]) - long discussion about weight loss, diet, and exercise Follow up 3 months - goal is down another 10 lbs in 3 months -     Lipid panel -     Hemoglobin A1c  Medication management -     Magnesium   Continue diet and meds as discussed. Further disposition pending results of labs. Discussed med's effects and SE's.   Future Appointments  Date Time Provider [place]  09/02/2018  9:00 AM [name], MD [facility] None  12/02/2018 10:45 AM [name], NP [facility] None    HPI 77 y.o. AA male  presents for 3 month follow up with hypertension, hyperlipidemia, diabetes and vitamin D.   His blood pressure has been controlled at home, today their BP is BP: 140/78. He is on atenolol 50mg  x 6 months, has had fatigue but no SOB, dizziness. HR is 41 with some pauses.    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 He denies chest pain, shortness of breath, dizziness. He will take 1/2 xanax occ at night for sleep, not every night.    He is on cholesterol medication, zocor 40mg  1/2 pill at night and denies myalgias. His cholesterol is at goal. The cholesterol was:  02/28/2018: Cholesterol 168; HDL 65; LDL Cholesterol (Calc) 87; Triglycerides 74    He has been working on diet and exercise for diabetes without complications, has had DM x 2005 but has done a good job with diet/exercise/Metformin 2000mg  total bringing from DM range to preDM range, he is on bASA, he is not on ACE/ARB, and denies  paresthesia of the feet, polydipsia, polyuria and visual disturbances. Last A1C was: 02/28/2018: Hgb A1c MFr Bld 6.2  Patient is on Vitamin D supplement. 02/28/2018: Vit D, 25-Hydroxy 105  Patient is on allopurinol for gout, states if he takes a whole it causes pain so he has been on a half, and does not report a recent flare.  Lab Results  Component Value Date   LABURIC 5.6 08/16/2017   BMI is Body mass index is 33.38 kg/m.,  he is working on diet and exercise. Wt Readings from Last 3 Encounters:  06/05/18 260 lb (117.9 kg)  02/28/18 256 lb 3.2 oz (116.2 kg)  11/27/17 276 lb (125.2 kg)     Current Medications:  Current Outpatient Medications on File Prior to Visit  Medication Sig Dispense Refill  . allopurinol (ZYLOPRIM) 300 MG tablet TAKE 1 TABLET BY MOUTH ONCE DAILY FOR  GOUT 90 tablet 1  . ALPRAZolam (XANAX) 1 MG tablet Take 1/2 to 1 tablet 2 to 3 x / day ONLY if needed for Anxiety Attacks & please try to limit to 5 days /week to avoid addiction 90 tablet 0  . aspirin (ASPIRIN LOW DOSE) 81 MG tablet Take 81 mg by mouth daily.     Marland Kitchen atenolol (TENORMIN) 50 MG tablet TAKE 1 TABLET  BY MOUTH ONCE DAILY 90 tablet 1  . bumetanide (BUMEX) 2 MG tablet TAKE 1 TABLET BY MOUTH TWICE DAILY 180 tablet 1  . Cholecalciferol (VITAMIN D PO) Take 10,000 Units by mouth.     . IRON PO Take 18 mg by mouth daily.    Marland Kitchen MAGNESIUM PO Take 500 mg by mouth 2 (two) times daily.     . simvastatin (ZOCOR) 40 MG tablet TAKE 1 TABLET BY MOUTH AT BEDTIME FOR CHOLESTEROL 90 tablet 1   No current facility-administered medications on file prior to visit.    Medical History:  Past Medical History:  Diagnosis Date  . Adenomatous colon polyp    2010  . Gout   . Hyperlipidemia   . Hypertension   . Prostate cancer ([place]) 2010   treated by external beam radiation  . Type II or unspecified type diabetes mellitus without mention of complication, not stated as uncontrolled   . Vitamin D deficiency    Allergies:  Allergies  Allergen Reactions  . Ace Inhibitors      Review of Systems:  Review of Systems  Constitutional: Negative.   HENT: Negative for congestion, ear discharge, ear pain, hearing loss, nosebleeds, sore throat and tinnitus.   Eyes: Negative.   Respiratory: Negative for cough, hemoptysis, sputum production, shortness of breath, wheezing and stridor.   Cardiovascular: Negative.   Gastrointestinal: Negative.   Genitourinary: Negative.        Occ incontinence with bumex  Musculoskeletal: Positive for joint pain. Negative for back pain, falls, myalgias and neck pain.  Skin: Negative.   Neurological: Negative.  Negative for headaches.  Endo/Heme/Allergies: Negative.   Psychiatric/Behavioral: Negative.     Family history- Review and unchanged Social history- Review and unchanged Physical Exam: BP 140/78   Pulse (!) 41 Comment: manually taken  Temp 97.8 F (36.6 C)   Resp 14   Ht 6\' 2"  (1.88 m)   Wt 260 lb (117.9 kg)   SpO2 98%   BMI 33.38 kg/m  Wt Readings from Last 3 Encounters:  06/05/18 260 lb (117.9 kg)  02/28/18 256 lb 3.2 oz (116.2 kg)  11/27/17 276 lb (125.2 kg)    General Appearance: Well nourished, in no apparent distress. Eyes: PERRLA, EOMs, conjunctiva no swelling or erythema Sinuses: No Frontal/maxillary tenderness ENT/Mouth: Ext aud canals clear, TMs without erythema, bulging. No erythema, swelling, or exudate on post pharynx.  Tonsils not swollen or erythematous. Hearing normal.  Neck: Supple, thyroid normal.  Respiratory: Respiratory effort normal, BS equal bilaterally without rales, rhonchi, wheezing or stridor.  Cardio: RRR with no MRGs. Brisk peripheral pulses with 2+ edema.  Abdomen: Soft, + BS, obese  Non tender,  no guarding, rebound, hernias, masses. Lymphatics: Non tender without lymphadenopathy.  Musculoskeletal: Full ROM, 5/5 strength, Normal gait Skin: Warm, dry without rashes, lesions, ecchymosis.  Neuro: Cranial nerves intact. No cerebellar symptoms.  Psych: Awake and oriented X 3, normal affect, Insight and Judgment appropriate.    Vicie Mutters, PA-C 11:00 AM [facility]

## 2018-06-05 ENCOUNTER — Ambulatory Visit (INDEPENDENT_AMBULATORY_CARE_PROVIDER_SITE_OTHER): Payer: Medicare Other | Admitting: Physician Assistant

## 2018-06-05 VITALS — BP 140/78 | HR 41 | Temp 97.8°F | Resp 14 | Ht 74.0 in | Wt 260.0 lb

## 2018-06-05 DIAGNOSIS — E782 Mixed hyperlipidemia: Secondary | ICD-10-CM | POA: Diagnosis not present

## 2018-06-05 DIAGNOSIS — E1122 Type 2 diabetes mellitus with diabetic chronic kidney disease: Secondary | ICD-10-CM | POA: Diagnosis not present

## 2018-06-05 DIAGNOSIS — N182 Chronic kidney disease, stage 2 (mild): Secondary | ICD-10-CM | POA: Diagnosis not present

## 2018-06-05 DIAGNOSIS — I10 Essential (primary) hypertension: Secondary | ICD-10-CM

## 2018-06-05 DIAGNOSIS — Z79899 Other long term (current) drug therapy: Secondary | ICD-10-CM

## 2018-06-05 DIAGNOSIS — E559 Vitamin D deficiency, unspecified: Secondary | ICD-10-CM

## 2018-06-05 MED ORDER — AMLODIPINE BESYLATE 5 MG PO TABS: 5.0000 mg | ORAL_TABLET | Freq: Every day | ORAL | 11 refills | Status: DC

## 2018-06-05 MED ORDER — ATORVASTATIN CALCIUM 10 MG PO TABS: 10.0000 mg | ORAL_TABLET | Freq: Every day | ORAL | 11 refills | Status: DC

## 2018-06-05 NOTE — Patient Instructions (Addendum)
Your heart rate is too low, this is due to the atenolol  We are going to cut back to 1/2 pill or 25 mg a day  We are going to add on norvasc 5 mg at night We are going to stop the simvastatin and start you on a very low dose of lipitor 10mg  a day Follow up in 1-2 weeks Monitor your heart rate and BP and bring in the numbers  Call if you have any issues or questions or go to ER  if any chest pain, shortness of breath, nausea, dizziness, severe HA, changes vision/speech   Bradycardia, Adult Bradycardia is a slower-than-normal heartbeat. A normal resting heart rate for an adult ranges from 60 to 100 beats per minute. With bradycardia, the resting heart rate is less than 60 beats per minute. Bradycardia can prevent enough oxygen from reaching certain areas of your body when you are active. It can be serious if it keeps enough oxygen from reaching your brain and other parts of your body. Bradycardia is not a problem for everyone. For some healthy adults, a slow resting heart rate is normal. What are the causes? This condition may be caused by:  A problem with the heart, including: ? A problem with the heart's electrical system, such as a heart block. ? A problem with the heart's natural pacemaker (sinus node). ? Heart disease. ? A heart attack. ? Heart damage. ? A heart infection. ? A heart condition that is present at birth (congenital heart defect).  Certain medicines that treat heart conditions.  Certain conditions, such as hypothyroidism and obstructive sleep apnea.  Problems with the balance of chemicals and other substances, like potassium, in the blood.  What increases the risk? This condition is more likely to develop in adults who:  Are age 71 or older.  Have high blood pressure (hypertension), high cholesterol (hyperlipidemia), or diabetes.  Drink heavily, use tobacco or nicotine products, or use drugs.  Are stressed.  What are the signs or symptoms? Symptoms of  this condition include:  Light-headedness.  Feeling faint or fainting.  Fatigue and weakness.  Shortness of breath.  Chest pain (angina).  Drowsiness.  Confusion.  Dizziness.  How is this diagnosed? This condition may be diagnosed based on:  Your symptoms.  Your medical history.  A physical exam.  During the exam, your health care provider will listen to your heartbeat and check your pulse. To confirm the diagnosis, your health care provider may order tests, such as:  Blood tests.  An electrocardiogram (ECG). This test records the heart's electrical activity. The test can show how fast your heart is beating and whether the heartbeat is steady.  A test in which you wear a portable device (event recorder or Holter monitor) to record your heart's electrical activity while you go about your day.  Anexercise test.  How is this treated? Treatment for this condition depends on the cause of the condition and how severe your symptoms are. Treatment may involve:  Treatment of the underlying condition.  Changing your medicines or how much medicine you take.  Having a small, battery-operated device called a pacemaker implanted under the skin. When bradycardia occurs, this device can be used to increase your heart rate and help your heart to beat in a regular rhythm.  Follow these instructions at home: Lifestyle   Manage any health conditions that contribute to bradycardia as told by your health care provider.  Follow a heart-healthy diet. A nutrition specialist (dietitian) can  help to educate you about healthy food options and changes.  Follow an exercise program that is approved by your health care provider.  Maintain a healthy weight.  Try to reduce or manage your stress, such as with yoga or meditation. If you need help reducing stress, ask your health care provider.  Do not use use any products that contain nicotine or tobacco, such as cigarettes and e-cigarettes.  If you need help quitting, ask your health care provider.  Do not use illegal drugs.  Limit alcohol intake to no more than 1 drink per day for nonpregnant women and 2 drinks per day for men. One drink equals 12 oz of beer, 5 oz of wine, or 1 oz of hard liquor. General instructions  Take over-the-counter and prescription medicines only as told by your health care provider.  Keep all follow-up visits as directed by your health care provider. This is important. How is this prevented? In some cases, bradycardia may be prevented by:  Treating underlying medical problems.  Stopping behaviors or medicines that can trigger the condition.  Contact a health care provider if:  You feel light-headed or dizzy.  You almost faint.  You feel weak or are easily fatigued during physical activity.  You experience confusion or have memory problems. Get help right away if:  You faint.  You have an irregular heartbeat (palpitations).  You have chest pain.  You have trouble breathing. This information is not intended to replace advice given to you by your health care provider. Make sure you discuss any questions you have with your health care provider. Document Released: 03/25/2002 Document Revised: 02/29/2016 Document Reviewed: 12/23/2015 Elsevier Interactive Patient Education  2017 [employer].

## 2018-06-06 LAB — COMPLETE METABOLIC PANEL WITH GFR
AG RATIO: 1.3 (calc) (ref 1.0–2.5)
ALBUMIN MSPROF: 3.9 g/dL (ref 3.6–5.1)
ALT: 17 U/L (ref 9–46)
AST: 20 U/L (ref 10–35)
Alkaline phosphatase (APISO): 55 U/L (ref 40–115)
BILIRUBIN TOTAL: 0.4 mg/dL (ref 0.2–1.2)
BUN: 17 mg/dL (ref 7–25)
CALCIUM: 9.9 mg/dL (ref 8.6–10.3)
CO2: 27 mmol/L (ref 20–32)
Chloride: 105 mmol/L (ref 98–110)
Creat: 1.06 mg/dL (ref 0.70–1.18)
GFR, EST AFRICAN AMERICAN: 79 mL/min/{1.73_m2} (ref >=60)
GFR, EST NON AFRICAN AMERICAN: 68 mL/min/{1.73_m2} (ref >=60)
GLOBULIN: 3 g/dL (ref 1.9–3.7)
Glucose, Bld: 84 mg/dL (ref 65–99)
POTASSIUM: 4.2 mmol/L (ref 3.5–5.3)
SODIUM: 138 mmol/L (ref 135–146)
TOTAL PROTEIN: 6.9 g/dL (ref 6.1–8.1)

## 2018-06-06 LAB — CBC WITH DIFFERENTIAL/PLATELET
BASOS PCT: 1 %
Basophils Absolute: 49 cells/uL (ref 0–200)
EOS ABS: 78 {cells}/uL (ref 15–500)
Eosinophils Relative: 1.6 %
HEMATOCRIT: 33.1 % — AB (ref 38.5–50.0)
HEMOGLOBIN: 10.9 g/dL — AB (ref 13.2–17.1)
LYMPHS ABS: 1000 {cells}/uL (ref 850–3900)
MCH: 27.7 pg (ref 27.0–33.0)
MCHC: 32.9 g/dL (ref 32.0–36.0)
MCV: 84.2 fL (ref 80.0–100.0)
MPV: 10.8 fL (ref 7.5–12.5)
Monocytes Relative: 8.8 %
Neutro Abs: 3342 cells/uL (ref 1500–7800)
Neutrophils Relative %: 68.2 %
Platelets: 239 10*3/uL (ref 140–400)
RBC: 3.93 10*6/uL — AB (ref 4.20–5.80)
RDW: 14.1 % (ref 11.0–15.0)
Total Lymphocyte: 20.4 %
WBC: 4.9 10*3/uL (ref 3.8–10.8)
WBCMIX: 431 {cells}/uL (ref 200–950)

## 2018-06-06 LAB — HEMOGLOBIN A1C
Hgb A1c MFr Bld: 5.7 % of total Hgb — ABNORMAL HIGH (ref <5.7)
Mean Plasma Glucose: 117 (calc)
eAG (mmol/L): 6.5 (calc)

## 2018-06-06 LAB — LIPID PANEL
CHOL/HDL RATIO: 2.2 (calc) (ref <5.0)
Cholesterol: 147 mg/dL (ref <200)
HDL: 66 mg/dL (ref >40)
LDL Cholesterol (Calc): 68 mg/dL (calc)
NON-HDL CHOLESTEROL (CALC): 81 mg/dL (ref <130)
Triglycerides: 53 mg/dL (ref <150)

## 2018-06-06 LAB — TSH: TSH: 1.11 mIU/L (ref 0.40–4.50)

## 2018-06-06 LAB — MAGNESIUM: MAGNESIUM: 1.7 mg/dL (ref 1.5–2.5)

## 2018-06-10 NOTE — Progress Notes (Signed)
Assessment and Plan:  Essential hypertension Sinus bradycardia Improved, continue medications the same, has follow up in Feb Continue to monitor BP/HR Call if any changes or questions  Allergic rhinitis, unspecified seasonality, unspecified trigger -     mometasone (NASONEX) 50 MCG/ACT nasal spray; Place 2 sprays into the nose daily.   Future Appointments  Date Time Provider [place]  09/02/2018  9:00 AM [name], MD [facility] None  12/02/2018 10:45 AM [name], NP [facility] None    HPI 76 y.o.male presents for follow up for sinus bradycardia. His atenolol was cut in half and norvasc was added.  He stopped his atenolol last visit with the simvastatin and is just on norvasc 5 mg. He is not having any more swelling, no palpitations, his BP is running good 110-120's. He states he is feeling well and has had an increase in energy since this switch.   He has some cough and nasal congestion, no SOB, no CP.   Blood pressure 136/84, pulse (!) 54, temperature 97.9 F (36.6 C), weight 253 lb 3.2 oz (114.9 kg), SpO2 97 %.    Past Medical History:  Diagnosis Date  . Adenomatous colon polyp    2010  . Gout   . Hyperlipidemia   . Hypertension   . Prostate cancer ([place]) 2010   treated by external beam radiation  . Type II or unspecified type diabetes mellitus without mention of complication, not stated as uncontrolled   . Vitamin D deficiency      Allergies  Allergen Reactions  . Ace Inhibitors     Current Outpatient Medications on File Prior to Visit  Medication Sig  . allopurinol (ZYLOPRIM) 300 MG tablet TAKE 1 TABLET BY MOUTH ONCE DAILY FOR  GOUT  . ALPRAZolam (XANAX) 1 MG tablet Take 1/2 to 1 tablet 2 to 3 x / day ONLY if needed for Anxiety Attacks & please try to limit to 5 days /week to avoid addiction  . amLODipine (NORVASC) 5 MG tablet Take 1 tablet (5 mg total) by mouth daily.  Marland Kitchen aspirin (ASPIRIN LOW DOSE) 81 MG tablet Take 81 mg by mouth  daily.   Marland Kitchen atorvastatin (LIPITOR) 10 MG tablet Take 1 tablet (10 mg total) by mouth daily.  . bumetanide (BUMEX) 2 MG tablet TAKE 1 TABLET BY MOUTH TWICE DAILY  . Cholecalciferol (VITAMIN D PO) Take 10,000 Units by mouth.   . IRON PO Take 18 mg by mouth daily.  Marland Kitchen MAGNESIUM PO Take 500 mg by mouth 2 (two) times daily.    No current facility-administered medications on file prior to visit.     ROS: all negative except above.   Physical Exam: Filed Weights   06/11/18 1134  Weight: 253 lb 3.2 oz (114.9 kg)   BP 136/84   Pulse (!) 54   Temp 97.9 F (36.6 C)   Wt 253 lb 3.2 oz (114.9 kg)   SpO2 97%   BMI 32.51 kg/m  General Appearance: Well nourished, in no apparent distress. Eyes: PERRLA, EOMs, conjunctiva no swelling or erythema Sinuses: No Frontal/maxillary tenderness ENT/Mouth: Ext aud canals clear, TMs without erythema, bulging. No erythema, swelling, or exudate on post pharynx.  Tonsils not swollen or erythematous. Hearing normal.  Neck: Supple, thyroid normal.  Respiratory: Respiratory effort normal, BS equal bilaterally without rales, rhonchi, wheezing or stridor.  Cardio: RRR with no MRGs. Brisk peripheral pulses with mild edema bilateral  Abdomen: Soft, + BS,obese,  Non tender, no guarding, rebound, hernias, masses. Lymphatics:  Non tender without lymphadenopathy.  Musculoskeletal: Full ROM, 5/5 strength, normal gait.  Skin: Warm, dry without rashes, lesions, ecchymosis.  Neuro: Cranial nerves intact. Normal muscle tone, no cerebellar symptoms. Sensation intact.  Psych: Awake and oriented X 3, normal affect, Insight and Judgment appropriate.     Vicie Mutters, PA-C 11:54 AM [facility]

## 2018-06-11 ENCOUNTER — Encounter: Payer: Self-pay | Admitting: Physician Assistant

## 2018-06-11 ENCOUNTER — Ambulatory Visit (INDEPENDENT_AMBULATORY_CARE_PROVIDER_SITE_OTHER): Payer: Medicare Other | Admitting: Physician Assistant

## 2018-06-11 VITALS — BP 136/84 | HR 54 | Temp 97.9°F | Wt 253.2 lb

## 2018-06-11 DIAGNOSIS — I10 Essential (primary) hypertension: Secondary | ICD-10-CM

## 2018-06-11 DIAGNOSIS — R001 Bradycardia, unspecified: Secondary | ICD-10-CM | POA: Diagnosis not present

## 2018-06-11 DIAGNOSIS — J309 Allergic rhinitis, unspecified: Secondary | ICD-10-CM | POA: Diagnosis not present

## 2018-06-11 MED ORDER — MOMETASONE FUROATE 50 MCG/ACT NA SUSP: 2.0000 | Freq: Every day | NASAL | 2 refills | Status: DC

## 2018-06-11 NOTE — Patient Instructions (Signed)
Continue the norvasc 5 mg for your blood pressure Stay OFF the atenolol for your heart rate and blood pressure Let us know if you get any palpitations or fast heart beats being off the atenolol but I do not think it will be an issue   HYPERTENSION INFORMATION  Monitor your blood pressure at home, please keep a record and bring that in with you to your next office visit.   Go to the ER if any CP, SOB, nausea, dizziness, severe HA, changes vision/speech  Due to a recent study, SPRINT, we have changed our goal for the systolic or top blood pressure number. Ideally we want your top number at 120.  In the [facility] Trial, 5000 people were randomized to a goal BP of 120 and 5000 people were randomized to a goal BP of less than 140. The patients with the goal BP at 120 had LESS DEMENTIA, LESS HEART ATTACKS, AND LESS STROKES, AS WELL AS OVERALL DECREASED MORTALITY OR DEATH RATE.   If you are willing, our goal BP is the top number of 120.  Your most recent BP: BP: 136/84   Take your medications faithfully as instructed. Maintain a healthy weight. Get at least 150 minutes of aerobic exercise per week. Minimize salt intake. Minimize alcohol intake  DASH Eating Plan DASH stands for "Dietary Approaches to Stop Hypertension." The DASH eating plan is a healthy eating plan that has been shown to reduce high blood pressure (hypertension). Additional health benefits may include reducing the risk of type 2 diabetes mellitus, heart disease, and stroke. The DASH eating plan may also help with weight loss. WHAT DO I NEED TO KNOW ABOUT THE DASH EATING PLAN? For the DASH eating plan, you will follow these general guidelines:  Choose foods with a percent daily value for sodium of less than 5% (as listed on the food label).  Use salt-free seasonings or herbs instead of table salt or sea salt.  Check with your health care provider or pharmacist before using salt substitutes.  Eat lower-sodium products, often  labeled as "lower sodium" or "no salt added."  Eat fresh foods.  Eat more vegetables, fruits, and low-fat dairy products.  Choose whole grains. Look for the word "whole" as the first word in the ingredient list.  Choose fish and skinless chicken or [place] more often than red meat. Limit fish, poultry, and meat to 6 oz (170 g) each day.  Limit sweets, desserts, sugars, and sugary drinks.  Choose heart-healthy fats.  Limit cheese to 1 oz (28 g) per day.  Eat more home-cooked food and less restaurant, buffet, and fast food.  Limit fried foods.  Cook foods using methods other than frying.  Limit canned vegetables. If you do use them, rinse them well to decrease the sodium.  When eating at a restaurant, ask that your food be prepared with less salt, or no salt if possible. WHAT FOODS CAN I EAT? Seek help from a dietitian for individual calorie needs. Grains Whole grain or whole wheat bread. Brown rice. Whole grain or whole wheat pasta. Quinoa, bulgur, and whole grain cereals. Low-sodium cereals. Corn or whole wheat flour tortillas. Whole grain cornbread. Whole grain crackers. Low-sodium crackers. Vegetables Fresh or frozen vegetables (raw, steamed, roasted, or grilled). Low-sodium or reduced-sodium tomato and vegetable juices. Low-sodium or reduced-sodium tomato sauce and paste. Low-sodium or reduced-sodium canned vegetables.  Fruits All fresh, canned (in natural juice), or frozen fruits. Meat and Other Protein Products Ground beef (85% or leaner), grass-fed beef,  or beef trimmed of fat. Skinless chicken or [place]. Ground chicken or [place]. Pork trimmed of fat. All fish and seafood. Eggs. Dried beans, peas, or lentils. Unsalted nuts and seeds. Unsalted canned beans. Dairy Low-fat dairy products, such as skim or 1% milk, 2% or reduced-fat cheeses, low-fat ricotta or cottage cheese, or plain low-fat yogurt. Low-sodium or reduced-sodium cheeses. Fats and Oils Tub margarines without  trans fats. Light or reduced-fat mayonnaise and salad dressings (reduced sodium). Avocado. Safflower, olive, or canola oils. Natural peanut or almond butter. Other Unsalted popcorn and pretzels. The items listed above may not be a complete list of recommended foods or beverages. Contact your dietitian for more options. WHAT FOODS ARE NOT RECOMMENDED? Grains White bread. White pasta. White rice. Refined cornbread. Bagels and croissants. Crackers that contain trans fat. Vegetables Creamed or fried vegetables. Vegetables in a cheese sauce. Regular canned vegetables. Regular canned tomato sauce and paste. Regular tomato and vegetable juices. Fruits Dried fruits. Canned fruit in light or heavy syrup. Fruit juice. Meat and Other Protein Products Fatty cuts of meat. Ribs, chicken wings, bacon, sausage, bologna, salami, chitterlings, fatback, hot dogs, bratwurst, and packaged luncheon meats. Salted nuts and seeds. Canned beans with salt. Dairy Whole or 2% milk, cream, half-and-half, and cream cheese. Whole-fat or sweetened yogurt. Full-fat cheeses or blue cheese. Nondairy creamers and whipped toppings. Processed cheese, cheese spreads, or cheese curds. Condiments Onion and garlic salt, seasoned salt, table salt, and sea salt. Canned and packaged gravies. Worcestershire sauce. Tartar sauce. Barbecue sauce. Teriyaki sauce. Soy sauce, including reduced sodium. Steak sauce. Fish sauce. Oyster sauce. Cocktail sauce. Horseradish. Ketchup and mustard. Meat flavorings and tenderizers. Bouillon cubes. Hot sauce. Tabasco sauce. Marinades. Taco seasonings. Relishes. Fats and Oils Butter, stick margarine, lard, shortening, ghee, and bacon fat. Coconut, palm kernel, or palm oils. Regular salad dressings. Other Pickles and olives. Salted popcorn and pretzels. The items listed above may not be a complete list of foods and beverages to avoid. Contact your dietitian for more information. WHERE CAN I FIND MORE  INFORMATION? National Heart, Lung, and Blood Institute: travelstabloid.com Document Released: 06/22/2011 Document Revised: 11/17/2013 Document Reviewed: 05/07/2013 [facility] Patient Information 2015 [place], [place]. This information is not intended to replace advice given to you by your health care provider. Make sure you discuss any questions you have with your health care provider.   Palpitations A palpitation is the feeling that your heart:  Has an uneven (irregular) heartbeat.  Is beating faster than normal.  Is fluttering.  Is skipping a beat.  This is usually not a serious problem. In some cases, you may need more medical tests. Follow these instructions at home:  Avoid: ? Caffeine in coffee, tea, soft drinks, diet pills, and energy drinks. ? Chocolate. ? Alcohol.  Do not use any tobacco products. These include cigarettes, chewing tobacco, and e-cigarettes. If you need help quitting, ask your doctor.  Try to reduce your stress. These things may help: ? Yoga. ? Meditation. ? Physical activity. Swimming, jogging, and walking are good choices. ? A method that helps you use your mind to control things in your body, like heartbeats (biofeedback).  Get plenty of rest and sleep.  Take over-the-counter and prescription medicines only as told by your doctor.  Keep all follow-up visits as told by your doctor. This is important. Contact a doctor if:  Your heartbeat is still fast or uneven after 24 hours.  Your palpitations occur more often. Get help right away if:  You have chest pain.  You feel short of breath.  You have a very bad headache.  You feel dizzy.  You pass out (faint). This information is not intended to replace advice given to you by your health care provider. Make sure you discuss any questions you have with your health care provider. Document Released: 04/11/2008 Document Revised: 12/09/2015 Document Reviewed:  03/18/2015 Elsevier Interactive Patient Education  [employer].

## 2018-06-25 DIAGNOSIS — C61 Malignant neoplasm of prostate: Secondary | ICD-10-CM | POA: Diagnosis not present

## 2018-06-25 DIAGNOSIS — N5201 Erectile dysfunction due to arterial insufficiency: Secondary | ICD-10-CM | POA: Diagnosis not present

## 2018-07-18 ENCOUNTER — Other Ambulatory Visit: Payer: Self-pay | Admitting: Internal Medicine

## 2018-07-18 MED ORDER — AZITHROMYCIN 250 MG PO TABS: ORAL_TABLET | ORAL | 1 refills | Status: DC

## 2018-07-18 MED ORDER — PREDNISONE 20 MG PO TABS: ORAL_TABLET | ORAL | 0 refills | Status: DC

## 2018-09-01 ENCOUNTER — Encounter: Payer: Self-pay | Admitting: Internal Medicine

## 2018-09-01 NOTE — Progress Notes (Signed)
[facility]   [name], M.D.     Uvaldo Bristle. Silverio Lay, P.A.-C [name], [place]                [address] [place], [place] [address] Telephone [phone] Telefax [phone]  Comprehensive Evaluation & Examination     This very nice 78 y.o. DBM presents for a comprehensive evaluation and management of multiple medical co-morbidities.  Patient has been followed for HTN, HLD, T2_NIDDM  and Vitamin D Deficiency. Patient has hx/o Gout controlled with his meds.     HTN predates circa 1989. Patient's BP has been controlled at home.  Today's BP is at goal - 136/76.  Patient has hx/o a False (+) Cardiolite with a negative / Normal Heart Cath in 2008. Patient denies any cardiac symptoms as chest pain, palpitations, shortness of breath, dizziness or ankle swelling.     Patient's hyperlipidemia is controlled with diet and medications. Patient denies myalgias or other medication SE's. Last lipids were at goal: Lab Results  Component Value Date   CHOL 147 06/05/2018   HDL 66 06/05/2018   LDLCALC 68 06/05/2018   TRIG 53 06/05/2018   CHOLHDL 2.2 06/05/2018      Patient has Morbid Obesity and hx/o T2_NIDDM /CKD2 ( 2005)  and patient denies reactive hypoglycemic symptoms, visual blurring, diabetic polys or paresthesias. Patient has los 35# over the last year from 283# to current 248# attributed to better eating habits. Last A1c was at goal:  Lab Results  Component Value Date   HGBA1C 5.7 (H) 06/05/2018       Finally, patient has history of Vitamin D Deficiency ("29" / 2008)   and last vitamin D was at goal: Lab Results  Component Value Date   VD25OH 105 (H) 02/28/2018   Current Outpatient Medications on File Prior to Visit  Medication Sig  . allopurinol (ZYLOPRIM) 300 MG tablet TAKE 1 TABLET BY MOUTH ONCE DAILY FOR  GOUT  . ALPRAZolam (XANAX) 1 MG tablet Take 1/2 to 1 tablet 2 to 3 x / day  ONLY if needed for Anxiety Attacks & please try to limit to 5 days /week to avoid addiction  . amLODipine (NORVASC) 5 MG tablet Take 1 tablet (5 mg total) by mouth daily.  Marland Kitchen aspirin (ASPIRIN LOW DOSE) 81 MG tablet Take 81 mg by mouth daily.   Marland Kitchen atorvastatin (LIPITOR) 10 MG tablet Take 1 tablet (10 mg total) by mouth daily.  . bumetanide (BUMEX) 2 MG tablet TAKE 1 TABLET BY MOUTH TWICE DAILY  . Cholecalciferol (VITAMIN D PO) Take 10,000 Units by mouth.   . IRON PO Take 18 mg by mouth daily.  Marland Kitchen MAGNESIUM PO Take 500 mg by mouth 2 (two) times daily.   . mometasone (NASONEX) 50 MCG/ACT nasal spray Place 2 sprays into the nose daily.   No current facility-administered medications on file prior to visit.    Allergies  Allergen Reactions  . Ace Inhibitors    Past Medical History:  Diagnosis Date  . Adenomatous colon polyp    2010  . Gout   . Hyperlipidemia   . Hypertension   . Prostate cancer ([place]) 2010   treated by external beam radiation  . Type II or unspecified type diabetes mellitus without mention of complication, not stated as uncontrolled   . Vitamin D deficiency  Health Maintenance  Topic Date Due  . INFLUENZA VACCINE  02/14/2018  . URINE MICROALBUMIN  08/16/2018  . HEMOGLOBIN A1C  12/04/2018  . OPHTHALMOLOGY EXAM  01/23/2019  . FOOT EXAM  09/02/2019  . COLONOSCOPY  07/21/2021  . TETANUS/TDAP  03/21/2022  . PNA vac Low Risk Adult  Completed   Immunization History  Administered Date(s) Administered  . DT 03/21/2012  . Influenza-Unspecified 04/16/2014, 04/16/2015, 04/16/2018  . Pneumococcal Conjugate-13 05/19/2014  . Pneumococcal Polysaccharide-23 07/24/2007   Last Colon - 07/21/2014 - Dr Deatra Ina - recommended 7 yr f/u - due Jan 2023  Past Surgical History:  Procedure Laterality Date  . APPENDECTOMY  1994   and GALLBLADDER  . CHOLECYSTECTOMY  1994  . colovesical fistula repair  01/2011  . KNEE SURGERY  1996   Family History  Problem Relation Age of Onset  .  Colon cancer Neg Hx   . Esophageal cancer Neg Hx   . Rectal cancer Neg Hx   . Stomach cancer Neg Hx    Social History   Socioeconomic History  . Marital status: Divorced  . Number of children: Not on file  Occupational History  . Retired  Tobacco Use  . Smoking status: Former Smoker    Last attempt to quit: 07/17/1985    Years since quitting: 33.1  . Smokeless tobacco: Never Used  Substance and Sexual Activity  . Alcohol use: No    Alcohol/week: 0.0 standard drinks  . Drug use: No  . Sexual activity: Not on file    ROS Constitutional: Denies fever, chills, weight loss/gain, headaches, insomnia,  night sweats or change in appetite. Does c/o fatigue. Eyes: Denies redness, blurred vision, diplopia, discharge, itchy or watery eyes.  ENT: Denies discharge, congestion, post nasal drip, epistaxis, sore throat, earache, hearing loss, dental pain, Tinnitus, Vertigo, Sinus pain or snoring.  Cardio: Denies chest pain, palpitations, irregular heartbeat, syncope, dyspnea, diaphoresis, orthopnea, PND, claudication or edema Respiratory: denies cough, dyspnea, DOE, pleurisy, hoarseness, laryngitis or wheezing.  Gastrointestinal: Denies dysphagia, heartburn, reflux, water brash, pain, cramps, nausea, vomiting, bloating, diarrhea, constipation, hematemesis, melena, hematochezia, jaundice or hemorrhoids Genitourinary: Denies dysuria, frequency,  discharge, hematuria or flank pain. Has urgency, nocturia x 2-3 & occasional hesitancy. Musculoskeletal: Denies arthralgia, myalgia, stiffness, Jt. Swelling, pain, limp or strain/sprain. Denies Falls. Skin: Denies puritis, rash, hives, warts, acne, eczema or change in skin lesion Neuro: No weakness, tremor, incoordination, spasms, paresthesia or pain Psychiatric: Denies confusion, memory loss or sensory loss. Denies Depression. Endocrine: Denies change in weight, skin, hair change, nocturia, and paresthesia, diabetic polys, visual blurring or hyper / hypo  glycemic episodes.  Heme/Lymph: No excessive bleeding, bruising or enlarged lymph nodes.  Physical Exam  BP 136/76   Pulse 88   Temp (!) 97 F (36.1 C)   Resp 16   Ht 6\' 2"  (1.88 m)   Wt 248 lb 9.6 oz (112.8 kg)   BMI 31.92 kg/m   General Appearance: Well nourished and well groomed and in no apparent distress.  Eyes: PERRLA, EOMs, conjunctiva no swelling or erythema, normal fundi and vessels. Sinuses: No frontal/maxillary tenderness ENT/Mouth: EACs patent / TMs  nl. Nares clear without erythema, swelling, mucoid exudates. Oral hygiene is good. No erythema, swelling, or exudate. Tongue normal, non-obstructing. Tonsils not swollen or erythematous. Hearing normal.  Neck: Supple, thyroid not palpable. No bruits, nodes or JVD. Respiratory: Respiratory effort normal.  BS equal and clear bilateral without rales, rhonci, wheezing or stridor. Cardio: Heart sounds are normal with regular rate  and rhythm and no murmurs, rubs or gallops. Peripheral pulses are normal and equal bilaterally without edema. No aortic or femoral bruits. Chest: symmetric with normal excursions and percussion.  Abdomen: Soft, rotund with Nl bowel sounds. Nontender, no guarding, rebound, hernias, masses, or organomegaly.  Lymphatics: Non tender without lymphadenopathy.  Musculoskeletal: Full ROM all peripheral extremities, joint stability, 5/5 strength, and normal gait. Skin: Warm and dry without rashes, lesions, cyanosis, clubbing or  ecchymosis.  Neuro: Cranial nerves intact, reflexes equal bilaterally. Normal muscle tone, no cerebellar symptoms. Sensation intact to touch, vibratory and Monofilament to the toes bilaterally. Pysch: Alert and oriented X 3 with normal affect, insight and judgment appropriate.   Assessment and Plan  1. Essential hypertension  - EKG 12-Lead - [place], RETROPERITNL ABD,  LTD - Urinalysis, Routine w reflex microscopic - Microalbumin / creatinine urine ratio - CBC with  Differential/Platelet - COMPLETE METABOLIC PANEL WITH GFR - Magnesium - TSH  2. Hyperlipidemia, mixed  - EKG 12-Lead - [place], RETROPERITNL ABD,  LTD - Lipid panel - TSH  3. Type 2 diabetes mellitus with stage 2 chronic kidney disease, without long-term current use of insulin (HCC)  - EKG 12-Lead - [place], RETROPERITNL ABD,  LTD - Urinalysis, Routine w reflex microscopic - Microalbumin / creatinine urine ratio - HM DIABETES FOOT EXAM - LOW EXTREMITY NEUR EXAM DOCUM - Hemoglobin A1c - Insulin, random  4. Vitamin D deficiency  - VITAMIN D 25 Hydroxyl  5. Idiopathic gout  - Uric acid  6. Screening for colorectal cancer  - POC Hemoccult Bld/Stl  7. BPH with obstruction/lower urinary tract symptoms  - PSA  8. Class 2 severe obesity due to excess calories with serious comorbidity and body mass index (BMI) of 37.0 to 37.9 in adult ([place])   9. Prostate cancer screening  - PSA  10. Screening for ischemic heart disease  - EKG 12-Lead  11. Former smoker  - EKG 12-Lead - [place], RETROPERITNL ABD,  LTD  12. Family history of ischemic heart disease  - EKG 12-Lead - [place], RETROPERITNL ABD,  LTD  13. Screening for AAA (aortic abdominal aneurysm)  - [place], RETROPERITNL ABD,  LTD  14. Medication management  - Urinalysis, Routine w reflex microscopic - Microalbumin / creatinine urine ratio - Uric acid - CBC with Differential/Platelet - COMPLETE METABOLIC PANEL WITH GFR - Magnesium - Lipid panel - TSH - Hemoglobin A1c - Insulin, random - VITAMIN D 25 Hydroxyl         Patient was counseled in prudent diet, weight control to achieve/maintain BMI less than 25, BP monitoring, regular exercise and medications as discussed.  Discussed med effects and SE's. Routine screening labs and tests as requested with regular follow-up as recommended. Over 40 minutes of exam, counseling, chart review and high complex critical decision making was performed

## 2018-09-01 NOTE — Patient Instructions (Signed)

## 2018-09-02 ENCOUNTER — Ambulatory Visit (INDEPENDENT_AMBULATORY_CARE_PROVIDER_SITE_OTHER): Payer: Medicare Other | Admitting: Internal Medicine

## 2018-09-02 VITALS — BP 136/76 | HR 88 | Temp 97.0°F | Resp 16 | Ht 74.0 in | Wt 248.6 lb

## 2018-09-02 DIAGNOSIS — E782 Mixed hyperlipidemia: Secondary | ICD-10-CM | POA: Diagnosis not present

## 2018-09-02 DIAGNOSIS — Z79899 Other long term (current) drug therapy: Secondary | ICD-10-CM | POA: Diagnosis not present

## 2018-09-02 DIAGNOSIS — N401 Enlarged prostate with lower urinary tract symptoms: Secondary | ICD-10-CM | POA: Diagnosis not present

## 2018-09-02 DIAGNOSIS — E1122 Type 2 diabetes mellitus with diabetic chronic kidney disease: Secondary | ICD-10-CM

## 2018-09-02 DIAGNOSIS — Z6837 Body mass index (BMI) 37.0-37.9, adult: Secondary | ICD-10-CM

## 2018-09-02 DIAGNOSIS — E559 Vitamin D deficiency, unspecified: Secondary | ICD-10-CM | POA: Diagnosis not present

## 2018-09-02 DIAGNOSIS — Z87891 Personal history of nicotine dependence: Secondary | ICD-10-CM

## 2018-09-02 DIAGNOSIS — Z125 Encounter for screening for malignant neoplasm of prostate: Secondary | ICD-10-CM

## 2018-09-02 DIAGNOSIS — Z8249 Family history of ischemic heart disease and other diseases of the circulatory system: Secondary | ICD-10-CM

## 2018-09-02 DIAGNOSIS — Z136 Encounter for screening for cardiovascular disorders: Secondary | ICD-10-CM

## 2018-09-02 DIAGNOSIS — M1000 Idiopathic gout, unspecified site: Secondary | ICD-10-CM

## 2018-09-02 DIAGNOSIS — N138 Other obstructive and reflux uropathy: Secondary | ICD-10-CM | POA: Diagnosis not present

## 2018-09-02 DIAGNOSIS — I10 Essential (primary) hypertension: Secondary | ICD-10-CM

## 2018-09-02 DIAGNOSIS — Z1211 Encounter for screening for malignant neoplasm of colon: Secondary | ICD-10-CM

## 2018-09-02 DIAGNOSIS — Z1212 Encounter for screening for malignant neoplasm of rectum: Secondary | ICD-10-CM

## 2018-09-02 DIAGNOSIS — N182 Chronic kidney disease, stage 2 (mild): Secondary | ICD-10-CM

## 2018-09-03 LAB — CBC WITH DIFFERENTIAL/PLATELET
Absolute Monocytes: 374 cells/uL (ref 200–950)
Basophils Absolute: 41 cells/uL (ref 0–200)
Basophils Relative: 0.9 %
EOS ABS: 41 {cells}/uL (ref 15–500)
Eosinophils Relative: 0.9 %
HCT: 37.1 % — ABNORMAL LOW (ref 38.5–50.0)
Hemoglobin: 12.2 g/dL — ABNORMAL LOW (ref 13.2–17.1)
Lymphs Abs: 891 cells/uL (ref 850–3900)
MCH: 27.8 pg (ref 27.0–33.0)
MCHC: 32.9 g/dL (ref 32.0–36.0)
MCV: 84.5 fL (ref 80.0–100.0)
MPV: 10.6 fL (ref 7.5–12.5)
Monocytes Relative: 8.3 %
Neutro Abs: 3155 cells/uL (ref 1500–7800)
Neutrophils Relative %: 70.1 %
Platelets: 275 10*3/uL (ref 140–400)
RBC: 4.39 10*6/uL (ref 4.20–5.80)
RDW: 13.7 % (ref 11.0–15.0)
Total Lymphocyte: 19.8 %
WBC: 4.5 10*3/uL (ref 3.8–10.8)

## 2018-09-03 LAB — URINALYSIS, ROUTINE W REFLEX MICROSCOPIC
Bilirubin Urine: NEGATIVE
Glucose, UA: NEGATIVE
Hgb urine dipstick: NEGATIVE
Ketones, ur: NEGATIVE
Leukocytes,Ua: NEGATIVE
Nitrite: NEGATIVE
PH: 6.5 (ref 5.0–8.0)
Protein, ur: NEGATIVE
SPECIFIC GRAVITY, URINE: 1.02 (ref 1.001–1.03)

## 2018-09-03 LAB — COMPLETE METABOLIC PANEL WITH GFR
AG Ratio: 1.4 (calc) (ref 1.0–2.5)
ALT: 14 U/L (ref 9–46)
AST: 18 U/L (ref 10–35)
Albumin: 4.2 g/dL (ref 3.6–5.1)
Alkaline phosphatase (APISO): 64 U/L (ref 35–144)
BUN: 15 mg/dL (ref 7–25)
CO2: 28 mmol/L (ref 20–32)
Calcium: 10.4 mg/dL — ABNORMAL HIGH (ref 8.6–10.3)
Chloride: 102 mmol/L (ref 98–110)
Creat: 1.1 mg/dL (ref 0.70–1.18)
GFR, Est African American: 75 mL/min/{1.73_m2} (ref >=60)
GFR, Est Non African American: 64 mL/min/{1.73_m2} (ref >=60)
Globulin: 3 g/dL (calc) (ref 1.9–3.7)
Glucose, Bld: 94 mg/dL (ref 65–99)
Potassium: 4 mmol/L (ref 3.5–5.3)
Sodium: 138 mmol/L (ref 135–146)
Total Bilirubin: 0.6 mg/dL (ref 0.2–1.2)
Total Protein: 7.2 g/dL (ref 6.1–8.1)

## 2018-09-03 LAB — LIPID PANEL
Cholesterol: 177 mg/dL (ref <200)
HDL: 82 mg/dL (ref >=40)
LDL Cholesterol (Calc): 82 mg/dL (calc)
Non-HDL Cholesterol (Calc): 95 mg/dL (calc) (ref <130)
Total CHOL/HDL Ratio: 2.2 (calc) (ref <5.0)
Triglycerides: 51 mg/dL (ref <150)

## 2018-09-03 LAB — URIC ACID: Uric Acid, Serum: 5.7 mg/dL (ref 4.0–8.0)

## 2018-09-03 LAB — MICROALBUMIN / CREATININE URINE RATIO
Creatinine, Urine: 201 mg/dL (ref 20–320)
Microalb Creat Ratio: 6 mcg/mg creat (ref <30)
Microalb, Ur: 1.2 mg/dL

## 2018-09-03 LAB — TSH: TSH: 0.93 m[IU]/L (ref 0.40–4.50)

## 2018-09-03 LAB — MAGNESIUM: MAGNESIUM: 1.7 mg/dL (ref 1.5–2.5)

## 2018-09-03 LAB — INSULIN, RANDOM: Insulin: 6.6 u[IU]/mL

## 2018-09-03 LAB — VITAMIN D 25 HYDROXY (VIT D DEFICIENCY, FRACTURES): Vit D, 25-Hydroxy: 67 ng/mL (ref 30–100)

## 2018-09-03 LAB — HEMOGLOBIN A1C
EAG (MMOL/L): 7 (calc)
Hgb A1c MFr Bld: 6 % of total Hgb — ABNORMAL HIGH (ref <5.7)
Mean Plasma Glucose: 126 (calc)

## 2018-09-03 LAB — PSA: PSA: 0.3 ng/mL (ref <=4.0)

## 2018-09-09 ENCOUNTER — Encounter: Payer: Self-pay | Admitting: *Deleted

## 2018-09-16 ENCOUNTER — Other Ambulatory Visit: Payer: Self-pay | Admitting: *Deleted

## 2018-09-16 MED ORDER — BLOOD GLUCOSE MONITOR KIT: PACK | 0 refills | Status: DC

## 2018-09-16 MED ORDER — LANCETS MISC: 1 refills | Status: DC

## 2018-09-17 ENCOUNTER — Other Ambulatory Visit: Payer: Self-pay | Admitting: *Deleted

## 2018-09-17 DIAGNOSIS — Z1212 Encounter for screening for malignant neoplasm of rectum: Principal | ICD-10-CM

## 2018-09-17 DIAGNOSIS — Z1211 Encounter for screening for malignant neoplasm of colon: Secondary | ICD-10-CM | POA: Diagnosis not present

## 2018-09-17 LAB — POC HEMOCCULT BLD/STL (HOME/3-CARD/SCREEN)
Card #2 Fecal Occult Blod, POC: NEGATIVE
Card #3 Fecal Occult Blood, POC: NEGATIVE
Fecal Occult Blood, POC: NEGATIVE

## 2018-09-23 ENCOUNTER — Other Ambulatory Visit: Payer: Self-pay | Admitting: *Deleted

## 2018-09-23 ENCOUNTER — Telehealth: Payer: Self-pay | Admitting: *Deleted

## 2018-09-23 MED ORDER — METFORMIN HCL ER 500 MG PO TB24: ORAL_TABLET | ORAL | Status: DC

## 2018-09-23 NOTE — Telephone Encounter (Signed)
Patient called and states Metformin is not on his medication list.  Per Dr Melford Aase, the patient should continue taking the medication and it was added back on his medication list.

## 2018-09-30 ENCOUNTER — Other Ambulatory Visit: Payer: Self-pay | Admitting: Internal Medicine

## 2018-09-30 MED ORDER — PROMETHAZINE-PHENYLEPHRINE 6.25-5 MG/5ML PO SYRP: ORAL_SOLUTION | ORAL | 3 refills | Status: DC

## 2018-09-30 MED ORDER — MONTELUKAST SODIUM 10 MG PO TABS: ORAL_TABLET | ORAL | 3 refills | Status: DC

## 2018-10-06 ENCOUNTER — Other Ambulatory Visit: Payer: Self-pay | Admitting: Internal Medicine

## 2018-11-26 ENCOUNTER — Other Ambulatory Visit: Payer: Self-pay

## 2018-11-26 MED ORDER — MOMETASONE FUROATE 50 MCG/ACT NA SUSP: 2.0000 | Freq: Every day | NASAL | 2 refills | Status: DC

## 2018-12-02 ENCOUNTER — Ambulatory Visit: Payer: Self-pay | Admitting: Adult Health

## 2018-12-10 ENCOUNTER — Ambulatory Visit: Payer: Self-pay | Admitting: Adult Health

## 2018-12-16 NOTE — Progress Notes (Signed)
MEDICARE ANNUAL WELLNESS VISIT AND FOLLOW UP Assessment:   Diagnoses and all orders for this visit:  Encounter for Medicare annual wellness exam  Essential hypertension Continue medications - atenolol and bumetanide Monitor blood pressure at home; call if consistently over 130/80 Continue DASH diet.   Reminder to go to the ER if any CP, SOB, nausea, dizziness, severe HA, changes vision/speech, left arm numbness and tingling and jaw pain.  Type 2 diabetes mellitus with stage 2 chronic kidney disease, without long-term current use of insulin [facility]) Education: Reviewed 'ABCs' of diabetes management (respective goals in parentheses):  A1C (<7), blood pressure (<130/80), and cholesterol (LDL <70) Eye Exam yearly and Dental Exam every 6 months. Dietary recommendations Physical Activity recommendations Foot exam UTD  CKD stage 2 due to type 2 diabetes mellitus (HCC) Increase fluids, avoid NSAIDS, monitor sugars, will monitor  Vitamin D deficiency At goal at recent check; continue to recommend supplementation for goal of 60-100 Defer vitamin D level  Morbid obesity (BMI 37.89) Long discussion about weight loss, diet, and exercise Recommended diet heavy in fruits and veggies and low in animal meats, cheeses, and dairy products, appropriate calorie intake He will work on cutting down on sweets Discussed appropriate weight for height  - next weight goal of 230 lb, long term goal <190lb per patient Follow up at next visit  Medication management CBC, CMP/GFR, magnesium   Hypertensive retinopathy of both eyes Control BP, continue follow up with ophthalmology  Mixed hyperlipidemia Continue medications; currently at goal Continue low cholesterol diet and exercise.  Check lipid panel.   History of colonic polyps Up to date on colonoscopies  H/O prostate cancer (2006) Continue to monitor  Idiopathic gout, unspecified chronicity, unspecified site Continue allopurinol Diet  discussed Check uric acid as needed  Anxiety Well managed by current regimen; reminded to avoid daily use of benzo to limit tolerance/addiction Stress management techniques discussed, increase water, good sleep hygiene discussed, increase exercise, and increase veggies.     Over 30 minutes of exam, counseling, chart review, and critical decision making was performed  Future Appointments  Date Time Provider [place]  03/13/2019  9:30 AM [name], MD [facility] None  09/22/2019  9:00 AM [name], MD [facility] None     Plan:   During the course of the visit the patient was educated and counseled about appropriate screening and preventive services including:    Pneumococcal vaccine   Influenza vaccine  Prevnar 13  Td vaccine  Screening electrocardiogram  Colorectal cancer screening  Diabetes screening  Glaucoma screening  Nutrition counseling    Subjective:  Marcus Chambers is a 78 y.o. male who presents for Medicare Annual Wellness Visit and 3 month follow up for HTN, hyperlipidemia, T2DM, and vitamin D Def.   He has hx of prostate cancer circa 2010, underwent radiation, now followed annually by [facility], last visit 06/25/2018. PSAs have been stable, denies urinary sx.   he has a diagnosis of anxiety/insomnia and is currently on xanax 0.5-1 mg TID PRN, reports symptoms are well controlled on current regimen. he reports he currently typically takes 0.5 mg at night for sleep if needed, typically takes 3-4 times a week.   BMI is Body mass index is 31.87 kg/m., he has been working on diet, admittedly exercise is limited, down from 284 lb and well.  Wt Readings from Last 3 Encounters:  12/18/18 248 lb 3.2 oz (112.6 kg)  09/02/18 248 lb 9.6 oz (112.8 kg)  06/11/18 253 lb  3.2 oz (114.9 kg)   His blood pressure has been controlled at home (130/75), today their BP is BP: 128/74 He does workout. He denies chest pain, shortness of breath,  dizziness.   He is on cholesterol medication (atorvastatin 10 mg daily) and denies myalgias. His cholesterol is at goal. The cholesterol last visit was:   Lab Results  Component Value Date   CHOL 177 09/02/2018   HDL 82 09/02/2018   LDLCALC 82 09/02/2018   TRIG 51 09/02/2018   CHOLHDL 2.2 09/02/2018   He has been working on diet and exercise for T2 diabetes controlled on metformin, and denies foot ulcerations, increased appetite, nausea, paresthesia of the feet, polydipsia, polyuria, visual disturbances, vomiting and weight loss. He checks sugars BID, "hasn't been bad" but cannot recall exact numbers today, estimates 80-90s typically. Last A1C in the office was:  Lab Results  Component Value Date   HGBA1C 6.0 (H) 09/02/2018   Last GFR Lab Results  Component Value Date   GFRNONAA 64 09/02/2018    Patient is on Vitamin D supplement and at goal at recent check:   Lab Results  Component Value Date   VD25OH 67 09/02/2018     Patient is on allopurinol for gout and does not report a recent flare.  Lab Results  Component Value Date   LABURIC 5.7 09/02/2018     Medication Review:  Current Outpatient Medications (Endocrine & Metabolic):  .  metFORMIN (GLUCOPHAGE-XR) 500 MG 24 hr tablet, Take 2 tablets twice a day.  Current Outpatient Medications (Cardiovascular):  .  amLODipine (NORVASC) 5 MG tablet, Take 1 tablet (5 mg total) by mouth daily. Marland Kitchen  atorvastatin (LIPITOR) 10 MG tablet, Take 1 tablet (10 mg total) by mouth daily. .  bumetanide (BUMEX) 2 MG tablet, TAKE 1 TABLET BY MOUTH TWICE DAILY  Current Outpatient Medications (Respiratory):  .  mometasone (NASONEX) 50 MCG/ACT nasal spray, Place 2 sprays into the nose daily. .  montelukast (SINGULAIR) 10 MG tablet, Take 1 tablet daily for Allergies .  promethazine-phenylephrine (PROMETHAZINE VC) 6.25-5 MG/5ML SYRP, Take 1 teaspoonful every 4 hours as needed for cough (Patient not taking: Reported on 12/18/2018)  Current Outpatient  Medications (Analgesics):  .  allopurinol (ZYLOPRIM) 300 MG tablet, TAKE 1 TABLET BY MOUTH ONCE DAILY FOR GOUT .  aspirin (ASPIRIN LOW DOSE) 81 MG tablet, Take 81 mg by mouth daily.   Current Outpatient Medications (Hematological):  Marland Kitchen  IRON PO, Take 18 mg by mouth daily.  Current Outpatient Medications (Other):  Marland Kitchen  ALPRAZolam (XANAX) 1 MG tablet, Take 1/2 to 1 tablet 2 to 3 x / day ONLY if needed for Anxiety Attacks & please try to limit to 5 days /week to avoid addiction .  blood glucose meter kit and supplies KIT, CHECK BLOOD SUGAR 1 TIME DAILY-E11.9 .  Cholecalciferol (VITAMIN D PO), Take 10,000 Units by mouth.  .  Lancets MISC, CHECK BLOOD SUGAR 1 TIME DAILY-E11.9 .  MAGNESIUM PO, Take 500 mg by mouth 2 (two) times daily.   Allergies: Allergies  Allergen Reactions  . Ace Inhibitors     Current Problems (verified) has History of colonic polyps; Type 2 diabetes mellitus ([place]); Hyperlipidemia associated with type 2 diabetes mellitus ([place]); Gout; Vitamin D deficiency; Essential hypertension; Medication management; H/O prostate cancer (2006); Morbid obesity (BMI 37.89); Hypertensive retinopathy; CKD stage 2 due to type 2 diabetes mellitus ([place]); Anxiety; and ACEI/ARB contraindicated on their problem list.  Screening Tests Immunization History  Administered Date(s) Administered  .  DT 03/21/2012  . Influenza-Unspecified 04/16/2014, 04/16/2015, 04/16/2018  . Pneumococcal Conjugate-13 05/19/2014  . Pneumococcal Polysaccharide-23 07/24/2007   Preventative care: Last colonoscopy: 2016 due 2023  Prior vaccinations: TD or Tdap: 2013  Influenza: 2019 Pneumococcal: 2009  Prevnar13: 2015 Shingles/Zostavax: declines  Names of Other Physician/Practitioners you currently use: 1. [facility] here for primary care 2. Dr. Rosana Hoes in [place] [facility]), eye doctor, last visit 01/22/2018 - report received and abstracted 3. Dr. Woodfin Ganja, dentist, overdue -  looking for dentist right now  Patient Care Team: [name], MD as PCP - General (Internal Medicine) Inda Castle, MD (Inactive) as Consulting Physician (Gastroenterology) Rana Snare, MD as Consulting Physician (Urology) Johnathan Hausen, MD as Consulting Physician (General Surgery)  Surgical: He  has a past surgical history that includes Appendectomy (1994); Cholecystectomy (1994); Knee surgery (1996); and colovesical fistula repair (01/2011). Family His family history is not on file. Social history  He reports that he quit smoking about 33 years ago. He has never used smokeless tobacco. He reports that he does not drink alcohol or use drugs.  MEDICARE WELLNESS OBJECTIVES: Physical activity: Current Exercise Habits: Home exercise routine, Type of exercise: walking, Time (Minutes): 10, Frequency (Times/Week): 3, Weekly Exercise (Minutes/Week): 30, Intensity: Mild, Exercise limited by: None identified Cardiac risk factors: Cardiac Risk Factors include: advanced age (>38mn, >>60women);male gender;diabetes mellitus;dyslipidemia;hypertension;sedentary lifestyle Depression/mood screen:   [facility]  Decreased Interest 0  Down, Depressed, Hopeless 0  PHQ - 2 Score 0    ADLs:  In your present state of health, do you have any difficulty performing the following activities: 12/18/2018 09/01/2018  Hearing? N N  Vision? N N  Difficulty concentrating or making decisions? N N  Walking or climbing stairs? N N  Comment uses handrails on stairs -  Dressing or bathing? N N  Doing errands, shopping? N N  Some recent data might be hidden     Cognitive Testing  Alert? Yes  Normal Appearance?Yes  Oriented to person? Yes  Place? Yes   Time? Yes  Recall of three objects?  Yes  Can perform simple calculations? Yes  Displays appropriate judgment?Yes  Can read the correct time from a watch face?Yes  EOL planning: Does Patient Have a Medical Advance Directive?:  Yes Type of Advance Directive: Healthcare Power of Attorney, Living will Does patient want to make changes to medical advance directive?: No - Patient declined Copy of HVeyoin Chart?: No - copy requested   Objective:   Today's Vitals   12/18/18 0922  BP: 128/74  Pulse: 78  Temp: 97.9 F (36.6 C)  SpO2: 99%  Weight: 248 lb 3.2 oz (112.6 kg)  Height: _0  (1.88 m)   Body mass index is 31.87 kg/m.  General appearance: alert, no distress, WD/WN, male HEENT: normocephalic, sclerae anicteric, TMs pearly, nares patent, no discharge or erythema, pharynx normal Oral cavity: MMM, no lesions Neck: supple, no lymphadenopathy, no thyromegaly, no masses Heart: RRR, normal S1, S2, no murmurs Lungs: CTA bilaterally, no wheezes, rhonchi, or rales Abdomen: +bs, soft, non tender, non distended, no masses, no hepatomegaly, no splenomegaly Musculoskeletal: nontender, no swelling, no obvious deformity Extremities: no edema, no cyanosis, no clubbing Pulses: 2+ symmetric, upper and lower extremities, normal cap refill Neurological: alert, oriented x 3, CN2-12 intact, strength normal upper extremities and lower extremities, sensation normal throughout, DTRs 2+ throughout, no cerebellar signs, gait normal Psychiatric: normal affect, behavior normal, pleasant  Medicare Attestation I have personally reviewed: The patient's medical and social history Their use of alcohol, tobacco or illicit drugs Their current medications and supplements The patient's functional ability including ADLs,fall risks, home safety risks, cognitive, and hearing and visual impairment Diet and physical activities Evidence for depression or mood disorders  The patient's weight, height, BMI, and visual acuity have been recorded in the chart.  I have made referrals, counseling, and provided education to the patient based on review of the above and I have provided the patient with a written personalized  care plan for preventive services.     Izora Ribas, NP   12/18/2018

## 2018-12-18 ENCOUNTER — Ambulatory Visit (INDEPENDENT_AMBULATORY_CARE_PROVIDER_SITE_OTHER): Payer: PPO | Admitting: Adult Health

## 2018-12-18 ENCOUNTER — Encounter: Payer: Self-pay | Admitting: Adult Health

## 2018-12-18 ENCOUNTER — Other Ambulatory Visit: Payer: Self-pay

## 2018-12-18 VITALS — BP 128/74 | HR 78 | Temp 97.9°F | Ht 74.0 in | Wt 248.2 lb

## 2018-12-18 DIAGNOSIS — R6889 Other general symptoms and signs: Secondary | ICD-10-CM

## 2018-12-18 DIAGNOSIS — Z0001 Encounter for general adult medical examination with abnormal findings: Secondary | ICD-10-CM

## 2018-12-18 DIAGNOSIS — E785 Hyperlipidemia, unspecified: Secondary | ICD-10-CM

## 2018-12-18 DIAGNOSIS — Z79899 Other long term (current) drug therapy: Secondary | ICD-10-CM | POA: Diagnosis not present

## 2018-12-18 DIAGNOSIS — Z8546 Personal history of malignant neoplasm of prostate: Secondary | ICD-10-CM

## 2018-12-18 DIAGNOSIS — E559 Vitamin D deficiency, unspecified: Secondary | ICD-10-CM | POA: Diagnosis not present

## 2018-12-18 DIAGNOSIS — H35033 Hypertensive retinopathy, bilateral: Secondary | ICD-10-CM | POA: Diagnosis not present

## 2018-12-18 DIAGNOSIS — E1169 Type 2 diabetes mellitus with other specified complication: Secondary | ICD-10-CM | POA: Diagnosis not present

## 2018-12-18 DIAGNOSIS — M1000 Idiopathic gout, unspecified site: Secondary | ICD-10-CM | POA: Diagnosis not present

## 2018-12-18 DIAGNOSIS — Z860100 Personal history of colon polyps, unspecified: Secondary | ICD-10-CM

## 2018-12-18 DIAGNOSIS — Z5309 Procedure and treatment not carried out because of other contraindication: Secondary | ICD-10-CM

## 2018-12-18 DIAGNOSIS — E1122 Type 2 diabetes mellitus with diabetic chronic kidney disease: Secondary | ICD-10-CM | POA: Diagnosis not present

## 2018-12-18 DIAGNOSIS — N182 Chronic kidney disease, stage 2 (mild): Secondary | ICD-10-CM

## 2018-12-18 DIAGNOSIS — I10 Essential (primary) hypertension: Secondary | ICD-10-CM | POA: Diagnosis not present

## 2018-12-18 DIAGNOSIS — F419 Anxiety disorder, unspecified: Secondary | ICD-10-CM

## 2018-12-18 DIAGNOSIS — Z0000 Encounter for general adult medical examination without abnormal findings: Secondary | ICD-10-CM

## 2018-12-18 NOTE — Patient Instructions (Addendum)
Marcus Chambers , Thank you for taking time to come for your Medicare Wellness Visit. I appreciate your ongoing commitment to your health goals. Please review the following plan we discussed and let me know if I can assist you in the future.   These are the goals we discussed: Goals    . Exercise 150 min/wk Moderate Activity    . LDL CALC < 70    . Weight (lb) < 230 lb (104.3 kg)       This is a list of the screening recommended for you and due dates:  Health Maintenance  Topic Date Due  . Eye exam for diabetics  01/23/2019  . Flu Shot  02/15/2019  . Hemoglobin A1C  03/03/2019  . Complete foot exam   09/02/2019  . Urine Protein Check  09/03/2019  . Colon Cancer Screening  07/21/2021  . Tetanus Vaccine  03/21/2022  . Pneumonia vaccines  Completed     Preventing High Cholesterol Cholesterol is a waxy, fat-like substance that your body needs in small amounts. Your liver makes all the cholesterol that your body needs. Having high cholesterol (hypercholesterolemia) increases your risk for heart disease and stroke. Extra (excess) cholesterol comes from the food you eat, such as animal-based fat (saturated fat) from meat and some dairy products. High cholesterol can often be prevented with diet and lifestyle changes. If you already have high cholesterol, you can control it with diet and lifestyle changes, as well as medicine. What nutrition changes can be made?  Eat less saturated fat. Foods that contain saturated fat include red meat and some dairy products.  Avoid processed meats, like bacon and lunch meats.  Avoid trans fats, which are found in margarine and some baked goods.  Avoid foods and beverages that have added sugars.  Eat more fruits, vegetables, and whole grains.  Choose healthy sources of protein, such as fish, poultry, and nuts.  Choose healthy sources of fat, such as: ? Nuts. ? Vegetable oils, especially olive oil. ? Fish that have healthy fats (omega-3 fatty acids),  such as mackerel or salmon. What lifestyle changes can be made?   Lose weight if you are overweight. Losing 5-10 lb (2.3-4.5 kg) can help prevent or control high cholesterol and reduce your risk for diabetes and high blood pressure. Ask your health care provider to help you with a diet and exercise plan to safely lose weight.  Get enough exercise. Do at least 150 minutes of moderate-intensity exercise each week. ? You could do this in short exercise sessions several times a day, or you could do longer exercise sessions a few times a week. For example, you could take a brisk 10-minute walk or bike ride, 3 times a day, for 5 days a week.  Do not smoke. If you need help quitting, ask your health care provider.  Limit your alcohol intake. If you drink alcohol, limit alcohol intake to no more than 1 drink a day for nonpregnant women and 2 drinks a day for men. One drink equals 12 oz of beer, 5 oz of wine, or 1 oz of hard liquor. Why are these changes important?  If you have high cholesterol, deposits (plaques) may build up on the walls of your blood vessels. Plaques make the arteries narrower and stiffer, which can restrict or block blood flow and cause blood clots to form. This greatly increases your risk for heart attack and stroke. Making diet and lifestyle changes can reduce your risk for these life-threatening conditions. What  can I do to lower my risk?  Manage your risk factors for high cholesterol. Talk with your health care provider about all of your risk factors and how to lower your risk.  Manage other conditions that you have, such as diabetes or high blood pressure (hypertension).  Have your cholesterol checked at regular intervals.  Keep all follow-up visits as told by your health care provider. This is important. How is this treated? In addition to diet and lifestyle changes, your health care provider may recommend medicines to help lower cholesterol, such as a medicine to reduce  the amount of cholesterol made in your liver. You may need medicine if:  Diet and lifestyle changes do not lower your cholesterol enough.  You have high cholesterol and other risk factors for heart disease or stroke. Take over-the-counter and prescription medicines only as told by your health care provider. Where to find more information  American Heart Association: ThisTune.com.pt.jsp  National Heart, Lung, and Blood Institute: FrenchToiletries.com.cy Summary  High cholesterol increases your risk for heart disease and stroke. By keeping your cholesterol level low, you can reduce your risk for these conditions.  Diet and lifestyle changes are the most important steps in preventing high cholesterol.  Work with your health care provider to manage your risk factors, and have your blood tested regularly. This information is not intended to replace advice given to you by your health care provider. Make sure you discuss any questions you have with your health care provider. Document Released: 07/18/2015 Document Revised: 03/11/2016 Document Reviewed: 03/11/2016 Elsevier Interactive Patient Education  2019 [employer].

## 2018-12-19 LAB — CBC WITH DIFFERENTIAL/PLATELET
Absolute Monocytes: 407 cells/uL (ref 200–950)
Basophils Absolute: 50 cells/uL (ref 0–200)
Basophils Relative: 1.2 %
Eosinophils Absolute: 88 cells/uL (ref 15–500)
Eosinophils Relative: 2.1 %
HCT: 36.8 % — ABNORMAL LOW (ref 38.5–50.0)
Hemoglobin: 12 g/dL — ABNORMAL LOW (ref 13.2–17.1)
Lymphs Abs: 895 cells/uL (ref 850–3900)
MCH: 27 pg (ref 27.0–33.0)
MCHC: 32.6 g/dL (ref 32.0–36.0)
MCV: 82.9 fL (ref 80.0–100.0)
MPV: 10.5 fL (ref 7.5–12.5)
Monocytes Relative: 9.7 %
Neutro Abs: 2759 cells/uL (ref 1500–7800)
Neutrophils Relative %: 65.7 %
Platelets: 242 10*3/uL (ref 140–400)
RBC: 4.44 10*6/uL (ref 4.20–5.80)
RDW: 14.7 % (ref 11.0–15.0)
Total Lymphocyte: 21.3 %
WBC: 4.2 10*3/uL (ref 3.8–10.8)

## 2018-12-19 LAB — HEMOGLOBIN A1C
Hgb A1c MFr Bld: 5.5 % of total Hgb (ref <5.7)
Mean Plasma Glucose: 111 (calc)
eAG (mmol/L): 6.2 (calc)

## 2018-12-19 LAB — COMPLETE METABOLIC PANEL WITH GFR
AG Ratio: 1.4 (calc) (ref 1.0–2.5)
ALT: 13 U/L (ref 9–46)
AST: 15 U/L (ref 10–35)
Albumin: 4.2 g/dL (ref 3.6–5.1)
Alkaline phosphatase (APISO): 60 U/L (ref 35–144)
BUN: 15 mg/dL (ref 7–25)
CO2: 25 mmol/L (ref 20–32)
Calcium: 10.4 mg/dL — ABNORMAL HIGH (ref 8.6–10.3)
Chloride: 104 mmol/L (ref 98–110)
Creat: 0.96 mg/dL (ref 0.70–1.18)
GFR, Est African American: 88 mL/min/{1.73_m2} (ref >=60)
GFR, Est Non African American: 76 mL/min/{1.73_m2} (ref >=60)
Globulin: 3.1 g/dL (calc) (ref 1.9–3.7)
Glucose, Bld: 91 mg/dL (ref 65–99)
Potassium: 4.1 mmol/L (ref 3.5–5.3)
Sodium: 138 mmol/L (ref 135–146)
Total Bilirubin: 0.5 mg/dL (ref 0.2–1.2)
Total Protein: 7.3 g/dL (ref 6.1–8.1)

## 2018-12-19 LAB — TSH: TSH: 0.74 mIU/L (ref 0.40–4.50)

## 2018-12-19 LAB — LIPID PANEL
Cholesterol: 157 mg/dL (ref <200)
HDL: 74 mg/dL (ref >=40)
LDL Cholesterol (Calc): 70 mg/dL (calc)
Non-HDL Cholesterol (Calc): 83 mg/dL (calc) (ref <130)
Total CHOL/HDL Ratio: 2.1 (calc) (ref <5.0)
Triglycerides: 45 mg/dL (ref <150)

## 2018-12-19 LAB — MAGNESIUM: Magnesium: 1.6 mg/dL (ref 1.5–2.5)

## 2018-12-31 ENCOUNTER — Other Ambulatory Visit: Payer: Self-pay | Admitting: Internal Medicine

## 2018-12-31 DIAGNOSIS — F411 Generalized anxiety disorder: Secondary | ICD-10-CM

## 2018-12-31 MED ORDER — ALPRAZOLAM 1 MG PO TABS: ORAL_TABLET | ORAL | 0 refills | Status: DC

## 2019-01-07 ENCOUNTER — Telehealth: Payer: Self-pay

## 2019-01-07 DIAGNOSIS — E1122 Type 2 diabetes mellitus with diabetic chronic kidney disease: Secondary | ICD-10-CM

## 2019-01-07 MED ORDER — METFORMIN HCL 500 MG PO TABS: ORAL_TABLET | ORAL | 1 refills | Status: DC

## 2019-01-07 NOTE — Addendum Note (Signed)
Addended by: Chancy Hurter on: 01/07/2019 04:13 PM   Modules accepted: Orders

## 2019-01-07 NOTE — Telephone Encounter (Signed)
Patient received a letter from his pharmacy regarding Metformin. Patient only has two tablets left and would like to know what to do. Letter states to stop the medication. Please advise.

## 2019-01-07 NOTE — Telephone Encounter (Signed)
Patient aware and new Rx sent in.

## 2019-01-27 ENCOUNTER — Other Ambulatory Visit: Payer: Self-pay

## 2019-01-27 DIAGNOSIS — N182 Chronic kidney disease, stage 2 (mild): Secondary | ICD-10-CM

## 2019-01-27 DIAGNOSIS — E1122 Type 2 diabetes mellitus with diabetic chronic kidney disease: Secondary | ICD-10-CM

## 2019-01-27 MED ORDER — ONETOUCH ULTRA VI STRP: ORAL_STRIP | 12 refills | Status: DC

## 2019-01-27 NOTE — Telephone Encounter (Signed)
Refill request for OneTouch Ultra 2 test strips.

## 2019-02-10 ENCOUNTER — Telehealth: Payer: Self-pay | Admitting: *Deleted

## 2019-02-10 NOTE — Telephone Encounter (Signed)
Patient called and pulled a mscle in his back.  Per Dr Melford Aase, he can try Ibuprofen OTC and take 2 to 3 tablets 4 times a day, with food.  Patient is aware.

## 2019-02-20 ENCOUNTER — Other Ambulatory Visit: Payer: Self-pay | Admitting: Adult Health

## 2019-02-20 ENCOUNTER — Telehealth: Payer: Self-pay

## 2019-02-20 MED ORDER — AZELASTINE HCL 0.1 % NA SOLN: 2.0000 | Freq: Two times a day (BID) | NASAL | 2 refills | Status: DC

## 2019-02-20 MED ORDER — MOMETASONE FUROATE 50 MCG/ACT NA SUSP: 1.0000 | Freq: Two times a day (BID) | NASAL | 2 refills | Status: AC | PRN

## 2019-02-20 NOTE — Telephone Encounter (Signed)
Patient states that the nasal spray that he has been using is not working. Requesting something new. Please advise.

## 2019-02-20 NOTE — Telephone Encounter (Signed)
Left message to call back  

## 2019-02-20 NOTE — Telephone Encounter (Signed)
Mometasone is what he is currently taking and not working successfully. Provider aware and another prescription for Astelin being sent in.

## 2019-03-04 ENCOUNTER — Other Ambulatory Visit: Payer: Self-pay | Admitting: Adult Health

## 2019-03-04 DIAGNOSIS — N182 Chronic kidney disease, stage 2 (mild): Secondary | ICD-10-CM

## 2019-03-04 DIAGNOSIS — E1122 Type 2 diabetes mellitus with diabetic chronic kidney disease: Secondary | ICD-10-CM

## 2019-03-13 ENCOUNTER — Ambulatory Visit: Payer: Self-pay | Admitting: Internal Medicine

## 2019-03-26 NOTE — Patient Instructions (Addendum)
Your blood pressure decreased to 140/70.  Continue to check this at home.  Goal for you is 130/80 or less.  If you have readings that are consistently higher please contact the office.   Monitor your salt intake.  Less than 1,200mg  of sodium a day.  Salt can increase your blood pressure and cause you to retain fluid or have swelling.  Good working drinking water but please increase the amount you drink daily.  60-80 ounces daily.  Start by increasing one bottle at a time to make it part of your daily routine.      Vit D  & Vit C 1,000 mg   are recommended to help protect  against the Covid-19 and other Corona viruses.    Also it's recommended  to take  Zinc 50 mg  to help  protect against the Covid-19   and best place to get  is also on [email or web address]  and don't pay more than 6-8 cents /pill !  ================================ Coronavirus (COVID-19) Are you at risk?  Are you at risk for the Coronavirus (COVID-19)?  To be considered HIGH RISK for Coronavirus (COVID-19), you have to meet the following criteria:  . Traveled to [place], [place], [place], [place] or [place]; or in the [place] to [place], [place], [place]  . or [place]; and have fever, cough, and shortness of breath within the last 2 weeks of travel OR . Been in close contact with a person diagnosed with COVID-19 within the last 2 weeks and have  . fever, cough,and shortness of breath .  . IF YOU DO NOT MEET THESE CRITERIA, YOU ARE CONSIDERED LOW RISK FOR COVID-19.  What to do if you are HIGH RISK for COVID-19?  Marland Kitchen If you are having a medical emergency, call 911. . Seek medical care right away. Before you go to a doctor's office, urgent care or emergency department, .  call ahead and tell them about your recent travel, contact with someone diagnosed with COVID-19  .  and your symptoms.  . You should receive instructions from your physician's office regarding next steps of care.  . When you arrive at  healthcare provider, tell the healthcare staff immediately you have returned from  . visiting [place], [place], [place], [place] or [place]; or traveled in the [place] to [place], [place],  . [place] or [place] in the last two weeks or you have been in close contact with a person diagnosed with  . COVID-19 in the last 2 weeks.   . Tell the health care staff about your symptoms: fever, cough and shortness of breath. . After you have been seen by a medical provider, you will be either: o Tested for (COVID-19) and discharged home on quarantine except to seek medical care if  o symptoms worsen, and asked to  - Stay home and avoid contact with others until you get your results (4-5 days)  - Avoid travel on public transportation if possible (such as bus, train, or airplane) or o Sent to the Emergency Department by EMS for evaluation, COVID-19 testing  and  o possible admission depending on your condition and test results.  What to do if you are LOW RISK for COVID-19?  Reduce your risk of any infection by using the same precautions used for avoiding the common cold or flu:  Marland Kitchen Wash your hands often with soap and warm water for at least 20 seconds.  If soap and water are not readily  available,  . use an alcohol-based hand sanitizer with at least 60% alcohol.  . If coughing or sneezing, cover your mouth and nose by coughing or sneezing into the elbow areas of your shirt or coat, .  into a tissue or into your sleeve (not your hands). . Avoid shaking hands with others and consider head nods or verbal greetings only. . Avoid touching your eyes, nose, or mouth with unwashed hands.  . Avoid close contact with people who are sick. . Avoid places or events with large numbers of people in one location, like concerts or sporting events. . Carefully consider travel plans you have or are making. . If you are planning any travel outside or inside the [place], visit the CDC's Travelers' Health webpage for  the latest health notices. . If you have some symptoms but not all symptoms, continue to monitor at home and seek medical attention  . if your symptoms worsen. . If you are having a medical emergency, call 911. >>>>>>>>>>>>>>>>>>>>>>>>>>>>>>>>>>>>>>>>>>>>>>>>>>>>>>> We Do NOT Approve of  [facility] [place] Soliciting Our Patients  To Do Home Visits  & We Do NOT Approve of [employer] > > > > > > > > > > > > > > > > > > > > > > > > > > > > > > > > > > >  > > > >   Preventive Care for Adults  A healthy lifestyle and preventive care can promote health and wellness. Preventive health guidelines for men include the following key practices:  A routine yearly physical is a good way to check with your health care provider about your health and preventative screening. It is a chance to share any concerns and updates on your health and to receive a thorough exam.  Visit your dentist for a routine exam and preventative care every 6 months. Brush your teeth twice a day and floss once a day. Good oral hygiene prevents tooth decay and gum disease.  The frequency of eye exams is based on your age, health, family medical history, use of contact lenses, and other factors. Follow your health care provider's recommendations for frequency of eye exams.  Eat a healthy diet. Foods such as vegetables, fruits, whole grains, low-fat dairy products, and lean protein foods contain the nutrients you need without too many calories. Decrease your intake of foods high in solid fats, added sugars, and salt. Eat the right amount of calories for you. Get information about a proper diet from your health care provider, if necessary.  Regular physical exercise is one of the most important things you can do for your health. Most adults should get at least 150 minutes of moderate-intensity exercise (any activity that increases your heart rate and causes you to sweat) each week. In addition, most adults need  muscle-strengthening exercises on 2 or more days a week.  Maintain a healthy weight. The body mass index (BMI) is a screening tool to identify possible weight problems. It provides an estimate of body fat based on height and weight. Your health care provider can find your BMI and can help you achieve or maintain a healthy weight. For adults 20 years and older:  A BMI below 18.5 is considered underweight.  A BMI of 18.5 to 24.9 is normal.  A BMI of 25 to 29.9 is considered overweight.  A BMI of 30 and above is considered obese.  Maintain normal blood lipids and cholesterol levels by exercising and minimizing your intake of  saturated fat. Eat a balanced diet with plenty of fruit and vegetables. Blood tests for lipids and cholesterol should begin at age 49 and be repeated every 5 years. If your lipid or cholesterol levels are high, you are over 50, or you are at high risk for heart disease, you may need your cholesterol levels checked more frequently. Ongoing high lipid and cholesterol levels should be treated with medicines if diet and exercise are not working.  If you smoke, find out from your health care provider how to quit. If you do not use tobacco, do not start.  Lung cancer screening is recommended for adults aged 51-80 years who are at high risk for developing lung cancer because of a history of smoking. A yearly low-dose CT scan of the lungs is recommended for people who have at least a 30-pack-year history of smoking and are a current smoker or have quit within the past 15 years. A pack year of smoking is smoking an average of 1 pack of cigarettes a day for 1 year (for example: 1 pack a day for 30 years or 2 packs a day for 15 years). Yearly screening should continue until the smoker has stopped smoking for at least 15 years. Yearly screening should be stopped for people who develop a health problem that would prevent them from having lung cancer treatment.  If you choose to drink alcohol,  do not have more than 2 drinks per day. One drink is considered to be 12 ounces (355 mL) of beer, 5 ounces (148 mL) of wine, or 1.5 ounces (44 mL) of liquor.  Avoid use of street drugs. Do not share needles with anyone. Ask for help if you need support or instructions about stopping the use of drugs.  High blood pressure causes heart disease and increases the risk of stroke. Your blood pressure should be checked at least every 1-2 years. Ongoing high blood pressure should be treated with medicines, if weight loss and exercise are not effective.  If you are 81-10 years old, ask your health care provider if you should take aspirin to prevent heart disease.  Diabetes screening involves taking a blood sample to check your fasting blood sugar level. Testing should be considered at a younger age or be carried out more frequently if you are overweight and have at least 1 risk factor for diabetes.  Colorectal cancer can be detected and often prevented. Most routine colorectal cancer screening begins at the age of 38 and continues through age 60. However, your health care provider may recommend screening at an earlier age if you have risk factors for colon cancer. On a yearly basis, your health care provider may provide home test kits to check for hidden blood in the stool. Use of a small camera at the end of a tube to directly examine the colon (sigmoidoscopy or colonoscopy) can detect the earliest forms of colorectal cancer. Talk to your health care provider about this at age 63, when routine screening begins. Direct exam of the colon should be repeated every 5-10 years through age 28, unless early forms of precancerous polyps or small growths are found.  Hepatitis C blood testing is recommended for all people born from [birth year] through 1965 and any individual with known risks for hepatitis C.  Screening for abdominal aortic aneurysm (AAA)  by ultrasound is recommended for people who have history of high blood  pressure or who are current or former smokers.  Healthy men should  receive prostate-specific antigen (PSA)  blood tests as part of routine cancer screening. Talk with your health care provider about prostate cancer screening.  Testicular cancer screening is  recommended for adult males. Screening includes self-exam, a health care provider exam, and other screening tests. Consult with your health care provider about any symptoms you have or any concerns you have about testicular cancer.  Use sunscreen. Apply sunscreen liberally and repeatedly throughout the day. You should seek shade when your shadow is shorter than you. Protect yourself by wearing long sleeves, pants, a wide-brimmed hat, and sunglasses year round, whenever you are outdoors.  Once a month, do a whole-body skin exam, using a mirror to look at the skin on your back. Tell your health care provider about new moles, moles that have irregular borders, moles that are larger than a pencil eraser, or moles that have changed in shape or color.  Stay current with required vaccines (immunizations).  Influenza vaccine. All adults should be immunized every year.  Tetanus, diphtheria, and acellular pertussis (Td, Tdap) vaccine. An adult who has not previously received Tdap or who does not know his vaccine status should receive 1 dose of Tdap. This initial dose should be followed by tetanus and diphtheria toxoids (Td) booster doses every 10 years. Adults with an unknown or incomplete history of completing a 3-dose immunization series with Td-containing vaccines should begin or complete a primary immunization series including a Tdap dose. Adults should receive a Td booster every 10 years.  Zoster vaccine. One dose is recommended for adults aged 51 years or older unless certain conditions are present.    PREVNAR - Pneumococcal 13-valent conjugate (PCV13) vaccine. When indicated, a person who is uncertain of his immunization history and has no  record of immunization should receive the PCV13 vaccine. An adult aged 22 years or older who has certain medical conditions and has not been previously immunized should receive 1 dose of PCV13 vaccine. This PCV13 should be followed with a dose of pneumococcal polysaccharide (PPSV23) vaccine. The PPSV23 vaccine dose should be obtained 1 or more year(s)after the dose of PCV13 vaccine. An adult aged 78 years or older who has certain medical conditions and previously received 1 or more doses of PPSV23 vaccine should receive 1 dose of PCV13. The PCV13 vaccine dose should be obtained 1 or more years after the last PPSV23 vaccine dose.    PNEUMOVAX - Pneumococcal polysaccharide (PPSV23) vaccine. When PCV13 is also indicated, PCV13 should be obtained first. All adults aged 80 years and older should be immunized. An adult younger than age 38 years who has certain medical conditions should be immunized. Any person who resides in a nursing home or long-term care facility should be immunized. An adult smoker should be immunized. People with an immunocompromised condition and certain other conditions should receive both PCV13 and PPSV23 vaccines. People with human immunodeficiency virus (HIV) infection should be immunized as soon as possible after diagnosis. Immunization during chemotherapy or radiation therapy should be avoided. Routine use of PPSV23 vaccine is not recommended for American Indians, [place] Natives, or people younger than 65 years unless there are medical conditions that require PPSV23 vaccine. When indicated, people who have unknown immunization and have no record of immunization should receive PPSV23 vaccine. One-time revaccination 5 years after the first dose of PPSV23 is recommended for people aged 19-64 years who have chronic kidney failure, nephrotic syndrome, asplenia, or immunocompromised conditions. People who received 1-2 doses of PPSV23 before age 71 years should receive another dose of PPSV23  vaccine  at age 43 years or later if at least 5 years have passed since the previous dose. Doses of PPSV23 are not needed for people immunized with PPSV23 at or after age 65 years.    Hepatitis A vaccine. Adults who wish to be protected from this disease, have certain high-risk conditions, work with hepatitis A-infected animals, work in hepatitis A research labs, or travel to or work in countries with a high rate of hepatitis A should be immunized. Adults who were previously unvaccinated and who anticipate close contact with an international adoptee during the first 60 days after arrival in the [place] from a country with a high rate of hepatitis A should be immunized.    Hepatitis B vaccine. Adults should be immunized if they wish to be protected from this disease, have certain high-risk conditions, may be exposed to blood or other infectious body fluids, are household contacts or sex partners of hepatitis B positive people, are clients or workers in certain care facilities, or travel to or work in countries with a high rate of hepatitis B.   Preventive Service / Frequency   Ages 73 and over  Blood pressure check.  Lipid and cholesterol check.  Lung cancer screening. / Every year if you are aged 56-80 years and have a 30-pack-year history of smoking and currently smoke or have quit within the past 15 years. Yearly screening is stopped once you have quit smoking for at least 15 years or develop a health problem that would prevent you from having lung cancer treatment.  Fecal occult blood test (FOBT) of stool. You may not have to do this test if you get a colonoscopy every 10 years.  Flexible sigmoidoscopy** or colonoscopy.** / Every 5 years for a flexible sigmoidoscopy or every 10 years for a colonoscopy beginning at age 49 and continuing until age 53.  Hepatitis C blood test.** / For all people born from [birth year] through 1965 and any individual with known risks for hepatitis  C.  Abdominal aortic aneurysm (AAA) screening./ Screening current or former smokers or have Hypertension.  Skin self-exam. / Monthly.  Influenza vaccine. / Every year.  Tetanus, diphtheria, and acellular pertussis (Tdap/Td) vaccine.** / 1 dose of Td every 10 years.   Zoster vaccine.** / 1 dose for adults aged 12 years or older.         Pneumococcal 13-valent conjugate (PCV13) vaccine.    Pneumococcal polysaccharide (PPSV23) vaccine.     Hepatitis A vaccine.** / Consult your health care provider.  Hepatitis B vaccine.** / Consult your health care provider. Screening for abdominal aortic aneurysm (AAA)  by ultrasound is recommended for people who have history of high blood pressure or who are current or former smokers. ++++++++++ Recommend Adult Low Dose Aspirin or  coated  Aspirin 81 mg daily  To reduce risk of Colon Cancer 40 %,  Skin Cancer 26 % ,  Malignant Melanoma 46%  and  Pancreatic cancer 60% ++++++++++++++++++++++ Vitamin D goal  is between 70-100.  Please make sure that you are taking your Vitamin D as directed.  It is very important as a natural anti-inflammatory  helping hair, skin, and nails, as well as reducing stroke and heart attack risk.  It helps your bones and helps with mood. It also decreases numerous cancer risks so please take it as directed.  Low Vit D is associated with a 200-300% higher risk for CANCER  and 200-300% higher risk for HEART   ATTACK  &  STROKE.   .....................................Marland Kitchen  It is also associated with higher death rate at younger ages,  autoimmune diseases like Rheumatoid arthritis, Lupus, Multiple Sclerosis.    Also many other serious conditions, like depression, Alzheimer's Dementia, infertility, muscle aches, fatigue, fibromyalgia - just to name a few. ++++++++++++++++++++++ Recommend the book "The END of DIETING" by Dr Excell Seltzer  & the book "The END of DIABETES " by Dr Excell Seltzer At [email or web address] - get book &  Audio CD's    Being diabetic has a  300% increased risk for heart attack, stroke, cancer, and alzheimer- type vascular dementia. It is very important that you work harder with diet by avoiding all foods that are white. Avoid white rice (brown & wild rice is OK), white potatoes (sweetpotatoes in moderation is OK), White bread or wheat bread or anything made out of white flour like bagels, donuts, rolls, buns, biscuits, cakes, pastries, cookies, pizza crust, and pasta (made from white flour & egg whites) - vegetarian pasta or spinach or wheat pasta is OK. Multigrain breads like Arnold's or [employer], or multigrain sandwich thins or flatbreads.  Diet, exercise and weight loss can reverse and cure diabetes in the early stages.  Diet, exercise and weight loss is very important in the control and prevention of complications of diabetes which affects every system in your body, ie. Brain - dementia/stroke, eyes - glaucoma/blindness, heart - heart attack/heart failure, kidneys - dialysis, stomach - gastric paralysis, intestines - malabsorption, nerves - severe painful neuritis, circulation - gangrene & loss of a leg(s), and finally cancer and Alzheimers.    I recommend avoid fried & greasy foods,  sweets/candy, white rice (brown or wild rice or Quinoa is OK), white potatoes (sweet potatoes are OK) - anything made from white flour - bagels, doughnuts, rolls, buns, biscuits,white and wheat breads, pizza crust and traditional pasta made of white flour & egg white(vegetarian pasta or spinach or wheat pasta is OK).  Multi-grain bread is OK - like multi-grain flat bread or sandwich thins. Avoid alcohol in excess. Exercise is also important.    Eat all the vegetables you want - avoid meat, especially red meat and dairy - especially cheese.  Cheese is the most concentrated form of trans-fats which is the worst thing to clog up our arteries. Veggie cheese is OK which can be found in the fresh produce section at  [employer] or [employer] or [employer]  ++++++++++++++++++++++ DASH Eating Plan  DASH stands for "Dietary Approaches to Stop Hypertension."   The DASH eating plan is a healthy eating plan that has been shown to reduce high blood pressure (hypertension). Additional health benefits may include reducing the risk of type 2 diabetes mellitus, heart disease, and stroke. The DASH eating plan may also help with weight loss. WHAT DO I NEED TO KNOW ABOUT THE DASH EATING PLAN? For the DASH eating plan, you will follow these general guidelines:  Choose foods with a percent daily value for sodium of less than 5% (as listed on the food label).  Use salt-free seasonings or herbs instead of table salt or sea salt.  Check with your health care provider or pharmacist before using salt substitutes.  Eat lower-sodium products, often labeled as "lower sodium" or "no salt added."  Eat fresh foods.  Eat more vegetables, fruits, and low-fat dairy products.  Choose whole grains. Look for the word "whole" as the first word in the ingredient list.  Choose fish   Limit sweets, desserts, sugars, and sugary drinks.  Choose heart-healthy fats.  Eat veggie cheese   Eat more home-cooked food and less restaurant, buffet, and fast food.  Limit fried foods.  Cook foods using methods other than frying.  Limit canned vegetables. If you do use them, rinse them well to decrease the sodium.  When eating at a restaurant, ask that your food be prepared with less salt, or no salt if possible.                      WHAT FOODS CAN I EAT? Read Dr Fara Olden Fuhrman's books on The End of Dieting & The End of Diabetes  Grains Whole grain or whole wheat bread. Brown rice. Whole grain or whole wheat pasta. Quinoa, bulgur, and whole grain cereals. Low-sodium cereals. Corn or whole wheat flour tortillas. Whole grain cornbread. Whole grain crackers. Low-sodium crackers.  Vegetables Fresh or frozen vegetables (raw, steamed,  roasted, or grilled). Low-sodium or reduced-sodium tomato and vegetable juices. Low-sodium or reduced-sodium tomato sauce and paste. Low-sodium or reduced-sodium canned vegetables.   Fruits All fresh, canned (in natural juice), or frozen fruits.  Protein Products  All fish and seafood.  Dried beans, peas, or lentils. Unsalted nuts and seeds. Unsalted canned beans.  Dairy Low-fat dairy products, such as skim or 1% milk, 2% or reduced-fat cheeses, low-fat ricotta or cottage cheese, or plain low-fat yogurt. Low-sodium or reduced-sodium cheeses.  Fats and Oils Tub margarines without trans fats. Light or reduced-fat mayonnaise and salad dressings (reduced sodium). Avocado. Safflower, olive, or canola oils. Natural peanut or almond butter.  Other Unsalted popcorn and pretzels. The items listed above may not be a complete list of recommended foods or beverages. Contact your dietitian for more options.  ++++++++++++++++++++  WHAT FOODS ARE NOT RECOMMENDED? Grains/ White flour or wheat flour White bread. White pasta. White rice. Refined cornbread. Bagels and croissants. Crackers that contain trans fat.  Vegetables  Creamed or fried vegetables. Vegetables in a . Regular canned vegetables. Regular canned tomato sauce and paste. Regular tomato and vegetable juices.  Fruits Dried fruits. Canned fruit in light or heavy syrup. Fruit juice.  Meat and Other Protein Products Meat in general - RED meat & White meat.  Fatty cuts of meat. Ribs, chicken wings, all processed meats as bacon, sausage, bologna, salami, fatback, hot dogs, bratwurst and packaged luncheon meats.  Dairy Whole or 2% milk, cream, half-and-half, and cream cheese. Whole-fat or sweetened yogurt. Full-fat cheeses or blue cheese. Non-dairy creamers and whipped toppings. Processed cheese, cheese spreads, or cheese curds.  Condiments Onion and garlic salt, seasoned salt, table salt, and sea salt. Canned and packaged gravies.  Worcestershire sauce. Tartar sauce. Barbecue sauce. Teriyaki sauce. Soy sauce, including reduced sodium. Steak sauce. Fish sauce. Oyster sauce. Cocktail sauce. Horseradish. Ketchup and mustard. Meat flavorings and tenderizers. Bouillon cubes. Hot sauce. Tabasco sauce. Marinades. Taco seasonings. Relishes.  Fats and Oils Butter, stick margarine, lard, shortening and bacon fat. Coconut, palm kernel, or palm oils. Regular salad dressings.  Pickles and olives. Salted popcorn and pretzels.  The items listed above may not be a complete list of foods and beverages to avoid.

## 2019-03-26 NOTE — Progress Notes (Signed)
3 Month Follow Up   Assessment and Plan:    Yuriy was seen today for follow-up.  Diagnoses and all orders for this visit:  Essential hypertension Continue medication: Norvasc 89m, Bumex 244mdaily Monitor blood pressure at home; call if consistently over 130/80 Continue DASH diet.  Discussed decreasing salt in diet and increasing water intake. Reminder to go to the ER if any CP, SOB, nausea, dizziness, severe HA, changes vision/speech, left arm numbness and tingling and jaw pain.-     CBC with Differential/Platelet -     COMPLETE METABOLIC PANEL WITH GFR -     Magnesium  Hyperlipidemia associated with type 2 diabetes mellitus ([place] medication: Lipitor 1082mightly Continue low cholesterol diet and exercise.  -     Lipid panel  Type 2 diabetes mellitus with stage 2 chronic kidney disease, without long-term current use of insulin (HCC) Continue medications: Metformin 500m32mwo tablets twice a day. Discussed dietary and exercise modifications Perform daily foot/skin check, notify office of any concerning changes.  Check A1C  Vitamin D deficiency Continue supplementation -     VITAMIN D 25 Hydroxy (Vit-D Deficiency)  CKD stage 2 due to type 2 diabetes mellitus (HCC) Increase fluids  Avoid NSAIDS Blood pressure control Monitor sugars  Will continue to monitor  Morbid obesity (BMI 37.89)  Class 2 severe obesity due to excess calories with serious comorbidity and body mass index (BMI) of 37.0 to 37.9 in adult ([facility]CC[facility] Centers[facility]ussed dietary and exercise modifications  Anxiety Taking alprazolam, half-one tablet PRN Continue with benefit  Idiopathic gout, unspecified chronicity, unspecified site Taking Allopurinal 300mg69mly Continue with beenfit  Medication management Continued  Need for immunization against influenza -Influenza vaccine HD, received today  Allergic Rhinitis: Doing well at this time Using Singular and Nasonex PRN  Continue diet and meds as  discussed. Further disposition pending results of labs. Discussed med's effects and SE's.  Patient agrees with plan of care and opportunity to ask questions/voice concerns. Over 30 minutes of chart review, interview, exam, counseling, and critical decision making was performed.   Future Appointments  Date Time Provider [place]20[place]  9:00 AM McKeoUnk PintoGAAM-[facility] None  12/25/2019 10:00 AM CorbeLiane ComberGAAM-GAAIM None    ----------------------------------------------------------------------------------------------------------------------  HPI 77 y.70 male  presents for 3 month follow up on HTN, HLD, DM with CKD, gout, seasonal allergies, weight and vitamin D deficiency.   BMI is Body mass index is 32.97 kg/m., he has been working on diet and exercise. Wt Readings from Last 3 Encounters:  03/27/19 256 lb 12.8 oz (116.5 kg)  12/18/18 248 lb 3.2 oz (112.6 kg)  09/02/18 248 lb 9.6 oz (112.8 kg)    His blood pressure has been controlled at home, today their BP is BP: (!) 160/90  He does workout. He denies any cardiac symptoms, chest pains, palpitations, shortness of breath, dizziness or lower extremity edema.     He is on cholesterol medication Rosuvastatin and denies myalgias. His cholesterol is at goal. The cholesterol last visit was:   Lab Results  Component Value Date   CHOL 157 12/18/2018   HDL 74 12/18/2018   LDLCALC 70 12/18/2018   TRIG 45 12/18/2018   CHOLHDL 2.1 12/18/2018    He has been working on diet and exercise for prediabetes, and denies nausea, paresthesia of the feet, polydipsia, polyuria, visual disturbances, vomiting and weight loss. Last A1C in the office was:  Lab Results  Component Value Date   HGBA1C  5.5 12/18/2018   Patient is on Vitamin D supplement.   Lab Results  Component Value Date   VD25OH 67 09/02/2018       Current Medications:  Current Outpatient Medications on File Prior to Visit  Medication Sig  . allopurinol  (ZYLOPRIM) 300 MG tablet TAKE 1 TABLET BY MOUTH ONCE DAILY FOR GOUT  . ALPRAZolam (XANAX) 1 MG tablet Take 1/2 to 1 tab 2 to 3 x / day ONLY if needed for Anxiety &  limit to 5 days /week to avoid addiction (last 6 weeks til July 28)  . amLODipine (NORVASC) 5 MG tablet Take 1 tablet (5 mg total) by mouth daily.  Marland Kitchen aspirin (ASPIRIN LOW DOSE) 81 MG tablet Take 81 mg by mouth daily.   Marland Kitchen atorvastatin (LIPITOR) 10 MG tablet Take 1 tablet (10 mg total) by mouth daily.  Marland Kitchen azelastine (ASTELIN) 0.1 % nasal spray Place 2 sprays into both nostrils 2 (two) times daily. Use in each nostril as directed  . blood glucose meter kit and supplies KIT CHECK BLOOD SUGAR 1 TIME DAILY-E11.9  . bumetanide (BUMEX) 2 MG tablet TAKE 1 TABLET BY MOUTH TWICE DAILY  . Cholecalciferol (VITAMIN D PO) Take 10,000 Units by mouth.   Marland Kitchen glucose blood (ONETOUCH ULTRA) test strip Test blood sugar once daily  . IRON PO Take 18 mg by mouth daily.  . Lancets MISC CHECK BLOOD SUGAR 1 TIME DAILY-E11.9  . MAGNESIUM PO Take 500 mg by mouth 2 (two) times daily.   . metFORMIN (GLUCOPHAGE) 500 MG tablet Take 2 tablets by mouth twice daily  . mometasone (NASONEX) 50 MCG/ACT nasal spray Place 1 spray into the nose 2 (two) times daily as needed. 1 spray into each nostril.  . montelukast (SINGULAIR) 10 MG tablet Take 1 tablet daily for Allergies  . promethazine-phenylephrine (PROMETHAZINE VC) 6.25-5 MG/5ML SYRP Take 1 teaspoonful every 4 hours as needed for cough (Patient not taking: Reported on 12/18/2018)   No current facility-administered medications on file prior to visit.     Allergies:  Allergies  Allergen Reactions  . Ace Inhibitors      Medical History:  Past Medical History:  Diagnosis Date  . Adenomatous colon polyp    2010  . Gout   . Hyperlipidemia   . Hypertension   . Prostate cancer ([place]) 2010   treated by external beam radiation  . Type II or unspecified type diabetes mellitus without mention of complication, not stated  as uncontrolled   . Vitamin D deficiency     Family history- Reviewed and unchanged   Social history- Reviewed and unchanged   Names of Other Physician/Practitioners you currently use: 1. [facility] here for primary care 2. Eye Exam: Due for 2020 3. Dental Exam Past Due  Patient Care Team: [name], MD as PCP - General (Internal Medicine) Inda Castle, MD (Inactive) as Consulting Physician (Gastroenterology) Rana Snare, MD as Consulting Physician (Urology) Johnathan Hausen, MD as Consulting Physician (General Surgery)   Screening Tests: Immunization History  Administered Date(s) Administered  . DT 03/21/2012  . Influenza-Unspecified 04/16/2014, 04/16/2015, 04/16/2018  . Pneumococcal Conjugate-13 05/19/2014  . Pneumococcal Polysaccharide-23 07/24/2007     Vaccinations: TD or Tdap: 2013  Influenza: Received today Pneumococcal: 2009 Prevnar13: 2015 Shingles: Zostavax/Shingrix: Discussed with patient   Preventative Care: Last colonoscopy: 2016   Imaging: Chest X-ray: 11/2010 EKG: 08/2018     Review of Systems:  Review of Systems  Constitutional: Negative for chills, diaphoresis,  fever, malaise/fatigue and weight loss.  HENT: Negative for congestion, ear discharge, ear pain, hearing loss, nosebleeds, sinus pain, sore throat and tinnitus.   Eyes: Negative for blurred vision, double vision, photophobia, pain, discharge and redness.  Respiratory: Negative for cough, hemoptysis, sputum production, shortness of breath, wheezing and stridor.   Cardiovascular: Negative for chest pain, palpitations, orthopnea, claudication, leg swelling and PND.  Gastrointestinal: Negative for abdominal pain, blood in stool, constipation, diarrhea, heartburn, melena, nausea and vomiting.  Genitourinary: Negative for dysuria, flank pain, frequency, hematuria and urgency.  Musculoskeletal: Negative for back pain, falls, joint pain,  myalgias and neck pain.  Skin: Negative for itching and rash.  Neurological: Negative for dizziness, tingling, tremors, sensory change, speech change, focal weakness, seizures, loss of consciousness, weakness and headaches.  Endo/Heme/Allergies: Negative for environmental allergies and polydipsia. Does not bruise/bleed easily.  Psychiatric/Behavioral: Negative for depression, hallucinations, memory loss, substance abuse and suicidal ideas. The patient is not nervous/anxious and does not have insomnia.       Physical Exam: BP (!) 160/90   Pulse 60   Temp (!) 97.5 F (36.4 C)   Ht '6\' 2"'  (1.88 m)   Wt 256 lb 12.8 oz (116.5 kg)   SpO2 98%   BMI 32.97 kg/m  Wt Readings from Last 3 Encounters:  03/27/19 256 lb 12.8 oz (116.5 kg)  12/18/18 248 lb 3.2 oz (112.6 kg)  09/02/18 248 lb 9.6 oz (112.8 kg)   General Appearance: Well nourished, in no apparent distress. Eyes: PERRLA, EOMs, conjunctiva no swelling or erythema Sinuses: No Frontal/maxillary tenderness ENT/Mouth: Ext aud canals clear, TMs without erythema, bulging. No erythema, swelling, or exudate on post pharynx.  Tonsils not swollen or erythematous. Hearing normal.  Neck: Supple, thyroid normal.  Respiratory: Respiratory effort normal, BS equal bilaterally without rales, rhonchi, wheezing or stridor.  Cardio: RRR with no MRGs. Brisk peripheral pulses without edema.  Abdomen: Soft, + BS.  Non tender, no guarding, rebound, hernias, masses. Lymphatics: Non tender without lymphadenopathy.  Musculoskeletal: Full ROM, 5/5 strength, Normal gait Skin: Warm, dry without rashes, lesions, ecchymosis.  Neuro: Cranial nerves intact. No cerebellar symptoms.  Psych: Awake and oriented X 3, normal affect, Insight and Judgment appropriate.    Garnet Sierras, NP [facility] 11:26 AM

## 2019-03-27 ENCOUNTER — Ambulatory Visit (INDEPENDENT_AMBULATORY_CARE_PROVIDER_SITE_OTHER): Payer: PPO | Admitting: Adult Health Nurse Practitioner

## 2019-03-27 ENCOUNTER — Ambulatory Visit: Payer: Self-pay | Admitting: Internal Medicine

## 2019-03-27 ENCOUNTER — Encounter: Payer: Self-pay | Admitting: Adult Health Nurse Practitioner

## 2019-03-27 ENCOUNTER — Other Ambulatory Visit: Payer: Self-pay

## 2019-03-27 VITALS — BP 160/90 | HR 60 | Temp 97.5°F | Ht 74.0 in | Wt 256.8 lb

## 2019-03-27 DIAGNOSIS — Z23 Encounter for immunization: Secondary | ICD-10-CM

## 2019-03-27 DIAGNOSIS — E785 Hyperlipidemia, unspecified: Secondary | ICD-10-CM

## 2019-03-27 DIAGNOSIS — Z6837 Body mass index (BMI) 37.0-37.9, adult: Secondary | ICD-10-CM

## 2019-03-27 DIAGNOSIS — N182 Chronic kidney disease, stage 2 (mild): Secondary | ICD-10-CM

## 2019-03-27 DIAGNOSIS — F419 Anxiety disorder, unspecified: Secondary | ICD-10-CM | POA: Diagnosis not present

## 2019-03-27 DIAGNOSIS — I10 Essential (primary) hypertension: Secondary | ICD-10-CM

## 2019-03-27 DIAGNOSIS — E1122 Type 2 diabetes mellitus with diabetic chronic kidney disease: Secondary | ICD-10-CM | POA: Diagnosis not present

## 2019-03-27 DIAGNOSIS — M1000 Idiopathic gout, unspecified site: Secondary | ICD-10-CM

## 2019-03-27 DIAGNOSIS — E1169 Type 2 diabetes mellitus with other specified complication: Secondary | ICD-10-CM | POA: Diagnosis not present

## 2019-03-27 DIAGNOSIS — E559 Vitamin D deficiency, unspecified: Secondary | ICD-10-CM | POA: Diagnosis not present

## 2019-03-27 DIAGNOSIS — Z79899 Other long term (current) drug therapy: Secondary | ICD-10-CM

## 2019-03-27 DIAGNOSIS — E66812 Obesity, class 2: Secondary | ICD-10-CM

## 2019-03-27 DIAGNOSIS — J309 Allergic rhinitis, unspecified: Secondary | ICD-10-CM | POA: Diagnosis not present

## 2019-03-28 LAB — CBC WITH DIFFERENTIAL/PLATELET
Absolute Monocytes: 370 cells/uL (ref 200–950)
Basophils Absolute: 52 cells/uL (ref 0–200)
Basophils Relative: 1.2 %
Eosinophils Absolute: 82 cells/uL (ref 15–500)
Eosinophils Relative: 1.9 %
HCT: 38.9 % (ref 38.5–50.0)
Hemoglobin: 12.3 g/dL — ABNORMAL LOW (ref 13.2–17.1)
Lymphs Abs: 1041 cells/uL (ref 850–3900)
MCH: 26.9 pg — ABNORMAL LOW (ref 27.0–33.0)
MCHC: 31.6 g/dL — ABNORMAL LOW (ref 32.0–36.0)
MCV: 84.9 fL (ref 80.0–100.0)
MPV: 11.4 fL (ref 7.5–12.5)
Monocytes Relative: 8.6 %
Neutro Abs: 2756 cells/uL (ref 1500–7800)
Neutrophils Relative %: 64.1 %
Platelets: 227 10*3/uL (ref 140–400)
RBC: 4.58 10*6/uL (ref 4.20–5.80)
RDW: 15 % (ref 11.0–15.0)
Total Lymphocyte: 24.2 %
WBC: 4.3 10*3/uL (ref 3.8–10.8)

## 2019-03-28 LAB — LIPID PANEL
Cholesterol: 162 mg/dL (ref <200)
HDL: 79 mg/dL (ref >=40)
LDL Cholesterol (Calc): 73 mg/dL (calc)
Non-HDL Cholesterol (Calc): 83 mg/dL (calc) (ref <130)
Total CHOL/HDL Ratio: 2.1 (calc) (ref <5.0)
Triglycerides: 35 mg/dL (ref <150)

## 2019-03-28 LAB — COMPLETE METABOLIC PANEL WITH GFR
AG Ratio: 1.5 (calc) (ref 1.0–2.5)
ALT: 13 U/L (ref 9–46)
AST: 15 U/L (ref 10–35)
Albumin: 4.4 g/dL (ref 3.6–5.1)
Alkaline phosphatase (APISO): 54 U/L (ref 35–144)
BUN: 16 mg/dL (ref 7–25)
CO2: 25 mmol/L (ref 20–32)
Calcium: 10.5 mg/dL — ABNORMAL HIGH (ref 8.6–10.3)
Chloride: 104 mmol/L (ref 98–110)
Creat: 1.04 mg/dL (ref 0.70–1.18)
GFR, Est African American: 80 mL/min/{1.73_m2} (ref >=60)
GFR, Est Non African American: 69 mL/min/{1.73_m2} (ref >=60)
Globulin: 3 g/dL (calc) (ref 1.9–3.7)
Glucose, Bld: 100 mg/dL — ABNORMAL HIGH (ref 65–99)
Potassium: 4.7 mmol/L (ref 3.5–5.3)
Sodium: 139 mmol/L (ref 135–146)
Total Bilirubin: 0.6 mg/dL (ref 0.2–1.2)
Total Protein: 7.4 g/dL (ref 6.1–8.1)

## 2019-03-28 LAB — TSH: TSH: 0.97 mIU/L (ref 0.40–4.50)

## 2019-03-28 LAB — VITAMIN D 25 HYDROXY (VIT D DEFICIENCY, FRACTURES): Vit D, 25-Hydroxy: 79 ng/mL (ref 30–100)

## 2019-03-28 LAB — HEMOGLOBIN A1C
Hgb A1c MFr Bld: 5.6 % of total Hgb (ref <5.7)
Mean Plasma Glucose: 114 (calc)
eAG (mmol/L): 6.3 (calc)

## 2019-03-28 LAB — MAGNESIUM: Magnesium: 1.7 mg/dL (ref 1.5–2.5)

## 2019-04-05 ENCOUNTER — Other Ambulatory Visit: Payer: Self-pay | Admitting: Adult Health Nurse Practitioner

## 2019-04-05 DIAGNOSIS — N182 Chronic kidney disease, stage 2 (mild): Secondary | ICD-10-CM

## 2019-04-05 DIAGNOSIS — E1122 Type 2 diabetes mellitus with diabetic chronic kidney disease: Secondary | ICD-10-CM

## 2019-04-14 ENCOUNTER — Telehealth: Payer: Self-pay | Admitting: *Deleted

## 2019-04-14 NOTE — Telephone Encounter (Signed)
Patient called and reported he has had diarrhea for 3 days.  Per Dr Melford Aase, he ca OTC Pepto bismol and Imodium( up to 12 tablet daily). The patient was advised to call if he continues to have diarrhea, per Dr Melford Aase.

## 2019-05-07 ENCOUNTER — Telehealth: Payer: Self-pay | Admitting: Physician Assistant

## 2019-05-07 MED ORDER — HYOSCYAMINE SULFATE SL 0.125 MG SL SUBL: 0.1250 mg | SUBLINGUAL_TABLET | SUBLINGUAL | 0 refills | Status: DC | PRN

## 2019-05-07 NOTE — Telephone Encounter (Signed)
Message sent to front office for scheduling.

## 2019-05-07 NOTE — Telephone Encounter (Signed)
-----   Message from Elenor Quinones, Schleicher sent at 05/05/2019  3:06 PM EDT ----- Regarding: med request Contact: [phone] Reports WATERY STOOLS  Wants to know if you will call in a RX for him. Already tried & failed OTC meds for this issue.   Please & thank you.  [facility]

## 2019-06-01 ENCOUNTER — Other Ambulatory Visit: Payer: Self-pay | Admitting: Physician Assistant

## 2019-06-18 ENCOUNTER — Other Ambulatory Visit: Payer: Self-pay | Admitting: Internal Medicine

## 2019-07-01 ENCOUNTER — Encounter: Payer: Self-pay | Admitting: Internal Medicine

## 2019-07-01 DIAGNOSIS — H524 Presbyopia: Secondary | ICD-10-CM | POA: Diagnosis not present

## 2019-07-01 LAB — HM DIABETES EYE EXAM

## 2019-07-01 NOTE — Patient Instructions (Signed)

## 2019-07-01 NOTE — Progress Notes (Signed)
History of Present Illness:      This very nice 78 y.o. DBM presents for 6 month follow up with HTN, HLD, T2_NIDDM and Vitamin D Deficiency. Patient has hx/o Gout controlled on his meds.       Patient is treated for HTN (1989)  & BP has been controlled at home. Today's BP was initially slightly elevated & rechecked at goal - 136/84.  In 2008, patient had a False Positive Cardiolite with a Normal Heart Cath. Patient has had no complaints of any cardiac type chest pain, palpitations, dyspnea / orthopnea / PND, dizziness, claudication, or dependent edema.      Hyperlipidemia is controlled with diet & meds. Patient denies myalgias or other med SE's. Last Lipids were at goal:  Lab Results  Component Value Date   CHOL 175 07/02/2019   HDL 81 07/02/2019   LDLCALC 81 07/02/2019   TRIG 52 07/02/2019   CHOLHDL 2.2 07/02/2019        Also, the patient has moderate obesity (BMI 31+) and history of T2_NIDDM (2005) w/CKD2and has had no symptoms of reactive hypoglycemia, diabetic polys, paresthesias or visual blurring.  Last A1c was at goal:  Lab Results  Component Value Date   HGBA1C 5.8 (H) 07/02/2019        Further, the patient also has history of Vitamin D Deficiency ("29" / 2008)  and supplements vitamin D without any suspected side-effects. Last vitamin D was at goal:  Lab Results  Component Value Date   VD25OH 79 07/02/2019    Current Outpatient Medications on File Prior to Visit  Medication Sig  . allopurinol (ZYLOPRIM) 300 MG tablet TAKE 1 TABLET BY MOUTH ONCE DAILY FOR GOUT  . ALPRAZolam (XANAX) 1 MG tablet Take 1/2 to 1 tab 2 to 3 x / day ONLY if needed for Anxiety &  limit to 5 days /week to avoid addiction (last 6 weeks til July 28)  . amLODipine (NORVASC) 5 MG tablet Take 1 tablet Daily for BP  . Ascorbic Acid (VITAMIN C) 1000 MG tablet Take 1,000 mg by mouth daily.  Marland Kitchen aspirin (ASPIRIN LOW DOSE) 81 MG tablet Take 81 mg by mouth daily.   Marland Kitchen azelastine (ASTELIN) 0.1 % nasal  spray Place 2 sprays into both nostrils 2 (two) times daily. Use in each nostril as directed  . blood glucose meter kit and supplies KIT CHECK BLOOD SUGAR 1 TIME DAILY-E11.9  . bumetanide (BUMEX) 2 MG tablet Take 1 tablet by mouth twice daily  . Cholecalciferol (VITAMIN D PO) Take 10,000 Units by mouth.   Marland Kitchen glucose blood (ONETOUCH ULTRA) test strip Test blood sugar once daily  . Hyoscyamine Sulfate SL (LEVSIN/SL) 0.125 MG SUBL Place 0.125 mg under the tongue every 4 (four) hours as needed.  . IRON PO Take 18 mg by mouth daily.  . Lancets MISC CHECK BLOOD SUGAR 1 TIME DAILY-E11.9  . MAGNESIUM PO Take 500 mg by mouth 2 (two) times daily.   . metFORMIN (GLUCOPHAGE) 500 MG tablet Take 2 tablets 2 x /day with Meals for Diabetes  . mometasone (NASONEX) 50 MCG/ACT nasal spray Place 1 spray into the nose 2 (two) times daily as needed. 1 spray into each nostril.  . montelukast (SINGULAIR) 10 MG tablet Take 1 tablet daily for Allergies  . zinc gluconate 50 MG tablet Take 50 mg by mouth daily.  Marland Kitchen atorvastatin (LIPITOR) 10 MG tablet Take 1 tablet (10 mg total) by mouth daily.   No  current facility-administered medications on file prior to visit.    Allergies  Allergen Reactions  . Ace Inhibitors     PMHx:   Past Medical History:  Diagnosis Date  . Adenomatous colon polyp    2010  . Gout   . Hyperlipidemia   . Hypertension   . Prostate cancer ([place]) 2010   treated by external beam radiation  . Type II or unspecified type diabetes mellitus without mention of complication, not stated as uncontrolled   . Vitamin D deficiency    Immunization History  Administered Date(s) Administered  . DT 03/21/2012  . Influenza, High Dose Seasonal PF 03/27/2019  . Influenza-Unspecified 04/16/2014, 04/16/2015, 04/16/2018  . Pneumococcal Conjugate-13 05/19/2014  . Pneumococcal Polysaccharide-23 07/24/2007   Past Surgical History:  Procedure Laterality Date  . APPENDECTOMY  1994   and GALLBLADDER  .  CHOLECYSTECTOMY  1994  . colovesical fistula repair  01/2011  . KNEE SURGERY  1996    FHx:    Reviewed / unchanged  SHx:    Reviewed / unchanged   Systems Review:  Constitutional: Denies fever, chills, wt changes, headaches, insomnia, fatigue, night sweats, change in appetite. Eyes: Denies redness, blurred vision, diplopia, discharge, itchy, watery eyes.  ENT: Denies discharge, congestion, post nasal drip, epistaxis, sore throat, earache, hearing loss, dental pain, tinnitus, vertigo, sinus pain, snoring.  CV: Denies chest pain, palpitations, irregular heartbeat, syncope, dyspnea, diaphoresis, orthopnea, PND, claudication or edema. Respiratory: denies cough, dyspnea, DOE, pleurisy, hoarseness, laryngitis, wheezing.  Gastrointestinal: Denies dysphagia, odynophagia, heartburn, reflux, water brash, abdominal pain or cramps, nausea, vomiting, bloating, diarrhea, constipation, hematemesis, melena, hematochezia  or hemorrhoids. Genitourinary: Denies dysuria, frequency, urgency, nocturia, hesitancy, discharge, hematuria or flank pain. Musculoskeletal: Denies arthralgias, myalgias, stiffness, jt. swelling, pain, limping or strain/sprain.  Skin: Denies pruritus, rash, hives, warts, acne, eczema or change in skin lesion(s). Neuro: No weakness, tremor, incoordination, spasms, paresthesia or pain. Psychiatric: Denies confusion, memory loss or sensory loss. Endo: Denies change in weight, skin or hair change.  Heme/Lymph: No excessive bleeding, bruising or enlarged lymph nodes.  Physical Exam  BP 136/84   Pulse 76   Temp (!) 97 F (36.1 C)   Resp 18   Ht _0  (1.88 m)   Wt 262 lb 6.4 oz (119 kg)   BMI 33.69 kg/m   Appears  Over nourished, well groomed  and in no distress.  Eyes: PERRLA, EOMs, conjunctiva no swelling or erythema. Sinuses: No frontal/maxillary tenderness ENT/Mouth: EAC's clear, TM's nl w/o erythema, bulging. Nares clear w/o erythema, swelling, exudates. Oropharynx clear  without erythema or exudates. Oral hygiene is good. Tongue normal, non obstructing. Hearing intact.  Neck: Supple. Thyroid not palpable. Car 2+/2+ without bruits, nodes or JVD. Chest: Respirations nl with BS clear & equal w/o rales, rhonchi, wheezing or stridor.  Cor: Heart sounds normal w/ regular rate and rhythm without sig. murmurs, gallops, clicks or rubs. Peripheral pulses normal and equal  without edema.  Abdomen: Soft & bowel sounds normal. Non-tender w/o guarding, rebound, hernias, masses or organomegaly.  Lymphatics: Unremarkable.  Musculoskeletal: Full ROM all peripheral extremities, joint stability, 5/5 strength and normal gait.  Skin: Warm, dry without exposed rashes, lesions or ecchymosis apparent.  Neuro: Cranial nerves intact, reflexes equal bilaterally. Sensory-motor testing grossly intact. Tendon reflexes grossly intact.  Pysch: Alert & oriented x 3.  Insight and judgement nl & appropriate. No ideations.  Assessment and Plan:  1. Essential hypertension  - Continue medication, monitor blood pressure at home.  - Continue  DASH diet.  Reminder to go to the ER if any CP,  SOB, nausea, dizziness, severe HA, changes vision/speech.  - CBC with Diff - COMPLETE METABOLIC PANEL WITH GFR - Magnesium - TSH  2. Hyperlipidemia associated with type 2 diabetes mellitus ([place])  - Continue diet/meds, exercise,& lifestyle modifications.  - Continue monitor periodic cholesterol/liver & renal functions   - Lipid Profile - TSH  3. Type 2 diabetes mellitus with stage 2 chronic kidney disease,  without long-term current use of insulin (HCC)  - Continue diet, exercise  - Lifestyle modifications.  - Monitor appropriate labs.  - Hemoglobin A1c ([facility]) - Insulin, random  4. Vitamin D deficiency  - Continue supplementation.  - Vitamin D (25 hydroxy)  5. Idiopathic gout  - Uric acid  6. Medication management  - CBC with Diff - COMPLETE METABOLIC PANEL WITH GFR -  Magnesium - Lipid Profile - TSH - Hemoglobin A1c ([facility]) - Insulin, random - Vitamin D (25 hydroxy) - Uric acid        Discussed  regular exercise, BP monitoring, weight control to achieve/maintain BMI less than 25 and discussed med and SE's. Recommended labs to assess and monitor clinical status with further disposition pending results of labs.  I discussed the assessment and treatment plan with the patient. The patient was provided an opportunity to ask questions and all were answered. The patient agreed with the plan and demonstrated an understanding of the instructions.  I provided over 30 minutes of exam, counseling, chart review and  complex critical decision making.  Kirtland Bouchard, MD

## 2019-07-02 ENCOUNTER — Other Ambulatory Visit: Payer: Self-pay

## 2019-07-02 ENCOUNTER — Ambulatory Visit (INDEPENDENT_AMBULATORY_CARE_PROVIDER_SITE_OTHER): Payer: PPO | Admitting: Internal Medicine

## 2019-07-02 VITALS — BP 136/84 | HR 76 | Temp 97.0°F | Resp 18 | Ht 74.0 in | Wt 262.4 lb

## 2019-07-02 DIAGNOSIS — N182 Chronic kidney disease, stage 2 (mild): Secondary | ICD-10-CM | POA: Diagnosis not present

## 2019-07-02 DIAGNOSIS — M1000 Idiopathic gout, unspecified site: Secondary | ICD-10-CM | POA: Diagnosis not present

## 2019-07-02 DIAGNOSIS — E559 Vitamin D deficiency, unspecified: Secondary | ICD-10-CM

## 2019-07-02 DIAGNOSIS — E1122 Type 2 diabetes mellitus with diabetic chronic kidney disease: Secondary | ICD-10-CM

## 2019-07-02 DIAGNOSIS — I10 Essential (primary) hypertension: Secondary | ICD-10-CM | POA: Diagnosis not present

## 2019-07-02 DIAGNOSIS — E785 Hyperlipidemia, unspecified: Secondary | ICD-10-CM | POA: Diagnosis not present

## 2019-07-02 DIAGNOSIS — E1169 Type 2 diabetes mellitus with other specified complication: Secondary | ICD-10-CM | POA: Diagnosis not present

## 2019-07-02 DIAGNOSIS — Z79899 Other long term (current) drug therapy: Secondary | ICD-10-CM | POA: Diagnosis not present

## 2019-07-03 LAB — TSH: TSH: 1.32 mIU/L (ref 0.40–4.50)

## 2019-07-03 LAB — COMPLETE METABOLIC PANEL WITH GFR
AG Ratio: 1.2 (calc) (ref 1.0–2.5)
ALT: 18 U/L (ref 9–46)
AST: 18 U/L (ref 10–35)
Albumin: 4.3 g/dL (ref 3.6–5.1)
Alkaline phosphatase (APISO): 71 U/L (ref 35–144)
BUN/Creatinine Ratio: 17 (calc) (ref 6–22)
BUN: 21 mg/dL (ref 7–25)
CO2: 32 mmol/L (ref 20–32)
Calcium: 11 mg/dL — ABNORMAL HIGH (ref 8.6–10.3)
Chloride: 98 mmol/L (ref 98–110)
Creat: 1.22 mg/dL — ABNORMAL HIGH (ref 0.70–1.18)
GFR, Est African American: 65 mL/min/{1.73_m2} (ref >=60)
GFR, Est Non African American: 56 mL/min/{1.73_m2} — ABNORMAL LOW (ref >=60)
Globulin: 3.6 g/dL (calc) (ref 1.9–3.7)
Glucose, Bld: 108 mg/dL — ABNORMAL HIGH (ref 65–99)
Potassium: 3.9 mmol/L (ref 3.5–5.3)
Sodium: 140 mmol/L (ref 135–146)
Total Bilirubin: 0.4 mg/dL (ref 0.2–1.2)
Total Protein: 7.9 g/dL (ref 6.1–8.1)

## 2019-07-03 LAB — URIC ACID: Uric Acid, Serum: 5.1 mg/dL (ref 4.0–8.0)

## 2019-07-03 LAB — LIPID PANEL
Cholesterol: 175 mg/dL (ref <200)
HDL: 81 mg/dL (ref >=40)
LDL Cholesterol (Calc): 81 mg/dL (calc)
Non-HDL Cholesterol (Calc): 94 mg/dL (calc) (ref <130)
Total CHOL/HDL Ratio: 2.2 (calc) (ref <5.0)
Triglycerides: 52 mg/dL (ref <150)

## 2019-07-03 LAB — VITAMIN D 25 HYDROXY (VIT D DEFICIENCY, FRACTURES): Vit D, 25-Hydroxy: 79 ng/mL (ref 30–100)

## 2019-07-03 LAB — CBC WITH DIFFERENTIAL/PLATELET
Absolute Monocytes: 440 cells/uL (ref 200–950)
Basophils Absolute: 58 cells/uL (ref 0–200)
Basophils Relative: 1.1 %
Eosinophils Absolute: 122 cells/uL (ref 15–500)
Eosinophils Relative: 2.3 %
HCT: 40.4 % (ref 38.5–50.0)
Hemoglobin: 12.9 g/dL — ABNORMAL LOW (ref 13.2–17.1)
Lymphs Abs: 1219 cells/uL (ref 850–3900)
MCH: 27.6 pg (ref 27.0–33.0)
MCHC: 31.9 g/dL — ABNORMAL LOW (ref 32.0–36.0)
MCV: 86.3 fL (ref 80.0–100.0)
MPV: 11.6 fL (ref 7.5–12.5)
Monocytes Relative: 8.3 %
Neutro Abs: 3461 cells/uL (ref 1500–7800)
Neutrophils Relative %: 65.3 %
Platelets: 248 10*3/uL (ref 140–400)
RBC: 4.68 10*6/uL (ref 4.20–5.80)
RDW: 14.3 % (ref 11.0–15.0)
Total Lymphocyte: 23 %
WBC: 5.3 10*3/uL (ref 3.8–10.8)

## 2019-07-03 LAB — HEMOGLOBIN A1C
Hgb A1c MFr Bld: 5.8 % of total Hgb — ABNORMAL HIGH (ref <5.7)
Mean Plasma Glucose: 120 (calc)
eAG (mmol/L): 6.6 (calc)

## 2019-07-03 LAB — INSULIN, RANDOM: Insulin: 11.4 u[IU]/mL

## 2019-07-03 LAB — MAGNESIUM: Magnesium: 1.9 mg/dL (ref 1.5–2.5)

## 2019-07-06 ENCOUNTER — Encounter: Payer: Self-pay | Admitting: Internal Medicine

## 2019-08-03 ENCOUNTER — Other Ambulatory Visit: Payer: Self-pay | Admitting: Internal Medicine

## 2019-08-03 DIAGNOSIS — F411 Generalized anxiety disorder: Secondary | ICD-10-CM

## 2019-08-03 MED ORDER — ALPRAZOLAM 1 MG PO TABS: ORAL_TABLET | ORAL | 0 refills | Status: AC

## 2019-08-17 ENCOUNTER — Other Ambulatory Visit: Payer: Self-pay | Admitting: Physician Assistant

## 2019-08-24 ENCOUNTER — Other Ambulatory Visit: Payer: Self-pay | Admitting: Physician Assistant

## 2019-09-22 ENCOUNTER — Encounter: Payer: Self-pay | Admitting: Internal Medicine

## 2019-09-29 ENCOUNTER — Other Ambulatory Visit: Payer: Self-pay | Admitting: Internal Medicine

## 2019-10-07 NOTE — Progress Notes (Signed)
MEDICARE ANNUAL WELLNESS VISIT AND FOLLOW UP Assessment:   Diagnoses and all orders for this visit:  Encounter for Medicare annual wellness exam  Essential hypertension Continue medications - atenolol and bumetanide Monitor blood pressure at home; call if consistently over 130/80 Continue DASH diet.   Reminder to go to the ER if any CP, SOB, nausea, dizziness, severe HA, changes vision/speech, left arm numbness and tingling and jaw pain.  Type 2 diabetes mellitus with stage 2 chronic kidney disease, without long-term current use of insulin [facility]) Education: Reviewed 'ABCs' of diabetes management (respective goals in parentheses):  A1C (<7), blood pressure (<130/80), and cholesterol (LDL <70) Eye Exam yearly and Dental Exam every 6 months. Dietary recommendations Physical Activity recommendations Foot exam UTD  CKD stage 2 due to type 2 diabetes mellitus (HCC) Increase fluids, avoid NSAIDS, monitor sugars, will monitor  Vitamin D deficiency At goal at recent check; continue to recommend supplementation for goal of 60-100 Defer vitamin D level  Obesity (BMI 34) Long discussion about weight loss, diet, and exercise Recommended diet heavy in fruits and veggies and low in animal meats, cheeses, and dairy products, appropriate calorie intake He will work on cutting down on sweets Discussed appropriate weight for height  - next weight goal of 230 lb, long term goal <190lb per patient Follow up at next visit  Medication management CBC, CMP/GFR, magnesium   Hypertensive retinopathy of both eyes Control BP, continue follow up with ophthalmology  Mixed hyperlipidemia Continue medications; currently at goal Continue low cholesterol diet and exercise.  Check lipid panel.   History of colonic polyps Up to date on colonoscopies  H/O prostate cancer (2006) Continue to monitor, follows with [facility]   Idiopathic gout, unspecified chronicity, unspecified site Continue  allopurinol Diet discussed Check uric acid as needed  Anxiety Well managed by current regimen; continue to taper benzo, discussed poor outcomes associated with benzo use in elders, taper to D/C if possible, he is in agreement and optimistic he can achieve this Stress management techniques discussed, increase water, good sleep hygiene discussed, increase exercise, and increase veggies.   Thickened/long toe nails Diabetic with toe nail deformities and needing asisstance with foot care Will refer to podiatry    Over 30 minutes of exam, counseling, chart review, and critical decision making was performed  Future Appointments  Date Time Provider [place]  01/13/2020 11:00 AM [name], MD [facility] None     Plan:   During the course of the visit the patient was educated and counseled about appropriate screening and preventive services including:    Pneumococcal vaccine   Influenza vaccine  Prevnar 13  Td vaccine  Screening electrocardiogram  Colorectal cancer screening  Diabetes screening  Glaucoma screening  Nutrition counseling    Subjective:  Marcus Chambers is a 79 y.o. male who presents for Medicare Annual Wellness Visit and 3 month follow up for HTN, hyperlipidemia, T2DM, and vitamin D Def.   He has hx of prostate cancer circa 2010, underwent radiation, was followedannually by [facility], last visit 06/25/2018 but now monitoring at this office, PSAs have been stable, denies urinary sx.   he has a diagnosis of anxiety/insomnia and is currently on xanax 0.5-1 mg TID PRN, reports symptoms are well controlled on current regimen. he reports he currently typically takes 0.5 mg at night for sleep if needed, typically takes 3-4 times a week but has been reducing use.   BMI is Body mass index is 34.41 kg/m., he has been  working on diet, plans to join [employer] eventually, currently walking on treadmill 30 min 2-3 days/week.  Wt Readings from  Last 3 Encounters:  10/08/19 268 lb (121.6 kg)  07/02/19 262 lb 6.4 oz (119 kg)  03/27/19 256 lb 12.8 oz (116.5 kg)   His blood pressure has been controlled at home (130/75), today their BP is BP: 124/74 He does workout. He denies chest pain, shortness of breath, dizziness.   He is on cholesterol medication (atorvastatin 10 mg daily) and denies myalgias. His cholesterol is not at goal. The cholesterol last visit was:   Lab Results  Component Value Date   CHOL 175 07/02/2019   HDL 81 07/02/2019   LDLCALC 81 07/02/2019   TRIG 52 07/02/2019   CHOLHDL 2.2 07/02/2019   He has been working on diet and exercise for T2 diabetes controlled on metformin 2000 mg daily, and denies foot ulcerations, increased appetite, nausea, paresthesia of the feet, polydipsia, polyuria, visual disturbances, vomiting and weight loss. He checks sugars 2-3 days a week, "hasn't been bad" but cannot recall exact numbers today, estimates 80-90s typically. Last A1C in the office was:  Lab Results  Component Value Date   HGBA1C 5.8 (H) 07/02/2019   He has CKD II associated with T2DM monitored at this office, acute drop at last check:  Lab Results  Component Value Date   GFRAA 65 07/02/2019    Patient is on Vitamin D supplement and at goal at recent check:   Lab Results  Component Value Date   VD25OH 79 07/02/2019     Patient is on allopurinol for gout and does not report a recent flare.  Lab Results  Component Value Date   LABURIC 5.1 07/02/2019   ? On iron, per patient for 4-5 years, no recent check: No results found for: IRON, TIBC, FERRITIN  Lab Results  Component Value Date   WBC 5.3 07/02/2019   HGB 12.9 (L) 07/02/2019   HCT 40.4 07/02/2019   MCV 86.3 07/02/2019   PLT 248 07/02/2019      Medication Review:  Current Outpatient Medications (Endocrine & Metabolic):  .  metFORMIN (GLUCOPHAGE) 500 MG tablet, Take 2 tablets 2 x /day with Meals for Diabetes  Current Outpatient Medications  (Cardiovascular):  .  amLODipine (NORVASC) 5 MG tablet, Take 1 tablet Daily for BP .  atorvastatin (LIPITOR) 10 MG tablet, Take 1 tablet Daily for Cholesterol .  bumetanide (BUMEX) 2 MG tablet, Take 1 tablet by mouth twice daily  Current Outpatient Medications (Respiratory):  .  azelastine (ASTELIN) 0.1 % nasal spray, Place 2 sprays into both nostrils 2 (two) times daily. Use in each nostril as directed .  mometasone (NASONEX) 50 MCG/ACT nasal spray, Place 1 spray into the nose 2 (two) times daily as needed. 1 spray into each nostril. .  montelukast (SINGULAIR) 10 MG tablet, TAKE 1 TABLET DAILY FOR ALLERGIES  Current Outpatient Medications (Analgesics):  .  allopurinol (ZYLOPRIM) 300 MG tablet, TAKE 1 TABLET BY MOUTH ONCE DAILY FOR GOUT .  aspirin (ASPIRIN LOW DOSE) 81 MG tablet, Take 81 mg by mouth daily.   Current Outpatient Medications (Hematological):  Marland Kitchen  IRON PO, Take 18 mg by mouth daily.  Current Outpatient Medications (Other):  Marland Kitchen  Ascorbic Acid (VITAMIN C) 1000 MG tablet, Take 1,000 mg by mouth daily. .  blood glucose meter kit and supplies KIT, CHECK BLOOD SUGAR 1 TIME DAILY-E11.9 .  Cholecalciferol (VITAMIN D PO), Take 10,000 Units by mouth.  Marland Kitchen  glucose blood (ONETOUCH ULTRA) test strip, Test blood sugar once daily .  Hyoscyamine Sulfate SL (LEVSIN/SL) 0.125 MG SUBL, Place 0.125 mg under the tongue every 4 (four) hours as needed. .  Lancets MISC, CHECK BLOOD SUGAR 1 TIME DAILY-E11.9 .  MAGNESIUM PO, Take 500 mg by mouth 2 (two) times daily.  Marland Kitchen  zinc gluconate 50 MG tablet, Take 50 mg by mouth daily.  Allergies: Allergies  Allergen Reactions  . Ace Inhibitors     Current Problems (verified) has History of colonic polyps; Type 2 diabetes mellitus ([place]); Hyperlipidemia associated with type 2 diabetes mellitus ([place]); Gout; Vitamin D deficiency; Essential hypertension; Medication management; H/O prostate cancer (2006); Obesity (BMI 30.0-34.9); Hypertensive retinopathy; CKD  stage 2 due to type 2 diabetes mellitus ([place]); Anxiety; and ACEI/ARB contraindicated on their problem list.  Screening Tests Immunization History  Administered Date(s) Administered  . DT (Pediatric) 03/21/2012  . Influenza, High Dose Seasonal PF 03/27/2019  . Influenza-Unspecified 04/16/2014, 04/16/2015, 04/16/2018  . Pneumococcal Conjugate-13 05/19/2014  . Pneumococcal Polysaccharide-23 07/24/2007   Preventative care: Last colonoscopy: 2016 due 2023  Prior vaccinations: TD or Tdap: 2013  Influenza: 03/2019 Pneumococcal: 2009  Prevnar13: 2015 Shingles/Zostavax: declines, cost Covid 19: 2/2 2021  Names of Other Physician/Practitioners you currently use: 1. [facility] here for primary care 2. Dr. Rosana Hoes in [place] [facility]), eye doctor, last visit 07/01/2019 - report received and abstracted 3. Dr. Woodfin Ganja, dentist, overdue - looking for dentist right now  Patient Care Team: [name], MD as PCP - General (Internal Medicine) Inda Castle, MD (Inactive) as Consulting Physician (Gastroenterology) Rana Snare, MD as Consulting Physician (Urology) Johnathan Hausen, MD as Consulting Physician (General Surgery)  Surgical: He  has a past surgical history that includes Appendectomy (1994); Cholecystectomy (1994); Knee surgery (1996); and colovesical fistula repair (01/2011). Family His family history is not on file. Social history  He reports that he quit smoking about 34 years ago. He has never used smokeless tobacco. He reports that he does not drink alcohol or use drugs.  MEDICARE WELLNESS OBJECTIVES: Physical activity: Current Exercise Habits: Home exercise routine, Type of exercise: walking, Time (Minutes): 30, Frequency (Times/Week): 3, Weekly Exercise (Minutes/Week): 90, Intensity: Mild, Exercise limited by: None identified Cardiac risk factors: Cardiac Risk Factors include: advanced age (>94mn, >>61women);diabetes  mellitus;dyslipidemia;hypertension;obesity (BMI >30kg/m2);smoking/ tobacco exposure;male gender Depression/mood screen:   [facility] 10/08/2019  Decreased Interest 0  Down, Depressed, Hopeless 0  PHQ - 2 Score 0    ADLs:  In your present state of health, do you have any difficulty performing the following activities: 10/08/2019 07/01/2019  Hearing? N N  Vision? N N  Difficulty concentrating or making decisions? N N  Walking or climbing stairs? N N  Comment - -  Dressing or bathing? N N  Doing errands, shopping? N N  Some recent data might be hidden     Cognitive Testing  Alert? Yes  Normal Appearance?Yes  Oriented to person? Yes  Place? Yes   Time? Yes  Recall of three objects?  Yes  Can perform simple calculations? Yes  Displays appropriate judgment?Yes  Can read the correct time from a watch face?Yes  EOL planning: Does Patient Have a Medical Advance Directive?: Yes Type of Advance Directive: Healthcare Power of Attorney, Living will Does patient want to make changes to medical advance directive?: No - Patient declined Copy of HM[place]in Chart?: No - copy requested   Objective:  Today's Vitals   10/08/19 0950  BP: 124/74  Pulse: 65  Temp: (!) 97.3 F (36.3 C)  SpO2: 98%  Weight: 268 lb (121.6 kg)  Height: _0  (1.88 m)   Body mass index is 34.41 kg/m.  General appearance: alert, no distress, WD/WN, obese male HEENT: normocephalic, sclerae anicteric, TMs pearly, nares patent, no discharge or erythema, pharynx normal Oral cavity: MMM, no lesions Neck: supple, no lymphadenopathy, no thyromegaly, no masses Heart: RRR, normal S1, S2, no murmurs Lungs: CTA bilaterally, no wheezes, rhonchi, or rales Abdomen: +bs, soft, pendulous, non tender, non distended, no masses, no hepatomegaly, no splenomegaly Musculoskeletal: nontender, no swelling, no obvious deformity Extremities: no edema, no cyanosis, no clubbing. Toe nails long,  curved, thickened.  Pulses: 2+ symmetric, upper and lower extremities, normal cap refill Neurological: alert, oriented x 3, CN2-12 intact, strength normal upper extremities and lower extremities, sensation normal throughout, DTRs 2+ throughout, no cerebellar signs, gait slow steady Psychiatric: normal affect, behavior normal, pleasant   Medicare Attestation I have personally reviewed: The patient's medical and social history Their use of alcohol, tobacco or illicit drugs Their current medications and supplements The patient's functional ability including ADLs,fall risks, home safety risks, cognitive, and hearing and visual impairment Diet and physical activities Evidence for depression or mood disorders  The patient's weight, height, BMI, and visual acuity have been recorded in the chart.  I have made referrals, counseling, and provided education to the patient based on review of the above and I have provided the patient with a written personalized care plan for preventive services.     Izora Ribas, NP   10/08/2019

## 2019-10-08 ENCOUNTER — Ambulatory Visit (INDEPENDENT_AMBULATORY_CARE_PROVIDER_SITE_OTHER): Payer: PPO | Admitting: Adult Health

## 2019-10-08 ENCOUNTER — Encounter: Payer: Self-pay | Admitting: Adult Health

## 2019-10-08 ENCOUNTER — Other Ambulatory Visit: Payer: Self-pay

## 2019-10-08 VITALS — BP 124/74 | HR 65 | Temp 97.3°F | Ht 74.0 in | Wt 268.0 lb

## 2019-10-08 DIAGNOSIS — F419 Anxiety disorder, unspecified: Secondary | ICD-10-CM

## 2019-10-08 DIAGNOSIS — N182 Chronic kidney disease, stage 2 (mild): Secondary | ICD-10-CM | POA: Diagnosis not present

## 2019-10-08 DIAGNOSIS — Z79899 Other long term (current) drug therapy: Secondary | ICD-10-CM

## 2019-10-08 DIAGNOSIS — Z860100 Personal history of colon polyps, unspecified: Secondary | ICD-10-CM

## 2019-10-08 DIAGNOSIS — Z0001 Encounter for general adult medical examination with abnormal findings: Secondary | ICD-10-CM | POA: Diagnosis not present

## 2019-10-08 DIAGNOSIS — E611 Iron deficiency: Secondary | ICD-10-CM | POA: Diagnosis not present

## 2019-10-08 DIAGNOSIS — E1169 Type 2 diabetes mellitus with other specified complication: Secondary | ICD-10-CM

## 2019-10-08 DIAGNOSIS — E1122 Type 2 diabetes mellitus with diabetic chronic kidney disease: Secondary | ICD-10-CM | POA: Diagnosis not present

## 2019-10-08 DIAGNOSIS — H35033 Hypertensive retinopathy, bilateral: Secondary | ICD-10-CM

## 2019-10-08 DIAGNOSIS — E785 Hyperlipidemia, unspecified: Secondary | ICD-10-CM | POA: Diagnosis not present

## 2019-10-08 DIAGNOSIS — L602 Onychogryphosis: Secondary | ICD-10-CM

## 2019-10-08 DIAGNOSIS — E669 Obesity, unspecified: Secondary | ICD-10-CM

## 2019-10-08 DIAGNOSIS — Z0000 Encounter for general adult medical examination without abnormal findings: Secondary | ICD-10-CM

## 2019-10-08 DIAGNOSIS — E66811 Obesity, class 1: Secondary | ICD-10-CM

## 2019-10-08 DIAGNOSIS — E559 Vitamin D deficiency, unspecified: Secondary | ICD-10-CM

## 2019-10-08 DIAGNOSIS — I10 Essential (primary) hypertension: Secondary | ICD-10-CM | POA: Diagnosis not present

## 2019-10-08 DIAGNOSIS — R6889 Other general symptoms and signs: Secondary | ICD-10-CM

## 2019-10-08 DIAGNOSIS — Z5309 Procedure and treatment not carried out because of other contraindication: Secondary | ICD-10-CM | POA: Diagnosis not present

## 2019-10-08 DIAGNOSIS — Z8546 Personal history of malignant neoplasm of prostate: Secondary | ICD-10-CM | POA: Diagnosis not present

## 2019-10-08 DIAGNOSIS — M1000 Idiopathic gout, unspecified site: Secondary | ICD-10-CM | POA: Diagnosis not present

## 2019-10-08 NOTE — Patient Instructions (Addendum)
Marcus Chambers , Thank you for taking time to come for your Medicare Wellness Visit. I appreciate your ongoing commitment to your health goals. Please review the following plan we discussed and let me know if I can assist you in the future.   These are the goals we discussed: Goals    . Exercise 150 min/wk Moderate Activity    . LDL CALC < 70    . Weight (lb) < 230 lb (104.3 kg)       This is a list of the screening recommended for you and due dates:  Health Maintenance  Topic Date Due  . Complete foot exam   09/02/2019  . Urine Protein Check  11/17/2019*  . Hemoglobin A1C  12/31/2019  . Eye exam for diabetics  06/30/2020  . Colon Cancer Screening  07/21/2021  . Tetanus Vaccine  03/21/2022  . Flu Shot  Completed  . Pneumonia vaccines  Completed  *Topic was postponed. The date shown is not the original due date.      Please schedule an appointment with dentist   Will refer to podiatry to help with getting nails trimmed and treat the thickened nails if needed    Diabetes Mellitus and [place] care is an important part of your health, especially when you have diabetes. Diabetes may cause you to have problems because of poor blood flow (circulation) to your feet and legs, which can cause your skin to:  Become thinner and drier.  Break more easily.  Heal more slowly.  Peel and crack. You may also have nerve damage (neuropathy) in your legs and feet, causing decreased feeling in them. This means that you may not notice minor injuries to your feet that could lead to more serious problems. Noticing and addressing any potential problems early is the best way to prevent future foot problems. How to care for your feet Foot hygiene  Wash your feet daily with warm water and mild soap. Do not use hot water. Then, pat your feet and the areas between your toes until they are completely dry. Do not soak your feet as this can dry your skin.  Trim your toenails straight across. Do  not dig under them or around the cuticle. File the edges of your nails with an emery board or nail file.  Apply a moisturizing lotion or petroleum jelly to the skin on your feet and to dry, brittle toenails. Use lotion that does not contain alcohol and is unscented. Do not apply lotion between your toes. Shoes and socks  Wear clean socks or stockings every day. Make sure they are not too tight. Do not wear knee-high stockings since they may decrease blood flow to your legs.  Wear shoes that fit properly and have enough cushioning. Always look in your shoes before you put them on to be sure there are no objects inside.  To break in new shoes, wear them for just a few hours a day. This prevents injuries on your feet. Wounds, scrapes, corns, and calluses  Check your feet daily for blisters, cuts, bruises, sores, and redness. If you cannot see the bottom of your feet, use a mirror or ask someone for help.  Do not cut corns or calluses or try to remove them with medicine.  If you find a minor scrape, cut, or break in the skin on your feet, keep it and the skin around it clean and dry. You may clean these areas with mild soap and water. Do not  clean the area with peroxide, alcohol, or iodine.  If you have a wound, scrape, corn, or callus on your foot, look at it several times a day to make sure it is healing and not infected. Check for: ? Redness, swelling, or pain. ? Fluid or blood. ? Warmth. ? Pus or a bad smell. General instructions  Do not cross your legs. This may decrease blood flow to your feet.  Do not use heating pads or hot water bottles on your feet. They may burn your skin. If you have lost feeling in your feet or legs, you may not know this is happening until it is too late.  Protect your feet from hot and cold by wearing shoes, such as at the beach or on hot pavement.  Schedule a complete foot exam at least once a year (annually) or more often if you have foot problems. If you  have foot problems, report any cuts, sores, or bruises to your health care provider immediately. Contact a health care provider if:  You have a medical condition that increases your risk of infection and you have any cuts, sores, or bruises on your feet.  You have an injury that is not healing.  You have redness on your legs or feet.  You feel burning or tingling in your legs or feet.  You have pain or cramps in your legs and feet.  Your legs or feet are numb.  Your feet always feel cold.  You have pain around a toenail. Get help right away if:  You have a wound, scrape, corn, or callus on your foot and: ? You have pain, swelling, or redness that gets worse. ? You have fluid or blood coming from the wound, scrape, corn, or callus. ? Your wound, scrape, corn, or callus feels warm to the touch. ? You have pus or a bad smell coming from the wound, scrape, corn, or callus. ? You have a fever. ? You have a red line going up your leg. Summary  Check your feet every day for cuts, sores, red spots, swelling, and blisters.  Moisturize feet and legs daily.  Wear shoes that fit properly and have enough cushioning.  If you have foot problems, report any cuts, sores, or bruises to your health care provider immediately.  Schedule a complete foot exam at least once a year (annually) or more often if you have foot problems. This information is not intended to replace advice given to you by your health care provider. Make sure you discuss any questions you have with your health care provider. Document Revised: 03/26/2019 Document Reviewed: 08/04/2016 Elsevier Patient Education  [place].

## 2019-10-09 ENCOUNTER — Other Ambulatory Visit: Payer: Self-pay | Admitting: Adult Health

## 2019-10-09 LAB — COMPLETE METABOLIC PANEL WITH GFR
AG Ratio: 1.3 (calc) (ref 1.0–2.5)
ALT: 19 U/L (ref 9–46)
AST: 18 U/L (ref 10–35)
Albumin: 4.2 g/dL (ref 3.6–5.1)
Alkaline phosphatase (APISO): 74 U/L (ref 35–144)
BUN: 15 mg/dL (ref 7–25)
CO2: 24 mmol/L (ref 20–32)
Calcium: 10 mg/dL (ref 8.6–10.3)
Chloride: 103 mmol/L (ref 98–110)
Creat: 1 mg/dL (ref 0.70–1.18)
GFR, Est African American: 83 mL/min/{1.73_m2} (ref >=60)
GFR, Est Non African American: 72 mL/min/{1.73_m2} (ref >=60)
Globulin: 3.2 g/dL (calc) (ref 1.9–3.7)
Glucose, Bld: 93 mg/dL (ref 65–99)
Potassium: 3.9 mmol/L (ref 3.5–5.3)
Sodium: 137 mmol/L (ref 135–146)
Total Bilirubin: 0.7 mg/dL (ref 0.2–1.2)
Total Protein: 7.4 g/dL (ref 6.1–8.1)

## 2019-10-09 LAB — CBC WITH DIFFERENTIAL/PLATELET
Absolute Monocytes: 432 cells/uL (ref 200–950)
Basophils Absolute: 42 cells/uL (ref 0–200)
Basophils Relative: 0.9 %
Eosinophils Absolute: 89 cells/uL (ref 15–500)
Eosinophils Relative: 1.9 %
HCT: 37.6 % — ABNORMAL LOW (ref 38.5–50.0)
Hemoglobin: 12.3 g/dL — ABNORMAL LOW (ref 13.2–17.1)
Lymphs Abs: 1015 cells/uL (ref 850–3900)
MCH: 27.4 pg (ref 27.0–33.0)
MCHC: 32.7 g/dL (ref 32.0–36.0)
MCV: 83.7 fL (ref 80.0–100.0)
MPV: 10.3 fL (ref 7.5–12.5)
Monocytes Relative: 9.2 %
Neutro Abs: 3121 cells/uL (ref 1500–7800)
Neutrophils Relative %: 66.4 %
Platelets: 245 10*3/uL (ref 140–400)
RBC: 4.49 10*6/uL (ref 4.20–5.80)
RDW: 14.8 % (ref 11.0–15.0)
Total Lymphocyte: 21.6 %
WBC: 4.7 10*3/uL (ref 3.8–10.8)

## 2019-10-09 LAB — LIPID PANEL
Cholesterol: 161 mg/dL (ref <200)
HDL: 69 mg/dL (ref >=40)
LDL Cholesterol (Calc): 77 mg/dL (calc)
Non-HDL Cholesterol (Calc): 92 mg/dL (calc) (ref <130)
Total CHOL/HDL Ratio: 2.3 (calc) (ref <5.0)
Triglycerides: 66 mg/dL (ref <150)

## 2019-10-09 LAB — TSH: TSH: 1.28 mIU/L (ref 0.40–4.50)

## 2019-10-09 LAB — HEMOGLOBIN A1C
Hgb A1c MFr Bld: 5.8 % of total Hgb — ABNORMAL HIGH (ref <5.7)
Mean Plasma Glucose: 120 (calc)
eAG (mmol/L): 6.6 (calc)

## 2019-10-09 LAB — IRON,TIBC AND FERRITIN PANEL
%SAT: 35 % (calc) (ref 20–48)
Ferritin: 112 ng/mL (ref 24–380)
Iron: 105 ug/dL (ref 50–180)
TIBC: 303 mcg/dL (calc) (ref 250–425)

## 2019-10-09 LAB — MAGNESIUM: Magnesium: 1.8 mg/dL (ref 1.5–2.5)

## 2019-10-10 ENCOUNTER — Other Ambulatory Visit: Payer: Self-pay | Admitting: Adult Health

## 2019-10-15 ENCOUNTER — Telehealth: Payer: Self-pay | Admitting: Adult Health

## 2019-10-15 NOTE — Telephone Encounter (Signed)
patient requested Dr Steffanie Rainwater, podiatrist in [place], [place]. Faxed referral and notified patient via vm.

## 2019-10-19 ENCOUNTER — Other Ambulatory Visit: Payer: Self-pay | Admitting: Internal Medicine

## 2019-10-27 ENCOUNTER — Other Ambulatory Visit: Payer: Self-pay

## 2019-10-27 ENCOUNTER — Ambulatory Visit (INDEPENDENT_AMBULATORY_CARE_PROVIDER_SITE_OTHER): Payer: PPO | Admitting: Podiatrist

## 2019-10-27 VITALS — Temp 95.6°F

## 2019-10-27 DIAGNOSIS — E1151 Type 2 diabetes mellitus with diabetic peripheral angiopathy without gangrene: Secondary | ICD-10-CM

## 2019-10-27 DIAGNOSIS — L84 Corns and callosities: Secondary | ICD-10-CM | POA: Diagnosis not present

## 2019-10-27 DIAGNOSIS — L603 Nail dystrophy: Secondary | ICD-10-CM | POA: Diagnosis not present

## 2019-10-27 NOTE — Patient Instructions (Signed)
Diabetes Mellitus and Foot Care Foot care is an important part of your health, especially when you have diabetes. Diabetes may cause you to have problems because of poor blood flow (circulation) to your feet and legs, which can cause your skin to:  Become thinner and drier.  Break more easily.  Heal more slowly.  Peel and crack. You may also have nerve damage (neuropathy) in your legs and feet, causing decreased feeling in them. This means that you may not notice minor injuries to your feet that could lead to more serious problems. Noticing and addressing any potential problems early is the best way to prevent future foot problems. How to care for your feet Foot hygiene  Wash your feet daily with warm water and mild soap. Do not use hot water. Then, pat your feet and the areas between your toes until they are completely dry. Do not soak your feet as this can dry your skin.  Trim your toenails straight across. Do not dig under them or around the cuticle. File the edges of your nails with an emery board or nail file.  Apply a moisturizing lotion or petroleum jelly to the skin on your feet and to dry, brittle toenails. Use lotion that does not contain alcohol and is unscented. Do not apply lotion between your toes. Shoes and socks  Wear clean socks or stockings every day. Make sure they are not too tight. Do not wear knee-high stockings since they may decrease blood flow to your legs.  Wear shoes that fit properly and have enough cushioning. Always look in your shoes before you put them on to be sure there are no objects inside.  To break in new shoes, wear them for just a few hours a day. This prevents injuries on your feet. Wounds, scrapes, corns, and calluses  Check your feet daily for blisters, cuts, bruises, sores, and redness. If you cannot see the bottom of your feet, use a mirror or ask someone for help.  Do not cut corns or calluses or try to remove them with medicine.  If you  find a minor scrape, cut, or break in the skin on your feet, keep it and the skin around it clean and dry. You may clean these areas with mild soap and water. Do not clean the area with peroxide, alcohol, or iodine.  If you have a wound, scrape, corn, or callus on your foot, look at it several times a day to make sure it is healing and not infected. Check for: ? Redness, swelling, or pain. ? Fluid or blood. ? Warmth. ? Pus or a bad smell. General instructions  Do not cross your legs. This may decrease blood flow to your feet.  Do not use heating pads or hot water bottles on your feet. They may burn your skin. If you have lost feeling in your feet or legs, you may not know this is happening until it is too late.  Protect your feet from hot and cold by wearing shoes, such as at the beach or on hot pavement.  Schedule a complete foot exam at least once a year (annually) or more often if you have foot problems. If you have foot problems, report any cuts, sores, or bruises to your health care provider immediately. Contact a health care provider if:  You have a medical condition that increases your risk of infection and you have any cuts, sores, or bruises on your feet.  You have an injury that is not   healing.  You have redness on your legs or feet.  You feel burning or tingling in your legs or feet.  You have pain or cramps in your legs and feet.  Your legs or feet are numb.  Your feet always feel cold.  You have pain around a toenail. Get help right away if:  You have a wound, scrape, corn, or callus on your foot and: ? You have pain, swelling, or redness that gets worse. ? You have fluid or blood coming from the wound, scrape, corn, or callus. ? Your wound, scrape, corn, or callus feels warm to the touch. ? You have pus or a bad smell coming from the wound, scrape, corn, or callus. ? You have a fever. ? You have a red line going up your leg. Summary  Check your feet every day  for cuts, sores, red spots, swelling, and blisters.  Moisturize feet and legs daily.  Wear shoes that fit properly and have enough cushioning.  If you have foot problems, report any cuts, sores, or bruises to your health care provider immediately.  Schedule a complete foot exam at least once a year (annually) or more often if you have foot problems. This information is not intended to replace advice given to you by your health care provider. Make sure you discuss any questions you have with your health care provider. Document Revised: 03/26/2019 Document Reviewed: 08/04/2016 Elsevier Patient Education  2020 [employer]  

## 2019-11-02 NOTE — Progress Notes (Signed)
  Chief Complaint  Patient presents with  . Diabetes    Diabetic foot check. 09/2019 HgbA1c = 5.8. Pt stated, "I've had diabetes for 7-8 years. No neuropathy. History of some blisters in the past, but no ulcers".  . Nail Problem    Thick, long toenails - bilateral, 1-5.      HPI: Patient is 79 y.o. male who presents today for a new patient diabetic foot evaluation. Relates he has no numbness or sharp pains in his feet.   He also relates long and thick toenails which he would like evaluated and treated today as well.   Review of Systems  DATA OBTAINED: from patient  GENERAL: Feels well no fevers, no fatigue, no changes in appetite SKIN: No itching, no rashes, no open wounds EYES: No eye pain,no redness, no discharge EARS: No earache,no ringing of ears, NOSE: No congestion, no drainage, no bleeding  MOUTH/THROAT: No mouth pain, No sore throat, No difficulty chewing or swallowing  RESPIRATORY: No cough, no wheezing, no SOB CARDIAC: No chest pain,no heart palpitations, GI: No abdominal pain, No Nausea, no vomiting, no diarrhea, no heartburn or no reflux  GU: No dysuria, no increased frequency or urgency MUSCULOSKELETAL: No unrelieved bone/joint pain,  NEUROLOGIC: Awake, alert, appropriate to situation, No change in mental status. PSYCHIATRIC: No overt anxiety or sadness.No behavior issue.      Physical Exam  GENERAL APPEARANCE: Alert, conversant. Appropriately groomed. No acute distress.   VASCULAR: Pedal pulses palpable DP at 2/4 with a non palpable PT pulse bilateral.  Capillary refill time is immediate to all digits,  Proximal to distal cooling it warm to warm.  Digital hair growth is present bilateral   NEUROLOGIC: sensation is intact to 5.07 monofilament at 5/5 sites bilateral.  Light touch is intact bilateral, vibratory sensation intact bilateral, achilles tendon reflex is intact bilateral.   MUSCULOSKELETAL: acceptable muscle strength, tone and stability bilateral.  Intrinsic  muscluature intact bilateral.  Bilateral feet are flattened with a bunion deformity presen ton the right.  Contraction and crowding of digits 2-5 noted bilateral.   DERMATOLOGIC: skin is warm, supple, and dry.  No open lesions noted.  No interdigital maceration noted bilateral. Left second digit has a callus present at the dorsal DIPJ. Digital nails are discolored, dystrophic, thick, with subungual debris present x 10.      Assessment     ICD-10-CM   1. Diabetes mellitus with peripheral vascular disease (HCC)  E11.51   2. Dystrophic nail  L60.3   3.  Callus of toe  Plan:  Discussed diabetes and foot care and information was dispensed to the patient.  Patient is a candidate for at risk foot care due to the diabetes and absent PT pulse as well as the onychomycotic nails.  The nails were debrided for him today and he will return in 3 month intervals for continued care.  The callus will be watched and required no paring today.

## 2019-12-24 ENCOUNTER — Other Ambulatory Visit: Payer: Self-pay | Admitting: Physician Assistant

## 2019-12-25 ENCOUNTER — Ambulatory Visit: Payer: PPO | Admitting: Adult Health

## 2020-01-12 ENCOUNTER — Encounter: Payer: Self-pay | Admitting: Internal Medicine

## 2020-01-12 NOTE — Patient Instructions (Signed)

## 2020-01-12 NOTE — Progress Notes (Signed)
.   Annual  Screening/Preventative Visit  & Comprehensive Evaluation & Examination     This very nice 79 y.o.  DBM  presents for a Screening /Preventative Visit & comprehensive evaluation and management of multiple medical co-morbidities.  Patient has been followed for HTN, HLD, T2_NIDDM  and Vitamin D Deficiency. Patient's Gout is controlled on his meds.     HTN predates since 1989. Patient's BP has been controlled at home.  In 2008, patient had a negative heart cath after a False Positive Cardiolite.Today's BP is borderline high normal diastolic - 387/56. Patient denies any cardiac symptoms as chest pain, palpitations, shortness of breath, dizziness or ankle swelling.     Patient's hyperlipidemia is controlled with diet and Atorvastatin. Patient denies myalgias or other medication SE's. Last lipids were at goal:  Lab Results  Component Value Date   CHOL 161 10/08/2019   HDL 69 10/08/2019   LDLCALC 77 10/08/2019   TRIG 66 10/08/2019   CHOLHDL 2.3 10/08/2019       Patient has moderate obesity (BMI 34.4+) and consequent  T2_NIDDM (2005) with CKD2 and patient denies reactive hypoglycemic symptoms, visual blurring, diabetic polys or paresthesias. Last A1c was at goal:  Lab Results  Component Value Date   HGBA1C 5.8 (H) 10/08/2019        Finally, patient has history of Vitamin D Deficiency ("29" / 2008)  and last vitamin D was at goal:  Lab Results  Component Value Date   VD25OH 79 07/02/2019    Current Outpatient Medications on File Prior to Visit  Medication Sig  . allopurinol (ZYLOPRIM) 300 MG tablet TAKE 1 TABLET BY MOUTH ONCE DAILY FOR GOUT  . amLODipine (NORVASC) 5 MG tablet Take 1 tablet Daily for BP  . Ascorbic Acid (VITAMIN C) 1000 MG tablet Take 1,000 mg by mouth daily.  Marland Kitchen aspirin (ASPIRIN LOW DOSE) 81 MG tablet Take 81 mg by mouth daily.   Marland Kitchen atorvastatin (LIPITOR) 10 MG tablet Take 1 tablet Daily for Cholesterol  . Azelastine HCl 0.15 % SOLN 2 sprays each Nostril 2 x  /day  . blood glucose meter kit and supplies KIT CHECK BLOOD SUGAR 1 TIME DAILY-E11.9  . bumetanide (BUMEX) 2 MG tablet Take 1 tablet by mouth twice daily  . Cholecalciferol (VITAMIN D PO) Take 10,000 Units by mouth.   Marland Kitchen glucose blood (ONETOUCH ULTRA) test strip Test blood sugar once daily  . hyoscyamine (LEVSIN SL) 0.125 MG SL tablet DISSOLVE 1 TABLET IN MOUTH EVERY 4 HOURS AS NEEDED  . IRON PO Take 18 mg by mouth daily.  . Lancets MISC CHECK BLOOD SUGAR 1 TIME DAILY-E11.9  . MAGNESIUM PO Take 500 mg by mouth 2 (two) times daily.   . metFORMIN (GLUCOPHAGE) 500 MG tablet Take 2 tablets 2 x /day with Meals for Diabetes  . mometasone (NASONEX) 50 MCG/ACT nasal spray Place 1 spray into the nose 2 (two) times daily as needed. 1 spray into each nostril.  . montelukast (SINGULAIR) 10 MG tablet TAKE 1 TABLET DAILY FOR ALLERGIES  . zinc gluconate 50 MG tablet Take 50 mg by mouth daily.   No current facility-administered medications on file prior to visit.   Allergies  Allergen Reactions  . Ace Inhibitors    Past Medical History:  Diagnosis Date  . Adenomatous colon polyp    2010  . Gout   . Hyperlipidemia   . Hypertension   . Prostate cancer ([place]) 2010   treated by external beam radiation  .  Type II or unspecified type diabetes mellitus without mention of complication, not stated as uncontrolled   . Vitamin D deficiency    Health Maintenance  Topic Date Due  . Hepatitis C Screening  Never done  . URINE MICROALBUMIN  09/03/2019  . INFLUENZA VACCINE  02/15/2020  . HEMOGLOBIN A1C  04/09/2020  . OPHTHALMOLOGY EXAM  06/30/2020  . FOOT EXAM  01/11/2021  . COLONOSCOPY  07/21/2021  . TETANUS/TDAP  03/21/2022  . COVID-19 Vaccine  Completed  . PNA vac Low Risk Adult  Completed   Immunization History  Administered Date(s) Administered  . DT (Pediatric) 03/21/2012  . Influenza, High Dose Seasonal PF 03/27/2019  . Influenza-Unspecified 04/16/2014, 04/16/2015, 04/16/2018  . Moderna  SARS-COVID-2 Vaccination 08/07/2019, 09/03/2019  . Pneumococcal Conjugate-13 05/19/2014  . Pneumococcal Polysaccharide-23 07/24/2007   Last Colon -  07/21/2014 - Dr Deatra Ina - recommended 7 yr f/u - due Jan 2023  Past Surgical History:  Procedure Laterality Date  . APPENDECTOMY  1994   and GALLBLADDER  . CHOLECYSTECTOMY  1994  . colovesical fistula repair  01/2011  . KNEE SURGERY  1996   Family History  Problem Relation Age of Onset  . Colon cancer Neg Hx   . Esophageal cancer Neg Hx   . Rectal cancer Neg Hx   . Stomach cancer Neg Hx    Social History   Socioeconomic History  . Marital status: Divorced    Spouse name: Not on file  . Number of children: Not on file  Occupational History  . retired  Tobacco Use  . Smoking status: Former Smoker    Quit date: 07/17/1985    Years since quitting: 34.5  . Smokeless tobacco: Never Used  Substance and Sexual Activity  . Alcohol use: No    Alcohol/week: 0.0 standard drinks  . Drug use: No  . Sexual activity: Not on file   ROS Constitutional: Denies fever, chills, weight loss/gain, headaches, insomnia,  night sweats or change in appetite. Does c/o fatigue. Eyes: Denies redness, blurred vision, diplopia, discharge, itchy or watery eyes.  ENT: Denies discharge, congestion, post nasal drip, epistaxis, sore throat, earache, hearing loss, dental pain, Tinnitus, Vertigo, Sinus pain or snoring.  Cardio: Denies chest pain, palpitations, irregular heartbeat, syncope, dyspnea, diaphoresis, orthopnea, PND, claudication or edema Respiratory: denies cough, dyspnea, DOE, pleurisy, hoarseness, laryngitis or wheezing.  Gastrointestinal: Denies dysphagia, heartburn, reflux, water brash, pain, cramps, nausea, vomiting, bloating, diarrhea, constipation, hematemesis, melena, hematochezia, jaundice or hemorrhoids Genitourinary: Denies dysuria, frequency, urgency, nocturia, hesitancy, discharge, hematuria or flank pain Musculoskeletal: Denies arthralgia,  myalgia, stiffness, Jt. Swelling, pain, limp or strain/sprain. Denies Falls. Skin: Denies puritis, rash, hives, warts, acne, eczema or change in skin lesion Neuro: No weakness, tremor, incoordination, spasms, paresthesia or pain Psychiatric: Denies confusion, memory loss or sensory loss. Denies Depression. Endocrine: Denies change in weight, skin, hair change, nocturia, and paresthesia, diabetic polys, visual blurring or hyper / hypo glycemic episodes.  Heme/Lymph: No excessive bleeding, bruising or enlarged lymph nodes.  Physical Exam  BP 138/90   Pulse 80   Temp (!) 97.3 F (36.3 C)   Resp 16   Ht _0  (1.88 m)   Wt 273 lb 3.2 oz (123.9 kg)   BMI 35.08 kg/m   General Appearance: Well nourished and well groomed and in no apparent distress.  Eyes: PERRLA, EOMs, conjunctiva no swelling or erythema, normal fundi and vessels. Sinuses: No frontal/maxillary tenderness ENT/Mouth: EACs patent / TMs  nl. Nares clear without erythema, swelling, mucoid exudates.  Oral hygiene is good. No erythema, swelling, or exudate. Tongue normal, non-obstructing. Tonsils not swollen or erythematous. Hearing normal.  Neck: Supple, thyroid not palpable. No bruits, nodes or JVD. Respiratory: Respiratory effort normal.  BS equal and clear bilateral without rales, rhonci, wheezing or stridor. Cardio: Heart sounds are normal with regular rate and rhythm and no murmurs, rubs or gallops. Peripheral pulses are normal and equal bilaterally without edema. No aortic or femoral bruits. Chest: symmetric with normal excursions and percussion.  Abdomen: Soft, with Nl bowel sounds. Nontender, no guarding, rebound, hernias, masses, or organomegaly.  Lymphatics: Non tender without lymphadenopathy.  Musculoskeletal: Full ROM all peripheral extremities, joint stability, 5/5 strength, and normal gait. Skin: Warm and dry without rashes, lesions, cyanosis, clubbing or  ecchymosis.  Neuro: Cranial nerves intact, reflexes equal  bilaterally. Normal muscle tone, no cerebellar symptoms. Sensation intact.  Pysch: Alert and oriented X 3 with normal affect, insight and judgment appropriate.   Assessment and Plan  1. Annual Preventative/Screening Exam   2. Essential hypertension  - EKG 12-Lead - [place], RETROPERITNL ABD,  LTD - Urinalysis, Routine w reflex microscopic - Microalbumin / creatinine urine ratio - CBC with Differential/Platelet - COMPLETE METABOLIC PANEL WITH GFR - Magnesium - TSH  3. Hyperlipidemia associated with type 2 diabetes mellitus ([place])  - EKG 12-Lead - [place], RETROPERITNL ABD,  LTD - Lipid panel - TSH  4. Type 2 diabetes mellitus with stage 2 chronic kidney disease,  without long-term current use of insulin (HCC)  - EKG 12-Lead - [place], RETROPERITNL ABD,  LTD - HM DIABETES FOOT EXAM - LOW EXTREMITY NEUR EXAM DOCUM - Hemoglobin A1c - Insulin, random  5. Vitamin D deficiency   6. Idiopathic gout  - Uric acid  7. BPH with obstruction/lower urinary tract symptoms  - PSA  8. H/O prostate cancer (2006)  - PSA  9. Prostate cancer screening  - PSA  10. Screening for colorectal cancer  - POC Hemoccult Bld/Stl   11. Screening for ischemic heart disease  - EKG 12-Lead - [place], RETROPERITNL ABD,  LTD  12. Family history of ischemic heart disease  13. Former smoker  44. Screening for AAA (aortic abdominal aneurysm)  - Urinalysis, Routine w reflex microscopic - Microalbumin / creatinine urine ratio - CBC with Differential/Platelet - COMPLETE METABOLIC PANEL WITH GFR - Magnesium - Lipid panel - TSH - Hemoglobin A1c - Insulin, random - VITAMIN D 25 Hydroxy        Patient was counseled in prudent diet, weight control to achieve/maintain BMI less than 25, BP monitoring, regular exercise and medications as discussed.  Discussed med effects and SE's. Routine screening labs and tests as requested with regular follow-up as recommended. Over 40 minutes of exam, counseling, chart  review and high complex critical decision making was performed   Kirtland Bouchard, MD

## 2020-01-13 ENCOUNTER — Ambulatory Visit: Payer: PPO | Admitting: Internal Medicine

## 2020-01-13 ENCOUNTER — Other Ambulatory Visit: Payer: Self-pay

## 2020-01-13 VITALS — BP 138/90 | HR 80 | Temp 97.3°F | Resp 16 | Ht 74.0 in | Wt 273.2 lb

## 2020-01-13 DIAGNOSIS — M1000 Idiopathic gout, unspecified site: Secondary | ICD-10-CM

## 2020-01-13 DIAGNOSIS — I10 Essential (primary) hypertension: Secondary | ICD-10-CM

## 2020-01-13 DIAGNOSIS — Z125 Encounter for screening for malignant neoplasm of prostate: Secondary | ICD-10-CM | POA: Diagnosis not present

## 2020-01-13 DIAGNOSIS — E785 Hyperlipidemia, unspecified: Secondary | ICD-10-CM | POA: Diagnosis not present

## 2020-01-13 DIAGNOSIS — N138 Other obstructive and reflux uropathy: Secondary | ICD-10-CM

## 2020-01-13 DIAGNOSIS — E1122 Type 2 diabetes mellitus with diabetic chronic kidney disease: Secondary | ICD-10-CM

## 2020-01-13 DIAGNOSIS — E559 Vitamin D deficiency, unspecified: Secondary | ICD-10-CM

## 2020-01-13 DIAGNOSIS — Z8546 Personal history of malignant neoplasm of prostate: Secondary | ICD-10-CM

## 2020-01-13 DIAGNOSIS — E1169 Type 2 diabetes mellitus with other specified complication: Secondary | ICD-10-CM | POA: Diagnosis not present

## 2020-01-13 DIAGNOSIS — Z8249 Family history of ischemic heart disease and other diseases of the circulatory system: Secondary | ICD-10-CM

## 2020-01-13 DIAGNOSIS — Z136 Encounter for screening for cardiovascular disorders: Secondary | ICD-10-CM

## 2020-01-13 DIAGNOSIS — Z1211 Encounter for screening for malignant neoplasm of colon: Secondary | ICD-10-CM

## 2020-01-13 DIAGNOSIS — N182 Chronic kidney disease, stage 2 (mild): Secondary | ICD-10-CM | POA: Diagnosis not present

## 2020-01-13 DIAGNOSIS — N401 Enlarged prostate with lower urinary tract symptoms: Secondary | ICD-10-CM | POA: Diagnosis not present

## 2020-01-13 DIAGNOSIS — Z87891 Personal history of nicotine dependence: Secondary | ICD-10-CM

## 2020-01-13 DIAGNOSIS — Z0001 Encounter for general adult medical examination with abnormal findings: Secondary | ICD-10-CM

## 2020-01-13 MED ORDER — LANCETS MISC: 1 refills | Status: AC

## 2020-01-14 LAB — COMPLETE METABOLIC PANEL WITH GFR
AG Ratio: 1.3 (calc) (ref 1.0–2.5)
ALT: 15 U/L (ref 9–46)
AST: 17 U/L (ref 10–35)
Albumin: 4.3 g/dL (ref 3.6–5.1)
Alkaline phosphatase (APISO): 71 U/L (ref 35–144)
BUN: 16 mg/dL (ref 7–25)
CO2: 24 mmol/L (ref 20–32)
Calcium: 10.4 mg/dL — ABNORMAL HIGH (ref 8.6–10.3)
Chloride: 105 mmol/L (ref 98–110)
Creat: 1.02 mg/dL (ref 0.70–1.18)
GFR, Est African American: 81 mL/min/{1.73_m2} (ref >=60)
GFR, Est Non African American: 70 mL/min/{1.73_m2} (ref >=60)
Globulin: 3.4 g/dL (calc) (ref 1.9–3.7)
Glucose, Bld: 114 mg/dL — ABNORMAL HIGH (ref 65–99)
Potassium: 4.2 mmol/L (ref 3.5–5.3)
Sodium: 139 mmol/L (ref 135–146)
Total Bilirubin: 0.5 mg/dL (ref 0.2–1.2)
Total Protein: 7.7 g/dL (ref 6.1–8.1)

## 2020-01-14 LAB — URINALYSIS, ROUTINE W REFLEX MICROSCOPIC
Bilirubin Urine: NEGATIVE
Glucose, UA: NEGATIVE
Hgb urine dipstick: NEGATIVE
Leukocytes,Ua: NEGATIVE
Nitrite: NEGATIVE
Protein, ur: NEGATIVE
Specific Gravity, Urine: 1.025 (ref 1.001–1.03)
pH: 5.5 (ref 5.0–8.0)

## 2020-01-14 LAB — CBC WITH DIFFERENTIAL/PLATELET
Absolute Monocytes: 494 cells/uL (ref 200–950)
Basophils Absolute: 61 cells/uL (ref 0–200)
Basophils Relative: 1 %
Eosinophils Absolute: 128 cells/uL (ref 15–500)
Eosinophils Relative: 2.1 %
HCT: 38.5 % (ref 38.5–50.0)
Hemoglobin: 12.6 g/dL — ABNORMAL LOW (ref 13.2–17.1)
Lymphs Abs: 1568 cells/uL (ref 850–3900)
MCH: 27.2 pg (ref 27.0–33.0)
MCHC: 32.7 g/dL (ref 32.0–36.0)
MCV: 83 fL (ref 80.0–100.0)
MPV: 10.8 fL (ref 7.5–12.5)
Monocytes Relative: 8.1 %
Neutro Abs: 3849 cells/uL (ref 1500–7800)
Neutrophils Relative %: 63.1 %
Platelets: 242 10*3/uL (ref 140–400)
RBC: 4.64 10*6/uL (ref 4.20–5.80)
RDW: 14.4 % (ref 11.0–15.0)
Total Lymphocyte: 25.7 %
WBC: 6.1 10*3/uL (ref 3.8–10.8)

## 2020-01-14 LAB — LIPID PANEL
Cholesterol: 157 mg/dL (ref <200)
HDL: 68 mg/dL (ref >=40)
LDL Cholesterol (Calc): 75 mg/dL (calc)
Non-HDL Cholesterol (Calc): 89 mg/dL (calc) (ref <130)
Total CHOL/HDL Ratio: 2.3 (calc) (ref <5.0)
Triglycerides: 65 mg/dL (ref <150)

## 2020-01-14 LAB — MICROALBUMIN / CREATININE URINE RATIO
Creatinine, Urine: 202 mg/dL (ref 20–320)
Microalb Creat Ratio: 5 mcg/mg creat (ref <30)
Microalb, Ur: 1.1 mg/dL

## 2020-01-14 LAB — URIC ACID: Uric Acid, Serum: 3.6 mg/dL — ABNORMAL LOW (ref 4.0–8.0)

## 2020-01-14 LAB — TSH: TSH: 1.44 mIU/L (ref 0.40–4.50)

## 2020-01-14 LAB — HEMOGLOBIN A1C
Hgb A1c MFr Bld: 5.9 % of total Hgb — ABNORMAL HIGH (ref <5.7)
Mean Plasma Glucose: 123 (calc)
eAG (mmol/L): 6.8 (calc)

## 2020-01-14 LAB — MAGNESIUM: Magnesium: 1.9 mg/dL (ref 1.5–2.5)

## 2020-01-14 LAB — VITAMIN D 25 HYDROXY (VIT D DEFICIENCY, FRACTURES): Vit D, 25-Hydroxy: 85 ng/mL (ref 30–100)

## 2020-01-14 LAB — PSA: PSA: 0.2 ng/mL (ref <=4.0)

## 2020-01-14 LAB — INSULIN, RANDOM: Insulin: 14.6 u[IU]/mL

## 2020-01-20 ENCOUNTER — Other Ambulatory Visit: Payer: Self-pay | Admitting: Physician Assistant

## 2020-01-26 ENCOUNTER — Ambulatory Visit: Payer: PPO | Admitting: Podiatry

## 2020-01-26 ENCOUNTER — Other Ambulatory Visit: Payer: Self-pay

## 2020-01-26 ENCOUNTER — Encounter: Payer: Self-pay | Admitting: Podiatry

## 2020-01-26 DIAGNOSIS — B351 Tinea unguium: Secondary | ICD-10-CM

## 2020-01-26 DIAGNOSIS — M79674 Pain in right toe(s): Secondary | ICD-10-CM | POA: Diagnosis not present

## 2020-01-26 DIAGNOSIS — L84 Corns and callosities: Secondary | ICD-10-CM

## 2020-01-26 DIAGNOSIS — M79675 Pain in left toe(s): Secondary | ICD-10-CM

## 2020-01-26 DIAGNOSIS — E1151 Type 2 diabetes mellitus with diabetic peripheral angiopathy without gangrene: Secondary | ICD-10-CM | POA: Diagnosis not present

## 2020-01-26 NOTE — Progress Notes (Signed)
Subjective:  Patient ID: Marcus Chambers, male    DOB: 1940/08/21,  MRN: [id number]  79 y.o. male presents with at risk foot care. Pt has h/o NIDDM with PAD and painful corn(s) left 2nd and painful thick toenails that are difficult to trim. Pain interferes with ambulation. Aggravating factors include wearing enclosed shoe gear. Pain is relieved with periodic professional debridement.   PCP is Dr. [name] and last visit was 01/13/2020.  Marcus Chambers voices no new pedal concerns on today's visit.  Review of Systems: Negative except as noted in the HPI. Denies N/V/F/Ch. Past Medical History:  Diagnosis Date  . Adenomatous colon polyp    2010  . Gout   . Hyperlipidemia   . Hypertension   . Prostate cancer ([place]) 2010   treated by external beam radiation  . Type II or unspecified type diabetes mellitus without mention of complication, not stated as uncontrolled   . Vitamin D deficiency    Past Surgical History:  Procedure Laterality Date  . APPENDECTOMY  1994   and GALLBLADDER  . CHOLECYSTECTOMY  1994  . colovesical fistula repair  01/2011  . KNEE SURGERY  1996   Patient Active Problem List   Diagnosis Date Noted  . ACEI/ARB contraindicated 06/03/2018  . CKD stage 2 due to type 2 diabetes mellitus ([place]) 11/26/2017  . Anxiety 11/26/2017  . Hypertensive retinopathy 05/10/2017  . Obesity (BMI 30.0-34.9) 08/25/2014  . Medication management 10/14/2013  . H/O prostate cancer (2006) 10/14/2013  . Essential hypertension 09/05/2013  . Type 2 diabetes mellitus ([place])   . Hyperlipidemia associated with type 2 diabetes mellitus ([place])   . Gout   . Vitamin D deficiency   . History of colonic polyps 09/16/2009    Current Outpatient Medications:  .  allopurinol (ZYLOPRIM) 300 MG tablet, Take 1 tablet daily to Prevent Gout, Disp: 90 tablet, Rfl: 0 .  amLODipine (NORVASC) 5 MG tablet, Take 1 tablet Daily for BP, Disp: 90 tablet, Rfl: 3 .  Ascorbic Acid (VITAMIN C) 1000 MG tablet, Take  1,000 mg by mouth daily., Disp: , Rfl:  .  aspirin (ASPIRIN LOW DOSE) 81 MG tablet, Take 81 mg by mouth daily. , Disp: , Rfl:  .  atorvastatin (LIPITOR) 10 MG tablet, Take 1 tablet Daily for Cholesterol, Disp: 90 tablet, Rfl: 0 .  Azelastine HCl 0.15 % SOLN, 2 sprays each Nostril 2 x /day, Disp: 90 mL, Rfl: 3 .  blood glucose meter kit and supplies KIT, CHECK BLOOD SUGAR 1 TIME DAILY-E11.9, Disp: 1 each, Rfl: 0 .  bumetanide (BUMEX) 2 MG tablet, Take 1 tablet by mouth twice daily, Disp: 180 tablet, Rfl: 1 .  Cholecalciferol (VITAMIN D PO), Take 10,000 Units by mouth. , Disp: , Rfl:  .  glucose blood (ONETOUCH ULTRA) test strip, Test blood sugar once daily, Disp: 100 each, Rfl: 12 .  hyoscyamine (LEVSIN SL) 0.125 MG SL tablet, DISSOLVE 1 TABLET IN MOUTH EVERY 4 HOURS AS NEEDED, Disp: 60 tablet, Rfl: 1 .  IRON PO, Take 18 mg by mouth daily., Disp: , Rfl:  .  Lancets MISC, CHECK BLOOD SUGAR 1 TIME DAILY-E11.9, Disp: 100 each, Rfl: 1 .  MAGNESIUM PO, Take 500 mg by mouth 2 (two) times daily. , Disp: , Rfl:  .  metFORMIN (GLUCOPHAGE) 500 MG tablet, Take 2 tablets 2 x /day with Meals for Diabetes, Disp: 360 tablet, Rfl: 3 .  mometasone (NASONEX) 50 MCG/ACT nasal spray, Place 1 spray into the nose 2 (two)  times daily as needed. 1 spray into each nostril., Disp: 17 g, Rfl: 2 .  montelukast (SINGULAIR) 10 MG tablet, TAKE 1 TABLET DAILY FOR ALLERGIES, Disp: 90 tablet, Rfl: 1 .  zinc gluconate 50 MG tablet, Take 50 mg by mouth daily., Disp: , Rfl:  Allergies  Allergen Reactions  . Ace Inhibitors    Social History   Tobacco Use  Smoking Status Former Smoker  . Quit date: 07/17/1985  . Years since quitting: 34.5  Smokeless Tobacco Never Used   Objective:  There were no vitals filed for this visit. Constitutional Patient is a pleasant 79 y.o. African American male, morbidly obese in NAD.  Vascular Capillary refill time to digits immediate b/l. Palpable DP pulse(s) b/l lower extremities Nonpalpable PT  pulse(s) b/l lower extremities. Pedal hair present. Lower extremity skin temperature gradient within normal limits. No pain with calf compression b/l. No cyanosis or clubbing noted.  Neurologic Normal speech. Oriented to person, place, and time. Protective sensation intact 5/5 intact bilaterally with 10g monofilament b/l. Vibratory sensation intact b/l. Proprioception intact bilaterally. Clonus negative b/l.  Dermatologic Pedal skin with normal turgor, texture and tone bilaterally. No open wounds bilaterally. No interdigital macerations bilaterally. Toenails 1-5 b/l elongated, discolored, dystrophic, thickened, crumbly with subungual debris and tenderness to dorsal palpation. Hyperkeratotic lesion(s) L 2nd toe.  No erythema, no edema, no drainage, no flocculence.  Orthopedic: Normal muscle strength 5/5 to all lower extremity muscle groups bilaterally. No pain crepitus or joint limitation noted with ROM b/l. Hallux valgus with bunion deformity noted right lower extremity. Hammertoes noted to the 2-5 bilaterally. Pes planus deformity noted b/l.    Radiographs: None Assessment:   1. Pain due to onychomycosis of toenails of both feet   2. Corns   3. Diabetes mellitus with peripheral vascular disease ([place])    Plan:  Patient was evaluated and treated and all questions answered.  Onychomycosis with pain -Nails palliatively debridement as below. -Educated on self-care  Procedure: Nail Debridement Rationale: Pain Type of Debridement: manual, sharp debridement. Instrumentation: Nail nipper, rotary burr. Number of Nails: 10  -Examined patient. -Continue diabetic foot care principles. -Toenails 1-5 b/l were debrided in length and girth with sterile nail nippers and dremel without iatrogenic bleeding.  -Corn(s) L 2nd toe pared utilizing sterile scalpel blade without complication or incident. Total number debrided=1. -Patient to continue soft, supportive shoe gear daily. -Patient/POA to call  should there be question/concern in the interim.  Return in about 3 months (around 04/27/2020) for diabetic nail and callus trim.  Marcus Chambers, DPM

## 2020-02-17 ENCOUNTER — Other Ambulatory Visit: Payer: Self-pay | Admitting: Internal Medicine

## 2020-02-19 ENCOUNTER — Other Ambulatory Visit: Payer: Self-pay | Admitting: *Deleted

## 2020-02-19 DIAGNOSIS — Z1212 Encounter for screening for malignant neoplasm of rectum: Secondary | ICD-10-CM | POA: Diagnosis not present

## 2020-02-19 DIAGNOSIS — Z1211 Encounter for screening for malignant neoplasm of colon: Secondary | ICD-10-CM | POA: Diagnosis not present

## 2020-02-19 LAB — POC HEMOCCULT BLD/STL (HOME/3-CARD/SCREEN)
Card #2 Fecal Occult Blod, POC: NEGATIVE
Card #3 Fecal Occult Blood, POC: NEGATIVE
Fecal Occult Blood, POC: NEGATIVE

## 2020-04-11 ENCOUNTER — Other Ambulatory Visit: Payer: Self-pay | Admitting: Internal Medicine

## 2020-04-14 NOTE — Progress Notes (Signed)
3 MONTH FOLLOW UP Assessment:    Essential hypertension Continue bumetanide, try reducing amlodipine to 2.5 mg due to very aggressive control per home log -resume whole tab if persistent elevation above goal  Monitor blood pressure at home; call if consistently over 130/80 Continue DASH diet.   Reminder to go to the ER if any CP, SOB, nausea, dizziness, severe HA, changes vision/speech, left arm numbness and tingling and jaw pain.  Type 2 diabetes mellitus with stage 2 chronic kidney disease, without long-term current use of insulin [facility]) Education: Reviewed 'ABCs' of diabetes management (respective goals in parentheses):  A1C (<7), blood pressure (<130/80), and cholesterol (LDL <70) Eye Exam yearly and Dental Exam every 6 months. Dietary recommendations Physical Activity recommendations Foot exam UTD  CKD stage 2 due to type 2 diabetes mellitus (HCC) Increase fluids, avoid NSAIDS, monitor sugars, will monitor  Vitamin D deficiency At goal at recent check; continue to recommend supplementation for goal of 60-100 Defer vitamin D level  Obesity (BMI 34)  Long discussion about weight loss, diet, and exercise Recommended diet heavy in fruits and veggies and low in animal meats, cheeses, and dairy products, appropriate calorie intake He will work on cutting down on sweets Discussed appropriate weight for height  - next weight goal of 230 lb, long term goal <190lb per patient Follow up at next visit  Medication management CBC, CMP/GFR, magnesium   Mixed hyperlipidemia Continue medications; currently above goal -will increase to 20 mg daily  LDL goal <70  Continue low cholesterol diet and exercise.  Check lipid panel.   Idiopathic gout, unspecified chronicity, unspecified site Continue allopurinol Diet discussed Check uric acid as needed  Anxiety Successfully tapered off of benzo; reports managing fairly well Stress management techniques discussed, increase water, good sleep  hygiene discussed, increase exercise, and increase veggies.     Over 30 minutes of exam, counseling, chart review, and critical decision making was performed  Future Appointments  Date Time Provider [place]  04/26/2020  9:00 AM Marzetta Board, DPM [facility]  07/21/2020 11:30 AM [name], MD [facility] None  01/13/2021 11:00 AM [name], NP [facility] None     Subjective:  Marcus Chambers is a 79 y.o. male who presents for  3 month follow up for HTN, hyperlipidemia, T2DM, and vitamin D Def.   he has situational anxiety with driving into town, hx of insomnia, was on xanax but has successfully tapered off this year and reports doing fairly well.   BMI is Body mass index is 34.13 kg/m., he has been working on diet, plans to join [employer] eventually, currently walking on treadmill 30 min 3-4 days/week.  Wt Readings from Last 3 Encounters:  04/15/20 265 lb 12.8 oz (120.6 kg)  01/13/20 273 lb 3.2 oz (123.9 kg)  10/08/19 268 lb (121.6 kg)   His blood pressure has been controlled at home (90-low 110s/60s), today their BP is BP: 138/82 - reports typical after driving on [employer].  He does workout. He denies chest pain, shortness of breath, dizziness.   He is on cholesterol medication (atorvastatin 10 mg daily) and denies myalgias. His cholesterol is not at goal of LDL <70. The cholesterol last visit was:   Lab Results  Component Value Date   CHOL 157 01/13/2020   HDL 68 01/13/2020   LDLCALC 75 01/13/2020   TRIG 65 01/13/2020   CHOLHDL 2.3 01/13/2020   He has been working on diet and exercise for T2 diabetes controlled  on metformin 2000 mg daily, and denies foot ulcerations, increased appetite, nausea, paresthesia of the feet, polydipsia, polyuria, visual disturbances, vomiting and weight loss.  Fasting glucose typically in 110 range Last A1C in the office was:  Lab Results  Component Value Date   HGBA1C 5.9 (H) 01/13/2020   He  has CKD II associated with T2DM monitored at this office, ACI/ARB contraindicated per chart but patient unsure why:  Lab Results  Component Value Date   GFRAA 81 01/13/2020    Patient is on Vitamin D supplement and at goal at recent check:   Lab Results  Component Value Date   VD25OH 85 01/13/2020     Patient is on allopurinol for gout and does not report a recent flare.  Lab Results  Component Value Date   LABURIC 3.6 (L) 01/13/2020   He has hx of prostate cancer circa 2010, underwent radiation, was followed annually by [facility], last visit 06/25/2018 but now monitoring at this office  PSAs have been stable, denies urinary sx.  Lab Results  Component Value Date   PSA 0.2 01/13/2020   PSA 0.3 09/02/2018   PSA 0.2 08/16/2017    Medication Review:  Current Outpatient Medications (Endocrine & Metabolic):  .  metFORMIN (GLUCOPHAGE) 500 MG tablet, Take 2 tablets 2 x /day with Meals for Diabetes  Current Outpatient Medications (Cardiovascular):  .  amLODipine (NORVASC) 5 MG tablet, Take 1/2-1 tablet Daily for BP .  atorvastatin (LIPITOR) 20 MG tablet, Take 1 tablet     Daily     for Cholesterol .  bumetanide (BUMEX) 2 MG tablet, Take 1 tablet by mouth twice daily  Current Outpatient Medications (Respiratory):  Marland Kitchen  Azelastine HCl 0.15 % SOLN, 2 sprays each Nostril 2 x /day .  mometasone (NASONEX) 50 MCG/ACT nasal spray, Place 1 spray into the nose 2 (two) times daily as needed. 1 spray into each nostril. .  montelukast (SINGULAIR) 10 MG tablet, Take    1 tablet       Daily    for Allergies  Current Outpatient Medications (Analgesics):  .  allopurinol (ZYLOPRIM) 300 MG tablet, Take 1 tablet daily to Prevent Gout .  aspirin (ASPIRIN LOW DOSE) 81 MG tablet, Take 81 mg by mouth daily.    Current Outpatient Medications (Other):  Marland Kitchen  Ascorbic Acid (VITAMIN C) 1000 MG tablet, Take 1,000 mg by mouth daily. .  blood glucose meter kit and supplies KIT, CHECK BLOOD SUGAR 1 TIME  DAILY-E11.9 .  Cholecalciferol (VITAMIN D PO), Take 10,000 Units by mouth.  Marland Kitchen  glucose blood (ONETOUCH ULTRA) test strip, Test blood sugar once daily .  hyoscyamine (LEVSIN SL) 0.125 MG SL tablet, DISSOLVE 1 TABLET IN MOUTH EVERY 4 HOURS AS NEEDED .  Lancets MISC, CHECK BLOOD SUGAR 1 TIME DAILY-E11.9 .  MAGNESIUM PO, Take 500 mg by mouth 2 (two) times daily.  Marland Kitchen  zinc gluconate 50 MG tablet, Take 50 mg by mouth daily.  Allergies: Allergies  Allergen Reactions  . Ace Inhibitors     Current Problems (verified) has History of colonic polyps; Type 2 diabetes mellitus ([place]); Hyperlipidemia associated with type 2 diabetes mellitus ([place]); Gout; Vitamin D deficiency; Essential hypertension; Medication management; H/O prostate cancer (2006); Obesity (BMI 30.0-34.9); Hypertensive retinopathy; CKD stage 2 due to type 2 diabetes mellitus ([place]); Situational anxiety ; and ACEI/ARB contraindicated on their problem list.  Surgical: He  has a past surgical history that includes Appendectomy (1994); Cholecystectomy (1994); Knee surgery (1996); and colovesical  fistula repair (01/2011). Family His family history is not on file. Social history  He reports that he quit smoking about 34 years ago. He has never used smokeless tobacco. He reports that he does not drink alcohol and does not use drugs.   Review of Systems  Constitutional: Negative for malaise/fatigue and weight loss.  HENT: Negative for hearing loss and tinnitus.   Eyes: Negative for blurred vision and double vision.  Respiratory: Negative for cough, shortness of breath and wheezing.   Cardiovascular: Negative for chest pain, palpitations, orthopnea, claudication and leg swelling.  Gastrointestinal: Negative for abdominal pain, blood in stool, constipation, diarrhea, heartburn, melena, nausea and vomiting.  Genitourinary: Negative.   Musculoskeletal: Negative for joint pain and myalgias.  Skin: Negative for rash.  Neurological: Negative for  dizziness, tingling, sensory change, weakness and headaches.  Endo/Heme/Allergies: Negative for polydipsia.  Psychiatric/Behavioral: Negative.   All other systems reviewed and are negative.    Objective:   Today's Vitals   04/15/20 1051 04/15/20 1119  BP: (!) 160/90 138/82  Pulse: 65   Temp: (!) 96.1 F (35.6 C)   SpO2: 97%   Weight: 265 lb 12.8 oz (120.6 kg)   Height: '6\' 2"'  (1.88 m)    Body mass index is 34.13 kg/m.  General appearance: alert, no distress, WD/WN, obese male HEENT: normocephalic, sclerae anicteric, TMs pearly, nares patent, no discharge or erythema, pharynx normal Oral cavity: MMM, no lesions Neck: supple, no lymphadenopathy, no thyromegaly, no masses Heart: RRR, normal S1, S2, no murmurs Lungs: CTA bilaterally, no wheezes, rhonchi, or rales Abdomen: +bs, soft, pendulous, non tender, non distended, no masses, no hepatomegaly, no splenomegaly Musculoskeletal: nontender, no swelling, no obvious deformity Extremities: no edema, no cyanosis, no clubbing.  Pulses: 2+ symmetric, upper and lower extremities, normal cap refill Neurological: alert, oriented x 3, CN2-12 intact, strength normal upper extremities and lower extremities, sensation normal throughout, DTRs 2+ throughout, no cerebellar signs, gait slow steady Psychiatric: normal affect, behavior normal, pleasant    Izora Ribas, NP   04/15/2020

## 2020-04-15 ENCOUNTER — Encounter: Payer: Self-pay | Admitting: Adult Health

## 2020-04-15 ENCOUNTER — Ambulatory Visit (INDEPENDENT_AMBULATORY_CARE_PROVIDER_SITE_OTHER): Payer: PPO | Admitting: Adult Health

## 2020-04-15 ENCOUNTER — Other Ambulatory Visit: Payer: Self-pay

## 2020-04-15 VITALS — BP 138/82 | HR 65 | Temp 96.1°F | Ht 74.0 in | Wt 265.8 lb

## 2020-04-15 DIAGNOSIS — M1000 Idiopathic gout, unspecified site: Secondary | ICD-10-CM

## 2020-04-15 DIAGNOSIS — I10 Essential (primary) hypertension: Secondary | ICD-10-CM

## 2020-04-15 DIAGNOSIS — E785 Hyperlipidemia, unspecified: Secondary | ICD-10-CM

## 2020-04-15 DIAGNOSIS — F418 Other specified anxiety disorders: Secondary | ICD-10-CM

## 2020-04-15 DIAGNOSIS — Z79899 Other long term (current) drug therapy: Secondary | ICD-10-CM | POA: Diagnosis not present

## 2020-04-15 DIAGNOSIS — E1122 Type 2 diabetes mellitus with diabetic chronic kidney disease: Secondary | ICD-10-CM

## 2020-04-15 DIAGNOSIS — E1169 Type 2 diabetes mellitus with other specified complication: Secondary | ICD-10-CM

## 2020-04-15 DIAGNOSIS — E669 Obesity, unspecified: Secondary | ICD-10-CM

## 2020-04-15 DIAGNOSIS — N182 Chronic kidney disease, stage 2 (mild): Secondary | ICD-10-CM

## 2020-04-15 DIAGNOSIS — E559 Vitamin D deficiency, unspecified: Secondary | ICD-10-CM | POA: Diagnosis not present

## 2020-04-15 MED ORDER — AMLODIPINE BESYLATE 5 MG PO TABS: ORAL_TABLET | ORAL | 3 refills | Status: DC

## 2020-04-15 MED ORDER — ATORVASTATIN CALCIUM 20 MG PO TABS: ORAL_TABLET | ORAL | 1 refills | Status: DC

## 2020-04-15 NOTE — Patient Instructions (Addendum)
Goals    . Exercise 150 min/wk Moderate Activity    . LDL CALC < 70    . Weight (lb) < 230 lb (104.3 kg)       Try reducing amlodipine to 1/2 tab if blood pressures remain low If they come up above 130/80 then resume whole tab   Double up on atorvastatin 10 mg tabs until you run out Then start 1 tab daily of atorvastatin 20 mg  Continue to increase walking, watch portions Increase high fiber foods -      High-Fiber Diet Fiber, also called dietary fiber, is a type of carbohydrate that is found in fruits, vegetables, whole grains, and beans. A high-fiber diet can have many health benefits. Your health care provider may recommend a high-fiber diet to help:  Prevent constipation. Fiber can make your bowel movements more regular.  Lower your cholesterol.  Prevent overeating as part of a weight-loss plan.  Prevent heart disease, type 2 diabetes, and certain cancers.  What is my plan? The recommended daily fiber intake in grams (g) includes:  38 g for men age 22 or younger.  30 g for men over age 77.  66 g for women age 74 or younger.  21 g for women over age 67. You can get the recommended daily intake of dietary fiber by:  Eating a variety of fruits, vegetables, grains, and beans.  Taking a fiber supplement, if it is not possible to get enough fiber through your diet. What do I need to know about a high-fiber diet?  It is better to get fiber through food sources rather than from fiber supplements. There is not a lot of research about how effective supplements are.  Always check the fiber content on the nutrition facts label of any prepackaged food. Look for foods that contain 5 g of fiber or more per serving.  Talk with a diet and nutrition specialist (dietitian) if you have questions about specific foods that are recommended or not recommended for your medical condition, especially if those foods are not listed below.  Gradually increase how much fiber you consume. If  you increase your intake of dietary fiber too quickly, you may have bloating, cramping, or gas.  Drink plenty of water. Water helps you to digest fiber. What are tips for following this plan?  Eat a wide variety of high-fiber foods.  Make sure that half of the grains that you eat each day are whole grains.  Eat breads and cereals that are made with whole-grain flour instead of refined flour or white flour.  Eat brown rice, bulgur wheat, or millet instead of white rice.  Start the day with a breakfast that is high in fiber, such as a cereal that contains 5 g of fiber or more per serving.  Use beans in place of meat in soups, salads, and pasta dishes.  Eat high-fiber snacks, such as berries, raw vegetables, nuts, and popcorn.  Choose whole fruits and vegetables instead of processed forms like juice or sauce. What foods can I eat?  Fruits Berries. Pears. Apples. Oranges. Avocado. Prunes and raisins. Dried figs. Vegetables Sweet potatoes. Spinach. Kale. Artichokes. Cabbage. Broccoli. Cauliflower. Green peas. Carrots. Squash. Grains Whole-grain breads. Multigrain cereal. Oats and oatmeal. Brown rice. Barley. Bulgur wheat. [place]. Quinoa. Bran muffins. Popcorn. Rye wafer crackers. Meats and other proteins Navy, kidney, and pinto beans. Soybeans. Split peas. Lentils. Nuts and seeds. Dairy Fiber-fortified yogurt. Beverages Fiber-fortified soy milk. Fiber-fortified orange juice. Other foods Fiber bars. The  items listed above may not be a complete list of recommended foods and beverages. Contact a dietitian for more options. What foods are not recommended? Fruits Fruit juice. Cooked, strained fruit. Vegetables Fried potatoes. Canned vegetables. Well-cooked vegetables. Grains White bread. Pasta made with refined flour. White rice. Meats and other proteins Fatty cuts of meat. Fried chicken or fried fish. Dairy Milk. Yogurt. Cream cheese. Sour cream. Fats and  oils Butters. Beverages Soft drinks. Other foods Cakes and pastries. The items listed above may not be a complete list of foods and beverages to avoid. Contact a dietitian for more information. Summary  Fiber is a type of carbohydrate. It is found in fruits, vegetables, whole grains, and beans.  There are many health benefits of eating a high-fiber diet, such as preventing constipation, lowering blood cholesterol, helping with weight loss, and reducing your risk of heart disease, diabetes, and certain cancers.  Gradually increase your intake of fiber. Increasing too fast can result in cramping, bloating, and gas. Drink plenty of water while you increase your fiber.  The best sources of fiber include whole fruits and vegetables, whole grains, nuts, seeds, and beans. This information is not intended to replace advice given to you by your health care provider. Make sure you discuss any questions you have with your health care provider. Document Revised: 05/07/2017 Document Reviewed: 05/07/2017 Elsevier Patient Education  2020 [employer].

## 2020-04-16 ENCOUNTER — Other Ambulatory Visit: Payer: Self-pay | Admitting: Adult Health

## 2020-04-16 ENCOUNTER — Encounter: Payer: Self-pay | Admitting: Adult Health

## 2020-04-16 LAB — CBC WITH DIFFERENTIAL/PLATELET
Absolute Monocytes: 490 cells/uL (ref 200–950)
Basophils Absolute: 59 cells/uL (ref 0–200)
Basophils Relative: 1 %
Eosinophils Absolute: 153 cells/uL (ref 15–500)
Eosinophils Relative: 2.6 %
HCT: 40 % (ref 38.5–50.0)
Hemoglobin: 13.1 g/dL — ABNORMAL LOW (ref 13.2–17.1)
Lymphs Abs: 1274 cells/uL (ref 850–3900)
MCH: 27.5 pg (ref 27.0–33.0)
MCHC: 32.8 g/dL (ref 32.0–36.0)
MCV: 83.9 fL (ref 80.0–100.0)
MPV: 11.3 fL (ref 7.5–12.5)
Monocytes Relative: 8.3 %
Neutro Abs: 3924 cells/uL (ref 1500–7800)
Neutrophils Relative %: 66.5 %
Platelets: 260 10*3/uL (ref 140–400)
RBC: 4.77 10*6/uL (ref 4.20–5.80)
RDW: 14.6 % (ref 11.0–15.0)
Total Lymphocyte: 21.6 %
WBC: 5.9 10*3/uL (ref 3.8–10.8)

## 2020-04-16 LAB — COMPLETE METABOLIC PANEL WITH GFR
AG Ratio: 1.3 (calc) (ref 1.0–2.5)
ALT: 12 U/L (ref 9–46)
AST: 16 U/L (ref 10–35)
Albumin: 4.5 g/dL (ref 3.6–5.1)
Alkaline phosphatase (APISO): 76 U/L (ref 35–144)
BUN: 16 mg/dL (ref 7–25)
CO2: 28 mmol/L (ref 20–32)
Calcium: 10.8 mg/dL — ABNORMAL HIGH (ref 8.6–10.3)
Chloride: 102 mmol/L (ref 98–110)
Creat: 1.04 mg/dL (ref 0.70–1.18)
GFR, Est African American: 79 mL/min/{1.73_m2} (ref >=60)
GFR, Est Non African American: 68 mL/min/{1.73_m2} (ref >=60)
Globulin: 3.6 g/dL (calc) (ref 1.9–3.7)
Glucose, Bld: 122 mg/dL — ABNORMAL HIGH (ref 65–99)
Potassium: 4.4 mmol/L (ref 3.5–5.3)
Sodium: 139 mmol/L (ref 135–146)
Total Bilirubin: 0.6 mg/dL (ref 0.2–1.2)
Total Protein: 8.1 g/dL (ref 6.1–8.1)

## 2020-04-16 LAB — LIPID PANEL
Cholesterol: 155 mg/dL (ref <200)
HDL: 73 mg/dL (ref >=40)
LDL Cholesterol (Calc): 67 mg/dL (calc)
Non-HDL Cholesterol (Calc): 82 mg/dL (calc) (ref <130)
Total CHOL/HDL Ratio: 2.1 (calc) (ref <5.0)
Triglycerides: 69 mg/dL (ref <150)

## 2020-04-16 LAB — HEMOGLOBIN A1C
Hgb A1c MFr Bld: 6 % of total Hgb — ABNORMAL HIGH (ref <5.7)
Mean Plasma Glucose: 126 (calc)
eAG (mmol/L): 7 (calc)

## 2020-04-16 LAB — TSH: TSH: 0.88 mIU/L (ref 0.40–4.50)

## 2020-04-16 LAB — MAGNESIUM: Magnesium: 1.9 mg/dL (ref 1.5–2.5)

## 2020-04-16 NOTE — Progress Notes (Signed)
PATIENT IS AWARE OF LAB RESULTS AND INSTRUCTIONS. -E WELCH

## 2020-04-18 ENCOUNTER — Other Ambulatory Visit: Payer: Self-pay | Admitting: Internal Medicine

## 2020-04-25 ENCOUNTER — Other Ambulatory Visit: Payer: Self-pay | Admitting: Internal Medicine

## 2020-04-25 DIAGNOSIS — E1122 Type 2 diabetes mellitus with diabetic chronic kidney disease: Secondary | ICD-10-CM

## 2020-04-25 MED ORDER — METFORMIN HCL ER 500 MG PO TB24: ORAL_TABLET | ORAL | 0 refills | Status: DC

## 2020-04-26 ENCOUNTER — Ambulatory Visit: Payer: Medicare Other | Admitting: Podiatry

## 2020-05-11 ENCOUNTER — Other Ambulatory Visit: Payer: Self-pay

## 2020-05-11 ENCOUNTER — Encounter: Payer: Self-pay | Admitting: Podiatry

## 2020-05-11 ENCOUNTER — Ambulatory Visit (INDEPENDENT_AMBULATORY_CARE_PROVIDER_SITE_OTHER): Payer: PPO | Admitting: Podiatry

## 2020-05-11 DIAGNOSIS — M79674 Pain in right toe(s): Secondary | ICD-10-CM

## 2020-05-11 DIAGNOSIS — M79675 Pain in left toe(s): Secondary | ICD-10-CM

## 2020-05-11 DIAGNOSIS — M2041 Other hammer toe(s) (acquired), right foot: Secondary | ICD-10-CM

## 2020-05-11 DIAGNOSIS — M2042 Other hammer toe(s) (acquired), left foot: Secondary | ICD-10-CM

## 2020-05-11 DIAGNOSIS — E1151 Type 2 diabetes mellitus with diabetic peripheral angiopathy without gangrene: Secondary | ICD-10-CM

## 2020-05-11 DIAGNOSIS — B351 Tinea unguium: Secondary | ICD-10-CM

## 2020-05-11 NOTE — Progress Notes (Signed)
Subjective:  Patient ID: Marcus Chambers, male    DOB: 04-14-1941,  MRN: [id number]  79 y.o. male presents with at risk foot care. Pt has h/o NIDDM with PAD and painful corn(s) left 2nd and painful thick toenails that are difficult to trim. Pain interferes with ambulation. Aggravating factors include wearing enclosed shoe gear. Pain is relieved with periodic professional debridement.   PCP is Dr. [name] and last visit was 02/19/2020.  [name] voices no new pedal concerns on today's visit.  Review of Systems: Negative except as noted in the HPI. Denies N/V/F/Ch. Past Medical History:  Diagnosis Date  . Adenomatous colon polyp    2010  . Gout   . Hyperlipidemia   . Hypertension   . Prostate cancer ([place]) 2010   treated by external beam radiation  . Type II or unspecified type diabetes mellitus without mention of complication, not stated as uncontrolled   . Vitamin D deficiency    Past Surgical History:  Procedure Laterality Date  . APPENDECTOMY  1994   and GALLBLADDER  . CHOLECYSTECTOMY  1994  . colovesical fistula repair  01/2011  . KNEE SURGERY  1996   Patient Active Problem List   Diagnosis Date Noted  . Hypercalcemia 04/16/2020  . ACEI/ARB contraindicated 06/03/2018  . CKD stage 2 due to type 2 diabetes mellitus ([place]) 11/26/2017  . Situational anxiety  11/26/2017  . Hypertensive retinopathy 05/10/2017  . Obesity (BMI 30.0-34.9) 08/25/2014  . Medication management 10/14/2013  . H/O prostate cancer (2006) 10/14/2013  . Essential hypertension 09/05/2013  . Type 2 diabetes mellitus ([place])   . Hyperlipidemia associated with type 2 diabetes mellitus ([place])   . Gout   . Vitamin D deficiency   . History of colonic polyps 09/16/2009    Current Outpatient Medications:  .  allopurinol (ZYLOPRIM) 300 MG tablet, Take     1 tablet      Daily        to Prevent Gout, Disp: 90 tablet, Rfl: 0 .  amLODipine (NORVASC) 5 MG tablet, Take 1/2-1 tablet Daily for BP, Disp: 90  tablet, Rfl: 3 .  Ascorbic Acid (VITAMIN C) 1000 MG tablet, Take 1,000 mg by mouth daily., Disp: , Rfl:  .  aspirin (ASPIRIN LOW DOSE) 81 MG tablet, Take 81 mg by mouth daily. , Disp: , Rfl:  .  atorvastatin (LIPITOR) 20 MG tablet, Take 1 tablet     Daily     for Cholesterol, Disp: 90 tablet, Rfl: 1 .  Azelastine HCl 0.15 % SOLN, 2 sprays each Nostril 2 x /day, Disp: 90 mL, Rfl: 3 .  blood glucose meter kit and supplies KIT, CHECK BLOOD SUGAR 1 TIME DAILY-E11.9, Disp: 1 each, Rfl: 0 .  bumetanide (BUMEX) 2 MG tablet, Take 1 tablet by mouth twice daily, Disp: 180 tablet, Rfl: 1 .  Cholecalciferol (VITAMIN D PO), Take 10,000 Units by mouth. , Disp: , Rfl:  .  glucose blood (ONETOUCH ULTRA) test strip, Test blood sugar once daily, Disp: 100 each, Rfl: 12 .  hyoscyamine (LEVSIN SL) 0.125 MG SL tablet, DISSOLVE 1 TABLET IN MOUTH EVERY 4 HOURS AS NEEDED, Disp: 60 tablet, Rfl: 1 .  Lancets MISC, CHECK BLOOD SUGAR 1 TIME DAILY-E11.9, Disp: 100 each, Rfl: 1 .  MAGNESIUM PO, Take 500 mg by mouth 2 (two) times daily. , Disp: , Rfl:  .  metFORMIN (GLUCOPHAGE-XR) 500 MG 24 hr tablet, Take 2 tablets 2 x/ day with meals  for Diabetes, Disp: 360  tablet, Rfl: 0 .  mometasone (NASONEX) 50 MCG/ACT nasal spray, Place 1 spray into the nose 2 (two) times daily as needed. 1 spray into each nostril., Disp: 17 g, Rfl: 2 .  montelukast (SINGULAIR) 10 MG tablet, Take    1 tablet       Daily    for Allergies, Disp: 90 tablet, Rfl: 0 .  zinc gluconate 50 MG tablet, Take 50 mg by mouth daily., Disp: , Rfl:  Allergies  Allergen Reactions  . Ace Inhibitors    Social History   Tobacco Use  Smoking Status Former Smoker  . Quit date: 07/17/1985  . Years since quitting: 34.8  Smokeless Tobacco Never Used   Objective:  There were no vitals filed for this visit. Constitutional Patient is a pleasant 79 y.o. African American male, morbidly obese in NAD.  Vascular Capillary refill time to digits immediate b/l. Palpable DP  pulse(s) b/l lower extremities Nonpalpable PT pulse(s) b/l lower extremities. Pedal hair present. Lower extremity skin temperature gradient within normal limits. No pain with calf compression b/l. No cyanosis or clubbing noted.  Neurologic Normal speech. Oriented to person, place, and time. Protective sensation intact 5/5 intact bilaterally with 10g monofilament b/l. Vibratory sensation intact b/l. Proprioception intact bilaterally. Clonus negative b/l.  Dermatologic Pedal skin with normal turgor, texture and tone bilaterally. No open wounds bilaterally. No interdigital macerations bilaterally. Toenails 1-5 b/l elongated, discolored, dystrophic, thickened, crumbly with subungual debris and tenderness to dorsal palpation.  Orthopedic: Normal muscle strength 5/5 to all lower extremity muscle groups bilaterally. No pain crepitus or joint limitation noted with ROM b/l. Hallux valgus with bunion deformity noted right lower extremity. Hammertoes noted to the 2-5 bilaterally. Pes planus deformity noted b/l.    Radiographs: None Assessment:   1. Pain due to onychomycosis of toenails of both feet   2. Acquired hammertoes of both feet   3. Diabetes mellitus with peripheral vascular disease ([place])    Plan:  Patient was evaluated and treated and all questions answered.  Onychomycosis with pain -Nails palliatively debridement as below. -Educated on self-care  Procedure: Nail Debridement Rationale: Pain Type of Debridement: manual, sharp debridement. Instrumentation: Nail nipper, rotary burr. Number of Nails: 10  -Examined patient. -Continue diabetic foot care principles. -Toenails 1-5 b/l were debrided in length and girth with sterile nail nippers and dremel without iatrogenic bleeding.  -Patient to continue soft, supportive shoe gear daily. -Patient/POA to call should there be question/concern in the interim.  Return in about 3 months (around 08/11/2020).  Marzetta Board, DPM

## 2020-05-17 ENCOUNTER — Other Ambulatory Visit: Payer: Self-pay

## 2020-05-17 ENCOUNTER — Ambulatory Visit (INDEPENDENT_AMBULATORY_CARE_PROVIDER_SITE_OTHER): Payer: PPO

## 2020-05-17 NOTE — Progress Notes (Signed)
Patient presents to the office for a nurse visit to have labs done to check Parathyroid Hormone. Patient states that he hasn't changed any of his medications. Vitals taken and recorded.

## 2020-05-18 LAB — PTH, INTACT AND CALCIUM
Calcium: 10.7 mg/dL — ABNORMAL HIGH (ref 8.6–10.3)
PTH: 50 pg/mL (ref 14–64)

## 2020-05-19 ENCOUNTER — Other Ambulatory Visit: Payer: Self-pay | Admitting: Adult Health

## 2020-05-24 ENCOUNTER — Ambulatory Visit (INDEPENDENT_AMBULATORY_CARE_PROVIDER_SITE_OTHER): Payer: PPO

## 2020-05-24 ENCOUNTER — Other Ambulatory Visit: Payer: Self-pay

## 2020-05-24 NOTE — Progress Notes (Signed)
Patient states he is following all directions given by PCP. Takes meds as he should be doing.

## 2020-05-25 LAB — VITAMIN D 25 HYDROXY (VIT D DEFICIENCY, FRACTURES): Vit D, 25-Hydroxy: 90 ng/mL (ref 30–100)

## 2020-06-13 ENCOUNTER — Other Ambulatory Visit: Payer: Self-pay | Admitting: Internal Medicine

## 2020-06-13 DIAGNOSIS — I10 Essential (primary) hypertension: Secondary | ICD-10-CM

## 2020-06-16 ENCOUNTER — Ambulatory Visit: Payer: PPO

## 2020-06-21 ENCOUNTER — Other Ambulatory Visit: Payer: Self-pay

## 2020-06-21 ENCOUNTER — Ambulatory Visit (INDEPENDENT_AMBULATORY_CARE_PROVIDER_SITE_OTHER): Payer: PPO

## 2020-06-21 NOTE — Progress Notes (Signed)
Patient presents to the office for a nurse visit to have labs done and pick up container to complete a 24 hour urine. No questions or concerns. Vitals checked and recorded.

## 2020-06-22 LAB — PTH, INTACT AND CALCIUM
Calcium: 10.2 mg/dL (ref 8.6–10.3)
PTH: 71 pg/mL — ABNORMAL HIGH (ref 14–64)

## 2020-06-24 LAB — CALCIUM, URINE, 24 HOUR: Calcium, 24H Urine: 166 mg/(24.h)

## 2020-07-05 ENCOUNTER — Encounter: Payer: Self-pay | Admitting: Adult Health Nurse Practitioner

## 2020-07-05 ENCOUNTER — Other Ambulatory Visit: Payer: Self-pay | Admitting: Adult Health Nurse Practitioner

## 2020-07-05 DIAGNOSIS — E213 Hyperparathyroidism, unspecified: Secondary | ICD-10-CM

## 2020-07-18 ENCOUNTER — Other Ambulatory Visit: Payer: Self-pay | Admitting: Internal Medicine

## 2020-07-21 ENCOUNTER — Ambulatory Visit: Payer: PPO | Admitting: Internal Medicine

## 2020-07-25 ENCOUNTER — Other Ambulatory Visit: Payer: Self-pay | Admitting: Internal Medicine

## 2020-07-27 ENCOUNTER — Other Ambulatory Visit: Payer: Self-pay

## 2020-07-27 ENCOUNTER — Ambulatory Visit (HOSPITAL_COMMUNITY)
Admission: RE | Admit: 2020-07-27 | Discharge: 2020-07-27 | Disposition: A | Discharge status: Home / Self Care | Payer: PPO | Source: Ambulatory Visit | Attending: Adult Health Nurse Practitioner | Admitting: Adult Health Nurse Practitioner

## 2020-07-27 DIAGNOSIS — E213 Hyperparathyroidism, unspecified: Secondary | ICD-10-CM | POA: Diagnosis present

## 2020-07-27 MED ORDER — TECHNETIUM TC 99M SESTAMIBI GENERIC - CARDIOLITE
25.0000 | Freq: Once | INTRAVENOUS | Status: AC | PRN
  Administered 2020-07-27: 26 via INTRAVENOUS

## 2020-07-29 ENCOUNTER — Ambulatory Visit (INDEPENDENT_AMBULATORY_CARE_PROVIDER_SITE_OTHER): Payer: PPO | Admitting: Internal Medicine

## 2020-07-29 ENCOUNTER — Other Ambulatory Visit: Payer: Self-pay

## 2020-07-29 VITALS — BP 140/80 | HR 71 | Temp 97.3°F | Resp 16 | Ht 74.0 in | Wt 259.2 lb

## 2020-07-29 DIAGNOSIS — I10 Essential (primary) hypertension: Secondary | ICD-10-CM | POA: Diagnosis not present

## 2020-07-29 DIAGNOSIS — E559 Vitamin D deficiency, unspecified: Secondary | ICD-10-CM

## 2020-07-29 DIAGNOSIS — N182 Chronic kidney disease, stage 2 (mild): Secondary | ICD-10-CM | POA: Diagnosis not present

## 2020-07-29 DIAGNOSIS — E785 Hyperlipidemia, unspecified: Secondary | ICD-10-CM | POA: Diagnosis not present

## 2020-07-29 DIAGNOSIS — E1122 Type 2 diabetes mellitus with diabetic chronic kidney disease: Secondary | ICD-10-CM | POA: Diagnosis not present

## 2020-07-29 DIAGNOSIS — Z79899 Other long term (current) drug therapy: Secondary | ICD-10-CM

## 2020-07-29 DIAGNOSIS — M1000 Idiopathic gout, unspecified site: Secondary | ICD-10-CM

## 2020-07-29 DIAGNOSIS — E1169 Type 2 diabetes mellitus with other specified complication: Secondary | ICD-10-CM | POA: Diagnosis not present

## 2020-07-29 NOTE — Progress Notes (Signed)
History of Present Illness:       This very nice 80 y.o.  DBM presents for 6 month follow up with HTN, HLD, T2_NIDDM and Vitamin D Deficiency.       Patient is treated for HTN  (1989) & BP has been controlled at home. Today's BP is high normal at goal - 140/80. Patient has had no complaints of any cardiac type chest pain, palpitations, dyspnea / orthopnea / PND, dizziness, claudication, or dependent edema.      Hyperlipidemia is controlled with diet & meds. Patient denies myalgias or other med SE's. Last Lipids were at goal:  Lab Results  Component Value Date   CHOL 155 04/15/2020   HDL 73 04/15/2020   LDLCALC 67 04/15/2020   TRIG 69 04/15/2020   CHOLHDL 2.1 04/15/2020    Also, the patient has history of moderate obesity (BMI 34.4+) and consequent  T2_NIDDM (2005) with CKD2 and has had no symptoms of reactive hypoglycemia, diabetic polys, paresthesias or visual blurring.  Last A1c was not at goal:  Lab Results  Component Value Date   HGBA1C 6.0 (H) 04/15/2020       Further, the patient also has history of Vitamin D Deficiency and supplements vitamin D without any suspected side-effects. Last vitamin D was at goal:  Lab Results  Component Value Date   VD25OH 90 05/24/2020    Current Outpatient Medications on File Prior to Visit  Medication Sig  . allopurinol (ZYLOPRIM) 300 MG tablet Take      1 tablet       Daily        to Prevent Gout  . amLODipine (NORVASC) 5 MG tablet Take 1 tablet by mouth once daily for blood pressure  . Ascorbic Acid (VITAMIN C) 1000 MG tablet Take 1,000 mg by mouth daily.  Marland Kitchen aspirin 81 MG tablet Take 81 mg by mouth daily.  Marland Kitchen atorvastatin (LIPITOR) 20 MG tablet Take 1 tablet     Daily     for Cholesterol  . Azelastine HCl 0.15 % SOLN 2 sprays each Nostril 2 x /day  . blood glucose meter kit and supplies KIT CHECK BLOOD SUGAR 1 TIME DAILY-E11.9  . bumetanide (BUMEX) 2 MG tablet Take     1 tablet      2x  /day for BP and  Fluid Retention /Ankle  Swelling  . Cholecalciferol (VITAMIN D PO) Take 10,000 Units by mouth.   Marland Kitchen glucose blood (ONETOUCH ULTRA) test strip Test blood sugar once daily  . hyoscyamine (LEVSIN SL) 0.125 MG SL tablet DISSOLVE 1 TABLET IN MOUTH EVERY 4 HOURS AS NEEDED  . Lancets MISC CHECK BLOOD SUGAR 1 TIME DAILY-E11.9  . MAGNESIUM PO Take 500 mg by mouth 2 (two) times daily.   . metFORMIN (GLUCOPHAGE-XR) 500 MG 24 hr tablet Take 2 tablets 2 x/ day with meals  for Diabetes  . zinc gluconate 50 MG tablet Take 50 mg by mouth daily.  . mometasone (NASONEX) 50 MCG/ACT nasal spray Place 1 spray into the nose 2 (two) times daily as needed. 1 spray into each nostril.    Allergies  Allergen Reactions  . Ace Inhibitors     PMHx:   Past Medical History:  Diagnosis Date  . Adenomatous colon polyp    2010  . Gout   . Hyperlipidemia   . Hypertension   . Prostate cancer ([place]) 2010   treated by external beam radiation  . Type II or unspecified  type diabetes mellitus without mention of complication, not stated as uncontrolled   . Vitamin D deficiency     Immunization History  Administered Date(s) Administered  . DT (Pediatric) 03/21/2012  . Influenza, High Dose Seasonal PF 03/27/2019  . Influenza-Unspecified 04/16/2014, 04/16/2015, 04/16/2018  . Moderna Sars-Covid-2 Vaccination 08/07/2019, 09/03/2019  . Pneumococcal Conjugate-13 05/19/2014  . Pneumococcal Polysaccharide-23 07/24/2007    Past Surgical History:  Procedure Laterality Date  . APPENDECTOMY  1994   and GALLBLADDER  . CHOLECYSTECTOMY  1994  . colovesical fistula repair  01/2011  . KNEE SURGERY  1996    FHx:    Reviewed / unchanged  SHx:    Reviewed / unchanged   Systems Review:  Constitutional: Denies fever, chills, wt changes, headaches, insomnia, fatigue, night sweats, change in appetite. Eyes: Denies redness, blurred vision, diplopia, discharge, itchy, watery eyes.  ENT: Denies discharge, congestion, post nasal drip, epistaxis, sore  throat, earache, hearing loss, dental pain, tinnitus, vertigo, sinus pain, snoring.  CV: Denies chest pain, palpitations, irregular heartbeat, syncope, dyspnea, diaphoresis, orthopnea, PND, claudication or edema. Respiratory: denies cough, dyspnea, DOE, pleurisy, hoarseness, laryngitis, wheezing.  Gastrointestinal: Denies dysphagia, odynophagia, heartburn, reflux, water brash, abdominal pain or cramps, nausea, vomiting, bloating, diarrhea, constipation, hematemesis, melena, hematochezia  or hemorrhoids. Genitourinary: Denies dysuria, frequency, urgency, nocturia, hesitancy, discharge, hematuria or flank pain. Musculoskeletal: Denies arthralgias, myalgias, stiffness, jt. swelling, pain, limping or strain/sprain.  Skin: Denies pruritus, rash, hives, warts, acne, eczema or change in skin lesion(s). Neuro: No weakness, tremor, incoordination, spasms, paresthesia or pain. Psychiatric: Denies confusion, memory loss or sensory loss. Endo: Denies change in weight, skin or hair change.  Heme/Lymph: No excessive bleeding, bruising or enlarged lymph nodes.  Physical Exam  BP 140/80   Pulse 71   Temp (!) 97.3 F (36.3 C)   Resp 16   Ht '6\' 2"'  (1.88 m)   Wt 259 lb 3.2 oz (117.6 kg)   SpO2 97%   BMI 33.28 kg/m   Appears over nourished, well groomed  and in no distress.  Eyes: PERRLA, EOMs, conjunctiva no swelling or erythema. Sinuses: No frontal/maxillary tenderness ENT/Mouth: EAC's clear, TM's nl w/o erythema, bulging. Nares clear w/o erythema, swelling, exudates. Oropharynx clear without erythema or exudates. Oral hygiene is good. Tongue normal, non obstructing. Hearing intact.  Neck: Supple. Thyroid not palpable. Car 2+/2+ without bruits, nodes or JVD. Chest: Respirations nl with BS clear & equal w/o rales, rhonchi, wheezing or stridor.  Cor: Heart sounds normal w/ regular rate and rhythm without sig. murmurs, gallops, clicks or rubs. Peripheral pulses normal and equal  without edema.  Abdomen:  Soft & bowel sounds normal. Non-tender w/o guarding, rebound, hernias, masses or organomegaly.  Lymphatics: Unremarkable.  Musculoskeletal: Full ROM all peripheral extremities, joint stability, 5/5 strength and normal gait.  Skin: Warm, dry without exposed rashes, lesions or ecchymosis apparent.  Neuro: Cranial nerves intact, reflexes equal bilaterally. Sensory-motor testing grossly intact. Tendon reflexes grossly intact.  Pysch: Alert & oriented x 3.  Insight and judgement nl & appropriate. No ideations.  Assessment and Plan:  1. Essential hypertension  - Continue medication, monitor blood pressure at home.  - Continue DASH diet.  Reminder to go to the ER if any CP,  SOB, nausea, dizziness, severe HA, changes vision/speech.  - CBC with Differential/Platelet - COMPLETE METABOLIC PANEL WITH GFR - Magnesium  2. Hyperlipidemia associated with type 2 diabetes mellitus ([place])  - Continue diet/meds, exercise,& lifestyle modifications.  - Continue monitor periodic cholesterol/liver & renal functions   -  Lipid panel - TSH  3. Type 2 diabetes mellitus with stage 2 chronic kidney  disease, without long-term current use of insulin ([facility]CC)  - Continue diet, exercise  - Lifestyle modifications.  - Monitor appropriate labs.  - Hemoglobin A1c - Insulin, random  4. Vitamin D deficiency  - Continue supplementation.  - VITAMIN D 25 Hydroxy   5. Idiopathic gout  - Uric acid  6. Medication management  - CBC with Differential/Platelet - COMPLETE METABOLIC PANEL WITH GFR - Magnesium - Lipid panel - TSH - Hemoglobin A1c - Insulin, random - VITAMIN D 25 Hydroxy - Uric acid       Discussed  regular exercise, BP monitoring, weight control to achieve/maintain BMI less than 25 and discussed med and SE's. Recommended labs to assess and monitor clinical status with further disposition pending results of labs.  I discussed the assessment and treatment plan with the patient. The patient was  provided an opportunity to ask questions and all were answered. The patient agreed with the plan and demonstrated an understanding of the instructions.  I provided over 30 minutes of exam, counseling, chart review and  complex critical decision making.       The patient was advised to call back or seek an in-person evaluation if the symptoms worsen or if the condition fails to improve as anticipated.   Kirtland Bouchard, MD

## 2020-07-29 NOTE — Patient Instructions (Signed)

## 2020-07-30 LAB — COMPLETE METABOLIC PANEL WITH GFR
AG Ratio: 1.2 (calc) (ref 1.0–2.5)
ALT: 17 U/L (ref 9–46)
AST: 19 U/L (ref 10–35)
Albumin: 4.4 g/dL (ref 3.6–5.1)
Alkaline phosphatase (APISO): 85 U/L (ref 35–144)
BUN: 24 mg/dL (ref 7–25)
CO2: 28 mmol/L (ref 20–32)
Calcium: 10.7 mg/dL — ABNORMAL HIGH (ref 8.6–10.3)
Chloride: 102 mmol/L (ref 98–110)
Creat: 1.04 mg/dL (ref 0.70–1.18)
GFR, Est African American: 79 mL/min/{1.73_m2} (ref >=60)
GFR, Est Non African American: 68 mL/min/{1.73_m2} (ref >=60)
Globulin: 3.6 g/dL (calc) (ref 1.9–3.7)
Glucose, Bld: 99 mg/dL (ref 65–99)
Potassium: 4.1 mmol/L (ref 3.5–5.3)
Sodium: 141 mmol/L (ref 135–146)
Total Bilirubin: 0.5 mg/dL (ref 0.2–1.2)
Total Protein: 8 g/dL (ref 6.1–8.1)

## 2020-07-30 LAB — HEMOGLOBIN A1C
Hgb A1c MFr Bld: 6.1 % of total Hgb — ABNORMAL HIGH (ref <5.7)
Mean Plasma Glucose: 128 mg/dL
eAG (mmol/L): 7.1 mmol/L

## 2020-07-30 LAB — LIPID PANEL
Cholesterol: 157 mg/dL (ref <200)
HDL: 73 mg/dL (ref >=40)
LDL Cholesterol (Calc): 69 mg/dL (calc)
Non-HDL Cholesterol (Calc): 84 mg/dL (calc) (ref <130)
Total CHOL/HDL Ratio: 2.2 (calc) (ref <5.0)
Triglycerides: 71 mg/dL (ref <150)

## 2020-07-30 LAB — CBC WITH DIFFERENTIAL/PLATELET
Absolute Monocytes: 548 cells/uL (ref 200–950)
Basophils Absolute: 50 cells/uL (ref 0–200)
Basophils Relative: 0.6 %
Eosinophils Absolute: 100 cells/uL (ref 15–500)
Eosinophils Relative: 1.2 %
HCT: 38.6 % (ref 38.5–50.0)
Hemoglobin: 12.6 g/dL — ABNORMAL LOW (ref 13.2–17.1)
Lymphs Abs: 1428 cells/uL (ref 850–3900)
MCH: 26.8 pg — ABNORMAL LOW (ref 27.0–33.0)
MCHC: 32.6 g/dL (ref 32.0–36.0)
MCV: 82 fL (ref 80.0–100.0)
MPV: 11.7 fL (ref 7.5–12.5)
Monocytes Relative: 6.6 %
Neutro Abs: 6175 cells/uL (ref 1500–7800)
Neutrophils Relative %: 74.4 %
Platelets: 278 10*3/uL (ref 140–400)
RBC: 4.71 10*6/uL (ref 4.20–5.80)
RDW: 14.8 % (ref 11.0–15.0)
Total Lymphocyte: 17.2 %
WBC: 8.3 10*3/uL (ref 3.8–10.8)

## 2020-07-30 LAB — MAGNESIUM: Magnesium: 1.7 mg/dL (ref 1.5–2.5)

## 2020-07-30 LAB — TSH: TSH: 1.12 mIU/L (ref 0.40–4.50)

## 2020-07-30 LAB — URIC ACID: Uric Acid, Serum: 3.7 mg/dL — ABNORMAL LOW (ref 4.0–8.0)

## 2020-07-30 LAB — VITAMIN D 25 HYDROXY (VIT D DEFICIENCY, FRACTURES): Vit D, 25-Hydroxy: 93 ng/mL (ref 30–100)

## 2020-07-30 LAB — INSULIN, RANDOM: Insulin: 7.3 u[IU]/mL

## 2020-08-02 ENCOUNTER — Encounter: Payer: Self-pay | Admitting: Internal Medicine

## 2020-08-05 NOTE — Progress Notes (Signed)
Patient is aware of lab results and instructions. -e welch

## 2020-08-08 ENCOUNTER — Other Ambulatory Visit: Payer: Self-pay | Admitting: Internal Medicine

## 2020-08-08 MED ORDER — METFORMIN HCL ER 500 MG PO TB24
ORAL_TABLET | ORAL | 0 refills | Status: DC
Start: 2020-08-08 — End: 2020-11-21

## 2020-08-16 ENCOUNTER — Other Ambulatory Visit: Payer: Self-pay | Admitting: Adult Health Nurse Practitioner

## 2020-08-16 DIAGNOSIS — D351 Benign neoplasm of parathyroid gland: Secondary | ICD-10-CM

## 2020-08-17 ENCOUNTER — Ambulatory Visit (INDEPENDENT_AMBULATORY_CARE_PROVIDER_SITE_OTHER): Payer: PPO | Admitting: Podiatry

## 2020-08-17 ENCOUNTER — Other Ambulatory Visit: Payer: Self-pay

## 2020-08-17 ENCOUNTER — Encounter: Payer: Self-pay | Admitting: Podiatry

## 2020-08-17 DIAGNOSIS — E1151 Type 2 diabetes mellitus with diabetic peripheral angiopathy without gangrene: Secondary | ICD-10-CM | POA: Diagnosis not present

## 2020-08-17 DIAGNOSIS — B351 Tinea unguium: Secondary | ICD-10-CM | POA: Diagnosis not present

## 2020-08-17 DIAGNOSIS — L84 Corns and callosities: Secondary | ICD-10-CM | POA: Diagnosis not present

## 2020-08-17 DIAGNOSIS — M79674 Pain in right toe(s): Secondary | ICD-10-CM | POA: Diagnosis not present

## 2020-08-17 DIAGNOSIS — M79675 Pain in left toe(s): Secondary | ICD-10-CM

## 2020-08-17 DIAGNOSIS — M2042 Other hammer toe(s) (acquired), left foot: Secondary | ICD-10-CM

## 2020-08-17 DIAGNOSIS — M2041 Other hammer toe(s) (acquired), right foot: Secondary | ICD-10-CM

## 2020-08-17 NOTE — Progress Notes (Addendum)
Subjective:  Patient ID: Marcus Chambers, male    DOB: 1940/09/28,  MRN: [id number]  80 y.o. male presents with at risk foot care. Pt has h/o NIDDM with PAD and painful corn(s) left 2nd and painful thick toenails that are difficult to trim. Pain interferes with ambulation. Aggravating factors include wearing enclosed shoe gear. Pain is relieved with periodic professional debridement.   PCP is Dr. [name] and last visit was 07/29/2020.  Mr. Gullo voices no new pedal concerns on today's visit.  Review of Systems: Negative except as noted in the HPI. Denies N/V/F/Ch. Past Medical History:  Diagnosis Date  . Adenomatous colon polyp    2010  . Gout   . Hyperlipidemia   . Hypertension   . Prostate cancer ([place]) 2010   treated by external beam radiation  . Type II or unspecified type diabetes mellitus without mention of complication, not stated as uncontrolled   . Vitamin D deficiency    Past Surgical History:  Procedure Laterality Date  . APPENDECTOMY  1994   and GALLBLADDER  . CHOLECYSTECTOMY  1994  . colovesical fistula repair  01/2011  . KNEE SURGERY  1996   Patient Active Problem List   Diagnosis Date Noted  . Hypercalcemia 04/16/2020  . ACEI/ARB contraindicated 06/03/2018  . CKD stage 2 due to type 2 diabetes mellitus ([place]) 11/26/2017  . Situational anxiety  11/26/2017  . Hypertensive retinopathy 05/10/2017  . Obesity (BMI 30.0-34.9) 08/25/2014  . Medication management 10/14/2013  . H/O prostate cancer (2006) 10/14/2013  . Essential hypertension 09/05/2013  . Type 2 diabetes mellitus ([place])   . Hyperlipidemia associated with type 2 diabetes mellitus ([place])   . Gout   . Vitamin D deficiency   . History of colonic polyps 09/16/2009    Current Outpatient Medications:  .  allopurinol (ZYLOPRIM) 300 MG tablet, Take      1 tablet       Daily        to Prevent Gout, Disp: 90 tablet, Rfl: 0 .  amLODipine (NORVASC) 5 MG tablet, Take 1 tablet by mouth once daily for blood  pressure, Disp: 90 tablet, Rfl: 0 .  Ascorbic Acid (VITAMIN C) 1000 MG tablet, Take 1,000 mg by mouth daily., Disp: , Rfl:  .  aspirin 81 MG tablet, Take 81 mg by mouth daily., Disp: , Rfl:  .  atorvastatin (LIPITOR) 20 MG tablet, Take 1 tablet     Daily     for Cholesterol, Disp: 90 tablet, Rfl: 1 .  Azelastine HCl 0.15 % SOLN, 2 sprays each Nostril 2 x /day, Disp: 90 mL, Rfl: 3 .  blood glucose meter kit and supplies KIT, Chambers BLOOD SUGAR 1 TIME DAILY-E11.9, Disp: 1 each, Rfl: 0 .  bumetanide (BUMEX) 2 MG tablet, Take     1 tablet      2x  /day for BP and  Fluid Retention /Ankle Swelling, Disp: 180 tablet, Rfl: 0 .  Cholecalciferol (VITAMIN D PO), Take 10,000 Units by mouth. , Disp: , Rfl:  .  glucose blood (ONETOUCH ULTRA) test strip, Test blood sugar once daily, Disp: 100 each, Rfl: 12 .  hyoscyamine (LEVSIN SL) 0.125 MG SL tablet, DISSOLVE 1 TABLET IN MOUTH EVERY 4 HOURS AS NEEDED, Disp: 60 tablet, Rfl: 1 .  Lancets MISC, Chambers BLOOD SUGAR 1 TIME DAILY-E11.9, Disp: 100 each, Rfl: 1 .  MAGNESIUM PO, Take 500 mg by mouth 2 (two) times daily. , Disp: , Rfl:  .  metFORMIN (GLUCOPHAGE-XR)  500 MG 24 hr tablet, Take   2 tablets   2 x /day    with Meals    for Diabetes, Disp: 360 tablet, Rfl: 0 .  mometasone (NASONEX) 50 MCG/ACT nasal spray, Place 1 spray into the nose 2 (two) times daily as needed. 1 spray into each nostril., Disp: 17 g, Rfl: 2 .  zinc gluconate 50 MG tablet, Take 50 mg by mouth daily., Disp: , Rfl:  Allergies  Allergen Reactions  . Ace Inhibitors    Social History   Tobacco Use  Smoking Status Former Smoker  . Quit date: 07/17/1985  . Years since quitting: 35.1  Smokeless Tobacco Never Used   Objective:  There were no vitals filed for this visit. Constitutional Patient is a pleasant 80 y.o. African American male, morbidly obese in NAD.  Vascular Capillary refill time to digits immediate b/l. Palpable DP pulse(s) b/l lower extremities Nonpalpable PT pulse(s) b/l lower  extremities. Pedal hair absent. Lower extremity skin temperature gradient within normal limits. No pain with calf compression b/l. No cyanosis or clubbing noted.  Neurologic Normal speech. Oriented to person, place, and time. Protective sensation intact 5/5 intact bilaterally with 10g monofilament b/l. Vibratory sensation intact b/l. Proprioception intact bilaterally. Clonus negative b/l.  Dermatologic Pedal skin with normal turgor, texture and tone bilaterally. No open wounds bilaterally. No interdigital macerations bilaterally. Toenails 1-5 b/l elongated, discolored, dystrophic, thickened, crumbly with subungual debris and tenderness to dorsal palpation. Hyperkeratotic lesion(s) L 2nd toe dorsal DIPJ and R 2nd toe medial DIPJ.  No erythema, no edema, no drainage, no fluctuance.  Orthopedic: Normal muscle strength 5/5 to all lower extremity muscle groups bilaterally. No pain crepitus or joint limitation noted with ROM b/l. Hallux valgus with bunion deformity noted right lower extremity. Hammertoes noted to the 2-5 bilaterally. Pes planus deformity noted b/l.    Radiographs: None Assessment:   1. Pain due to onychomycosis of toenails of both feet   2. Corns   3. Acquired hammertoes of both feet   4. Diabetes mellitus with peripheral vascular disease ([place])    Plan:  Patient was evaluated and treated and all questions answered.  Onychomycosis with pain -Nails palliatively debridement as below. -Educated on self-care  Procedure: Nail Debridement Rationale: Pain Type of Debridement: manual, sharp debridement. Instrumentation: Nail nipper, rotary burr. Number of Nails: 10  -Examined patient. -No new findings. No new orders. -Continue diabetic foot care principles. -Patient to continue soft, supportive shoe gear daily. -Toenails 1-5 b/l were debrided in length and girth with sterile nail nippers and dremel without iatrogenic bleeding.  -Corn(s) L 2nd toe and R 2nd toe pared utilizing  sterile scalpel blade without complication or incident. Total number debrided=2. -Patient to report any pedal injuries to medical professional immediately. -Dispensed gel toe caps for b/l 2nd toes. Apply every morning. Remove every evening. -Patient/POA to call should there be question/concern in the interim.  Return in about 3 months (around 11/14/2020).  Marzetta Board, DPM

## 2020-08-27 ENCOUNTER — Telehealth: Payer: Self-pay | Admitting: Podiatry

## 2020-08-27 NOTE — Telephone Encounter (Signed)
pts my chart is not active so I was not able to send message.

## 2020-08-27 NOTE — Telephone Encounter (Signed)
Thank you :)

## 2020-08-27 NOTE — Telephone Encounter (Signed)
Pt did not schedule an appt when he left to get diabetic shoes.  I called and voicemail is full so I could not leave message. I will send one thru my chart.

## 2020-09-12 ENCOUNTER — Other Ambulatory Visit: Payer: Self-pay | Admitting: Adult Health Nurse Practitioner

## 2020-09-12 DIAGNOSIS — I10 Essential (primary) hypertension: Secondary | ICD-10-CM

## 2020-10-16 ENCOUNTER — Other Ambulatory Visit: Payer: Self-pay | Admitting: Adult Health

## 2020-10-16 ENCOUNTER — Other Ambulatory Visit: Payer: Self-pay | Admitting: Internal Medicine

## 2020-10-16 DIAGNOSIS — E785 Hyperlipidemia, unspecified: Secondary | ICD-10-CM

## 2020-10-16 DIAGNOSIS — E1169 Type 2 diabetes mellitus with other specified complication: Secondary | ICD-10-CM

## 2020-10-23 ENCOUNTER — Other Ambulatory Visit: Payer: Self-pay | Admitting: Internal Medicine

## 2020-11-02 ENCOUNTER — Ambulatory Visit: Payer: PPO | Admitting: Adult Health Nurse Practitioner

## 2020-11-21 ENCOUNTER — Other Ambulatory Visit: Payer: Self-pay | Admitting: Internal Medicine

## 2020-11-26 ENCOUNTER — Encounter: Payer: Self-pay | Admitting: Adult Health

## 2020-11-26 ENCOUNTER — Other Ambulatory Visit: Payer: Self-pay

## 2020-11-26 ENCOUNTER — Ambulatory Visit (INDEPENDENT_AMBULATORY_CARE_PROVIDER_SITE_OTHER): Payer: PPO | Admitting: Adult Health

## 2020-11-26 VITALS — BP 132/72 | HR 46 | Temp 97.3°F | Wt 267.0 lb

## 2020-11-26 DIAGNOSIS — E1169 Type 2 diabetes mellitus with other specified complication: Secondary | ICD-10-CM | POA: Diagnosis not present

## 2020-11-26 DIAGNOSIS — I10 Essential (primary) hypertension: Secondary | ICD-10-CM | POA: Diagnosis not present

## 2020-11-26 DIAGNOSIS — Z860100 Personal history of colon polyps, unspecified: Secondary | ICD-10-CM

## 2020-11-26 DIAGNOSIS — E669 Obesity, unspecified: Secondary | ICD-10-CM

## 2020-11-26 DIAGNOSIS — Z0000 Encounter for general adult medical examination without abnormal findings: Secondary | ICD-10-CM

## 2020-11-26 DIAGNOSIS — E66811 Obesity, class 1: Secondary | ICD-10-CM

## 2020-11-26 DIAGNOSIS — H35033 Hypertensive retinopathy, bilateral: Secondary | ICD-10-CM

## 2020-11-26 DIAGNOSIS — I493 Ventricular premature depolarization: Secondary | ICD-10-CM

## 2020-11-26 DIAGNOSIS — M1000 Idiopathic gout, unspecified site: Secondary | ICD-10-CM

## 2020-11-26 DIAGNOSIS — Z79899 Other long term (current) drug therapy: Secondary | ICD-10-CM

## 2020-11-26 DIAGNOSIS — Z8546 Personal history of malignant neoplasm of prostate: Secondary | ICD-10-CM

## 2020-11-26 DIAGNOSIS — F418 Other specified anxiety disorders: Secondary | ICD-10-CM

## 2020-11-26 DIAGNOSIS — Z5320 Procedure and treatment not carried out because of patient's decision for unspecified reasons: Secondary | ICD-10-CM | POA: Diagnosis not present

## 2020-11-26 DIAGNOSIS — E1122 Type 2 diabetes mellitus with diabetic chronic kidney disease: Secondary | ICD-10-CM | POA: Diagnosis not present

## 2020-11-26 DIAGNOSIS — N182 Chronic kidney disease, stage 2 (mild): Secondary | ICD-10-CM

## 2020-11-26 DIAGNOSIS — R6889 Other general symptoms and signs: Secondary | ICD-10-CM | POA: Diagnosis not present

## 2020-11-26 DIAGNOSIS — E785 Hyperlipidemia, unspecified: Secondary | ICD-10-CM | POA: Diagnosis not present

## 2020-11-26 DIAGNOSIS — E559 Vitamin D deficiency, unspecified: Secondary | ICD-10-CM | POA: Diagnosis not present

## 2020-11-26 DIAGNOSIS — Z0001 Encounter for general adult medical examination with abnormal findings: Secondary | ICD-10-CM

## 2020-11-26 DIAGNOSIS — I459 Conduction disorder, unspecified: Secondary | ICD-10-CM

## 2020-11-26 NOTE — Patient Instructions (Signed)
Marcus Chambers , Thank you for taking time to come for your Medicare Wellness Visit. I appreciate your ongoing commitment to your health goals. Please review the following plan we discussed and let me know if I can assist you in the future.   These are the goals we discussed: Goals    . Exercise 150 min/wk Moderate Activity    . LDL CALC < 70    . Weight (lb) < 230 lb (104.3 kg)       This is a list of the screening recommended for you and due dates:  Health Maintenance  Topic Date Due  . Hepatitis C Screening: USPSTF Recommendation to screen - Ages 40-79 yo.  Never done  . Eye exam for diabetics  06/30/2020  . Urine Protein Check  01/12/2021  . Complete foot exam   01/11/2021  . Hemoglobin A1C  01/26/2021  . Flu Shot  02/14/2021  . Colon Cancer Screening  07/21/2021  . Tetanus Vaccine  03/21/2022  . COVID-19 Vaccine  Completed  . Pneumonia vaccines  Completed  . HPV Vaccine  Aged Out       High-Fiber Eating Plan Fiber, also called dietary fiber, is a type of carbohydrate. It is found foods such as fruits, vegetables, whole grains, and beans. A high-fiber diet can have many health benefits. Your health care provider may recommend a high-fiber diet to help:  Prevent constipation. Fiber can make your bowel movements more regular.  Lower your cholesterol.  Relieve the following conditions: ? Inflammation of veins in the anus (hemorrhoids). ? Inflammation of specific areas of the digestive tract (uncomplicated diverticulosis). ? A problem of the large intestine, also called the colon, that sometimes causes pain and diarrhea (irritable bowel syndrome, or IBS).  Prevent overeating as part of a weight-loss plan.  Prevent heart disease, type 2 diabetes, and certain cancers. What are tips for following this plan? Reading food labels  Check the nutrition facts label on food products for the amount of dietary fiber. Choose foods that have 5 grams of fiber or more per serving.  The  goals for recommended daily fiber intake include: ? Men (age 22 or younger): 34-38 g. ? Men (over age 39): 28-34 g. ? Women (age 51 or younger): 25-28 g. ? Women (over age 34): 22-25 g. Your daily fiber goal is _____________ g.   Shopping  Choose whole fruits and vegetables instead of processed forms, such as apple juice or applesauce.  Choose a wide variety of high-fiber foods such as avocados, lentils, oats, and kidney beans.  Read the nutrition facts label of the foods you choose. Be aware of foods with added fiber. These foods often have high sugar and sodium amounts per serving. Cooking  Use whole-grain flour for baking and cooking.  Cook with brown rice instead of white rice. Meal planning  Start the day with a breakfast that is high in fiber, such as a cereal that contains 5 g of fiber or more per serving.  Eat breads and cereals that are made with whole-grain flour instead of refined flour or white flour.  Eat brown rice, bulgur wheat, or millet instead of white rice.  Use beans in place of meat in soups, salads, and pasta dishes.  Be sure that half of the grains you eat each day are whole grains. General information  You can get the recommended daily intake of dietary fiber by: ? Eating a variety of fruits, vegetables, grains, nuts, and beans. ? Taking a fiber  supplement if you are not able to take in enough fiber in your diet. It is better to get fiber through food than from a supplement.  Gradually increase how much fiber you consume. If you increase your intake of dietary fiber too quickly, you may have bloating, cramping, or gas.  Drink plenty of water to help you digest fiber.  Choose high-fiber snacks, such as berries, raw vegetables, nuts, and popcorn. What foods should I eat? Fruits Berries. Pears. Apples. Oranges. Avocado. Prunes and raisins. Dried figs. Vegetables Sweet potatoes. Spinach. Kale. Artichokes. Cabbage. Broccoli. Cauliflower. Green peas.  Carrots. Squash. Grains Whole-grain breads. Multigrain cereal. Oats and oatmeal. Brown rice. Barley. Bulgur wheat. [place]. Quinoa. Bran muffins. Popcorn. Rye wafer crackers. Meats and other proteins Navy beans, kidney beans, and pinto beans. Soybeans. Split peas. Lentils. Nuts and seeds. Dairy Fiber-fortified yogurt. Beverages Fiber-fortified soy milk. Fiber-fortified orange juice. Other foods Fiber bars. The items listed above may not be a complete list of recommended foods and beverages. Contact a dietitian for more information. What foods should I avoid? Fruits Fruit juice. Cooked, strained fruit. Vegetables Fried potatoes. Canned vegetables. Well-cooked vegetables. Grains White bread. Pasta made with refined flour. White rice. Meats and other proteins Fatty cuts of meat. Fried chicken or fried fish. Dairy Milk. Yogurt. Cream cheese. Sour cream. Fats and oils Butters. Beverages Soft drinks. Other foods Cakes and pastries. The items listed above may not be a complete list of foods and beverages to avoid. Talk with your dietitian about what choices are best for you. Summary  Fiber is a type of carbohydrate. It is found in foods such as fruits, vegetables, whole grains, and beans.  A high-fiber diet has many benefits. It can help to prevent constipation, lower blood cholesterol, aid weight loss, and reduce your risk of heart disease, diabetes, and certain cancers.  Increase your intake of fiber gradually. Increasing fiber too quickly may cause cramping, bloating, and gas. Drink plenty of water while you increase the amount of fiber you consume.  The best sources of fiber include whole fruits and vegetables, whole grains, nuts, seeds, and beans. This information is not intended to replace advice given to you by your health care provider. Make sure you discuss any questions you have with your health care provider. Document Revised: 11/06/2019 Document Reviewed:  11/06/2019 Elsevier Patient Education  2021 [employer].

## 2020-11-26 NOTE — Progress Notes (Signed)
MEDICARE ANNUAL WELLNESS VISIT AND FOLLOW UP Assessment:   Diagnoses and all orders for this visit:  Encounter for Medicare annual wellness exam Due annually  Hep C screening done   Essential hypertension Continue medications  Monitor blood pressure at home; call if consistently over 130/80 Continue DASH diet.   Reminder to go to the ER if any CP, SOB, nausea, dizziness, severe HA, changes vision/speech, left arm numbness and tingling and jaw pain.  Type 2 diabetes mellitus with stage 2 chronic kidney disease, without long-term current use of insulin [facility]) Education: Reviewed 'ABCs' of diabetes management (respective goals in parentheses):  A1C (<7), blood pressure (<130/80), and cholesterol (LDL <70) Eye Exam yearly and Dental Exam every 6 months. - eye exam report requested Dietary recommendations Physical Activity recommendations Foot exam UTD  CKD stage 2 due to type 2 diabetes mellitus (HCC) Increase fluids, avoid NSAIDS, monitor sugars, will monitor  Vitamin D deficiency At goal at recent check; continue to recommend supplementation for goal of 60-100 Defer vitamin D level  Obesity (BMI 34) Long discussion about weight loss, diet, and exercise Recommended diet heavy in fruits and veggies and low in animal meats, cheeses, and dairy products, appropriate calorie intake He will work on cutting down on sweets Discussed appropriate weight for height  - next weight goal of 250 lb, long term goal <190lb per patient Follow up at next visit  Medication management CBC, CMP/GFR, magnesium   Hypertensive retinopathy of both eyes Control BP, continue follow up with ophthalmology  Mixed hyperlipidemia Continue medications; currently at goal Continue low cholesterol diet and exercise.  Check lipid panel.   History of colonic polyps Up to date on colonoscopies  H/O prostate cancer (2006) Continue to monitor, follows with [facility]   Idiopathic gout, unspecified  chronicity, unspecified site Continue allopurinol Diet discussed Check uric acid as needed  Anxiety Recently off of benzo and continues to do well  Stress management techniques discussed, increase water, good sleep hygiene discussed, increase exercise, and increase veggies.   Frequent skipped beats - PVCs EKG checked - initially ? a. Fib however recheck shows p waves, frequent PAC/PVC's; he attributes to stress Discussed close monitoring, low stress, caffeine, non-smoker Monitor closely; consider rate controlling agent if persistent    Over 30 minutes of exam, counseling, chart review, and critical decision making was performed  Future Appointments  Date Time Provider [place]  03/07/2021  3:00 PM [name], MD [facility] None  11/28/2021 11:00 AM [name], NP [facility] None     Plan:   During the course of the visit the patient was educated and counseled about appropriate screening and preventive services including:    Pneumococcal vaccine   Influenza vaccine  Prevnar 13  Td vaccine  Screening electrocardiogram  Colorectal cancer screening  Diabetes screening  Glaucoma screening  Nutrition counseling    Subjective:  Marcus Chambers is a 80 y.o. male who presents for Medicare Annual Wellness Visit and 3 month follow up for HTN, hyperlipidemia, T2DM, and vitamin D Def.   He has hx of prostate cancer circa 2010, underwent radiation, was followed annually by [facility] until 2019 and now monitored at this office. PSAs have been stable, denies urinary sx.   he has a diagnosis of anxiety/insomnia formerly on alprazolam, was able to taper off, hasn't been filled since 07/2019.   BMI is Body mass index is 34.28 kg/m., he has been working on diet, plans to join [employer] eventually, currently walking  on treadmill 30 min 2-3 days/week.  Wt Readings from Last 3 Encounters:  11/26/20 267 lb (121.1 kg)  07/29/20 259 lb 3.2 oz (117.6 kg)   06/21/20 264 lb (119.7 kg)   His blood pressure has been controlled at home (130/75), today their BP is BP: 132/72 He does workout. He denies chest pain, shortness of breath, dizziness.   He is on cholesterol medication (atorvastatin 20 mg daily) and denies myalgias. His cholesterol is at goal. The cholesterol last visit was:   Lab Results  Component Value Date   CHOL 157 07/29/2020   HDL 73 07/29/2020   LDLCALC 69 07/29/2020   TRIG 71 07/29/2020   CHOLHDL 2.2 07/29/2020   He has been working on diet and exercise for T2 diabetes controlled on metformin 2000 mg daily, and denies foot ulcerations, increased appetite, nausea, paresthesia of the feet, polydipsia, polyuria, visual disturbances, vomiting and weight loss. He checks sugars 2-3 days a week, "hasn't been bad" "safe are on meter."  Last A1C in the office was:  Lab Results  Component Value Date   HGBA1C 6.1 (H) 07/29/2020   He has CKD II associated with T2DM monitored at this office:  Lab Results  Component Value Date   GFRAA 79 07/29/2020    Patient is on Vitamin D supplement and at goal at recent check:   Lab Results  Component Value Date   VD25OH 93 07/29/2020     Patient is on allopurinol for gout and does not report a recent flare.  Lab Results  Component Value Date   LABURIC 3.7 (L) 07/29/2020     Medication Review:  Current Outpatient Medications (Endocrine & Metabolic):  .  metFORMIN (GLUCOPHAGE-XR) 500 MG 24 hr tablet, TAKE 2 TABLETS BY MOUTH TWICE DAILY WITH MEALS FOR DIABETES  Current Outpatient Medications (Cardiovascular):  .  amLODipine (NORVASC) 5 MG tablet, Take  1 tablet  Daily  for BP       Take 1 tablet by mouth once daily for blood pressure .  atorvastatin (LIPITOR) 20 MG tablet, TAKE 1 TABLET BY MOUTH ONCE DAILY FOR CHOLESTEROL .  bumetanide (BUMEX) 2 MG tablet, Take     1 tablet      2x  /day for BP and  Fluid Retention /Ankle Swelling  Current Outpatient Medications (Respiratory):  Marland Kitchen   Azelastine HCl 0.15 % SOLN, 2 sprays each Nostril 2 x /day .  mometasone (NASONEX) 50 MCG/ACT nasal spray, Place 1 spray into the nose 2 (two) times daily as needed. 1 spray into each nostril. .  montelukast (SINGULAIR) 10 MG tablet, TAKE 1 TABLET BY MOUTH ONCE DAILY FOR ALLERGIES  Current Outpatient Medications (Analgesics):  .  allopurinol (ZYLOPRIM) 300 MG tablet, TAKE 1 TABLET BY MOUTH ONCE DAILY TO  PREVENT  GOUT .  aspirin 81 MG tablet, Take 81 mg by mouth daily.   Current Outpatient Medications (Other):  Marland Kitchen  Ascorbic Acid (VITAMIN C) 1000 MG tablet, Take 1,000 mg by mouth daily. .  blood glucose meter kit and supplies KIT, CHECK BLOOD SUGAR 1 TIME DAILY-E11.9 .  Cholecalciferol (VITAMIN D PO), Take 10,000 Units by mouth.  Marland Kitchen  glucose blood (ONETOUCH ULTRA) test strip, Test blood sugar once daily .  Lancets MISC, CHECK BLOOD SUGAR 1 TIME DAILY-E11.9 .  MAGNESIUM PO, Take 500 mg by mouth 2 (two) times daily.  Marland Kitchen  zinc gluconate 50 MG tablet, Take 50 mg by mouth daily.  Allergies: Allergies  Allergen Reactions  . Ace Inhibitors  Current Problems (verified) has History of colonic polyps; Type 2 diabetes mellitus ([place]); Hyperlipidemia associated with type 2 diabetes mellitus ([place]); Gout; Vitamin D deficiency; Essential hypertension; Medication management; H/O prostate cancer (2006); Obesity (BMI 30.0-34.9); Hypertensive retinopathy; CKD stage 2 due to type 2 diabetes mellitus ([place]); Situational anxiety ; and ACEI/ARB contraindicated on their problem list.  Screening Tests Immunization History  Administered Date(s) Administered  . DT (Pediatric) 03/21/2012  . Influenza, High Dose Seasonal PF 03/27/2019  . Influenza-Unspecified 04/16/2014, 04/16/2015, 04/16/2018  . Moderna Sars-Covid-2 Vaccination 08/07/2019, 09/03/2019, 05/22/2020, 10/27/2020  . Pneumococcal Conjugate-13 05/19/2014  . Pneumococcal Polysaccharide-23 07/24/2007    Preventative care: Last colonoscopy: 2016 due  07/2021 - Dr. Deatra Ina  Prior vaccinations: TD or Tdap: 2013  Influenza: 2021 had at another site Pneumococcal: 2009  Prevnar13: 2015 Shingles/Zostavax: declines, cost Covid 19: 2/2 2021  Names of Other Physician/Practitioners you currently use: 1. [facility] here for primary care 2. Dr. Rosana Hoes in [place] [facility]), eye doctor, last visit 2021, report requested 3. Dr. , dentist, overdue - looking for dentist right now  Patient Care Team: [name], MD as PCP - General (Internal Medicine) Inda Castle, MD (Inactive) as Consulting Physician (Gastroenterology) Rana Snare, MD (Inactive) as Consulting Physician (Urology) Johnathan Hausen, MD as Consulting Physician (General Surgery)  Surgical: He  has a past surgical history that includes Appendectomy (1994); Cholecystectomy (1994); Knee surgery (1996); and colovesical fistula repair (01/2011). Family His family history is not on file. Social history  He reports that he quit smoking about 35 years ago. He has never used smokeless tobacco. He reports that he does not drink alcohol and does not use drugs.  MEDICARE WELLNESS OBJECTIVES: Physical activity: Current Exercise Habits: The patient does not participate in regular exercise at present, Exercise limited by: None identified Cardiac risk factors: Cardiac Risk Factors include: advanced age (>33mn, >>12women);dyslipidemia;hypertension;male gender;sedentary lifestyle;smoking/ tobacco exposure;diabetes mellitus Depression/mood screen:   [facility] 11/26/2020  Decreased Interest 0  Down, Depressed, Hopeless 0  PHQ - 2 Score 0  Altered sleeping -  Tired, decreased energy -  Change in appetite -  Feeling bad or failure about yourself  -  Trouble concentrating -  Moving slowly or fidgety/restless -  Suicidal thoughts -  PHQ-9 Score -  Difficult doing work/chores -    ADLs:  In your present state of health, do you have  any difficulty performing the following activities: 11/26/2020 01/12/2020  Hearing? N N  Vision? N N  Difficulty concentrating or making decisions? N N  Walking or climbing stairs? N N  Dressing or bathing? N N  Doing errands, shopping? N N  Some recent data might be hidden     Cognitive Testing  Alert? Yes  Normal Appearance?Yes  Oriented to person? Yes  Place? Yes   Time? Yes  Recall of three objects?  Yes  Can perform simple calculations? Yes  Displays appropriate judgment?Yes  Can read the correct time from a watch face?Yes  EOL planning: Does Patient Have a Medical Advance Directive?: Yes [place] Does patient want to make changes to medical advance directive?: No - Patient declined [place] of [place]?: No - copy requested   Objective:   Today's Vitals   11/26/20 1050  BP: 132/72  Pulse: (!) 46  Temp: (!) 97.3 F (36.3 C)  SpO2: 97%  Weight: 267 lb (121.1 kg)   Body  mass index is 34.28 kg/m.  General appearance: alert, no distress, WD/WN, obese male HEENT: normocephalic, sclerae anicteric, TMs pearly, nares patent, no discharge or erythema, pharynx normal Oral cavity: MMM, no lesions Neck: supple, no lymphadenopathy, no thyromegaly, no masses Heart: Rate regular, skipping every 2-3 beats, normal S1, S2, no murmurs Lungs: CTA bilaterally, no wheezes, rhonchi, or rales Abdomen: +bs, soft, pendulous, non tender, non distended, no masses, no hepatomegaly, no splenomegaly Musculoskeletal: nontender, no swelling, no obvious deformity Extremities: no edema, no cyanosis, no clubbing. Toe nails long, curved, thickened.  Pulses: 2+ symmetric, upper and lower extremities, normal cap refill Neurological: alert, oriented x 3, CN2-12 intact, strength normal upper extremities and lower extremities, sensation normal throughout, DTRs 2+ throughout, no cerebellar signs, gait slow  steady Psychiatric: normal affect, behavior normal, pleasant   EKG: frequent PAC/PVC's, NSR, no a. Fib -   Medicare Attestation I have personally reviewed: The patient's medical and social history Their use of alcohol, tobacco or illicit drugs Their current medications and supplements The patient's functional ability including ADLs,fall risks, home safety risks, cognitive, and hearing and visual impairment Diet and physical activities Evidence for depression or mood disorders  The patient's weight, height, BMI, and visual acuity have been recorded in the chart.  I have made referrals, counseling, and provided education to the patient based on review of the above and I have provided the patient with a written personalized care plan for preventive services.     Izora Ribas, NP   11/26/2020

## 2020-11-29 LAB — CBC WITH DIFFERENTIAL/PLATELET
Absolute Monocytes: 556 cells/uL (ref 200–950)
Basophils Absolute: 60 cells/uL (ref 0–200)
Basophils Relative: 0.9 %
Eosinophils Absolute: 141 cells/uL (ref 15–500)
Eosinophils Relative: 2.1 %
HCT: 39.2 % (ref 38.5–50.0)
Hemoglobin: 12.7 g/dL — ABNORMAL LOW (ref 13.2–17.1)
Lymphs Abs: 1441 cells/uL (ref 850–3900)
MCH: 26.1 pg — ABNORMAL LOW (ref 27.0–33.0)
MCHC: 32.4 g/dL (ref 32.0–36.0)
MCV: 80.7 fL (ref 80.0–100.0)
MPV: 10.8 fL (ref 7.5–12.5)
Monocytes Relative: 8.3 %
Neutro Abs: 4502 cells/uL (ref 1500–7800)
Neutrophils Relative %: 67.2 %
Platelets: 244 10*3/uL (ref 140–400)
RBC: 4.86 10*6/uL (ref 4.20–5.80)
RDW: 14.9 % (ref 11.0–15.0)
Total Lymphocyte: 21.5 %
WBC: 6.7 10*3/uL (ref 3.8–10.8)

## 2020-11-29 LAB — COMPLETE METABOLIC PANEL WITH GFR
AG Ratio: 1.1 (calc) (ref 1.0–2.5)
ALT: 34 U/L (ref 9–46)
AST: 27 U/L (ref 10–35)
Albumin: 4.3 g/dL (ref 3.6–5.1)
Alkaline phosphatase (APISO): 83 U/L (ref 35–144)
BUN: 12 mg/dL (ref 7–25)
CO2: 28 mmol/L (ref 20–32)
Calcium: 10.5 mg/dL — ABNORMAL HIGH (ref 8.6–10.3)
Chloride: 102 mmol/L (ref 98–110)
Creat: 1.08 mg/dL (ref 0.70–1.18)
GFR, Est African American: 75 mL/min/{1.73_m2} (ref >=60)
GFR, Est Non African American: 65 mL/min/{1.73_m2} (ref >=60)
Globulin: 3.8 g/dL (calc) — ABNORMAL HIGH (ref 1.9–3.7)
Glucose, Bld: 102 mg/dL — ABNORMAL HIGH (ref 65–99)
Potassium: 3.8 mmol/L (ref 3.5–5.3)
Sodium: 139 mmol/L (ref 135–146)
Total Bilirubin: 0.6 mg/dL (ref 0.2–1.2)
Total Protein: 8.1 g/dL (ref 6.1–8.1)

## 2020-11-29 LAB — LIPID PANEL
Cholesterol: 165 mg/dL (ref <200)
HDL: 77 mg/dL (ref >=40)
LDL Cholesterol (Calc): 73 mg/dL (calc)
Non-HDL Cholesterol (Calc): 88 mg/dL (calc) (ref <130)
Total CHOL/HDL Ratio: 2.1 (calc) (ref <5.0)
Triglycerides: 70 mg/dL (ref <150)

## 2020-11-29 LAB — HEMOGLOBIN A1C
Hgb A1c MFr Bld: 5.9 % of total Hgb — ABNORMAL HIGH (ref <5.7)
Mean Plasma Glucose: 123 mg/dL
eAG (mmol/L): 6.8 mmol/L

## 2020-11-29 LAB — HEPATITIS C ANTIBODY
Hepatitis C Ab: NONREACTIVE
SIGNAL TO CUT-OFF: 0.03 (ref <1.00)

## 2020-11-29 LAB — MAGNESIUM: Magnesium: 1.6 mg/dL (ref 1.5–2.5)

## 2020-11-29 LAB — TSH: TSH: 1.03 mIU/L (ref 0.40–4.50)

## 2020-11-29 NOTE — Progress Notes (Signed)
PATIENT IS AWARE OF LAB RESULTS AND INSTRUCTIONS. HE STATES THAT HE IS STILL TAKING MAGNESIUM TWICE DAILY WITH THE EXCEPTION OF MISSING A COUPLE DAYS LATELY. NO DIARRHEA. [facility]

## 2020-12-19 ENCOUNTER — Other Ambulatory Visit: Payer: Self-pay | Admitting: Internal Medicine

## 2020-12-19 DIAGNOSIS — I10 Essential (primary) hypertension: Secondary | ICD-10-CM

## 2021-01-06 ENCOUNTER — Other Ambulatory Visit: Payer: Self-pay | Admitting: Internal Medicine

## 2021-01-13 ENCOUNTER — Ambulatory Visit: Payer: PPO | Admitting: Adult Health

## 2021-01-30 ENCOUNTER — Other Ambulatory Visit: Payer: Self-pay | Admitting: Internal Medicine

## 2021-01-30 MED ORDER — IPRATROPIUM BROMIDE 0.06 % NA SOLN: NASAL | 3 refills | Status: DC

## 2021-03-02 DIAGNOSIS — H524 Presbyopia: Secondary | ICD-10-CM | POA: Diagnosis not present

## 2021-03-02 DIAGNOSIS — H31093 Other chorioretinal scars, bilateral: Secondary | ICD-10-CM | POA: Diagnosis not present

## 2021-03-02 LAB — HM DIABETES EYE EXAM

## 2021-03-06 ENCOUNTER — Encounter: Payer: Self-pay | Admitting: Internal Medicine

## 2021-03-06 DIAGNOSIS — E213 Hyperparathyroidism, unspecified: Secondary | ICD-10-CM | POA: Insufficient documentation

## 2021-03-06 DIAGNOSIS — Z8249 Family history of ischemic heart disease and other diseases of the circulatory system: Secondary | ICD-10-CM | POA: Insufficient documentation

## 2021-03-06 DIAGNOSIS — N138 Other obstructive and reflux uropathy: Secondary | ICD-10-CM | POA: Insufficient documentation

## 2021-03-06 NOTE — Progress Notes (Signed)
Annual  Screening/Preventative Visit  & Comprehensive Evaluation & Examination  Future Appointments  Date Time Provider [place]  03/07/2021 - CPE   3:00 PM [name], MD [facility] None  11/28/2021 - Wellness 11:00 AM [name], NP [facility] None  03/07/2022 - CPE  3:00 PM [name], MD [facility] None            This very nice 80 y.o. DBM presents for a Screening /Preventative Visit & comprehensive evaluation and management of multiple medical co-morbidities.  Patient has been followed for HTN, HLD, T2_NIDDM  and Vitamin D Deficiency. Patient's Gout is controlled on Allopurinol.  Patient has remote hx/o Prostate Ca treated by radiation & in remission.        HTN predates since 1989. Patient's BP has been controlled at home.  Today's BP is at goal - 134/68.  In 2008 , he had a negative heart cath after a False (+) Cardiolite. Patient denies any cardiac symptoms as chest pain, palpitations, shortness of breath, dizziness or ankle swelling.       Patient's hyperlipidemia is controlled with diet and Atorvastatin. Patient denies myalgias or other medication SE's. Last lipids were at goal:  Lab Results  Component Value Date   CHOL 165 11/26/2020   HDL 77 11/26/2020   LDLCALC 73 11/26/2020   TRIG 70 11/26/2020   CHOLHDL 2.1 11/26/2020         Patient has Moderate Obesity (BMI 34+) and T2_NIDDM (2005) with CKD2  (GFR 75) and patient denies reactive hypoglycemic symptoms, visual blurring, diabetic polys or paresthesias. Last A1c was near goal:   Lab Results  Component Value Date   HGBA1C 5.9 (H) 11/26/2020          Finally, patient has history of Vitamin D Deficiency ("29" /2008) and last vitamin D was at goal:   Last vitamin D Lab Results  Component Value Date   VD25OH 93 07/29/2020    Lab Results  Component  No current facility-administered medications on file prior to visit.     VD25OH   Outpatient Medications Prior to Visit  Medication  Sig   allopurinol (ZYLOPRIM) 300 MG tablet TAKE 1 TABLET BY MOUTH ONCE DAILY TO  PREVENT  GOUT   amLODipine (NORVASC) 5 MG tablet Take 1 tablet  Daily for BP   Ascorbic Acid (VITAMIN C) 1000 MG tablet Take 1,000 mg by mouth daily.   aspirin 81 MG tablet Take 81 mg by mouth daily.   atorvastatin (LIPITOR) 20 MG tablet TAKE 1 TABLET BY MOUTH ONCE DAILY FOR CHOLESTEROL   bumetanide (BUMEX) 2 MG tablet Take     1 tablet      2x  /day for BP and  Fluid Retention /Ankle Swelling   Cholecalciferol (VITAMIN D PO) Take 10,000 Units by mouth.    glucose blood (ONETOUCH ULTRA) test strip Test blood sugar once daily   ipratropium (ATROVENT) 0.06 % nasal spray Use 1 to 2 sprays each nostril 2 to 3 x /day as needed   Lancets MISC CHECK BLOOD SUGAR 1 TIME DAILY-E11.9   MAGNESIUM PO Take 500 mg by mouth 2 (two) times daily.    metFORMIN (GLUCOPHAGE-XR) 500 MG 24 hr tablet TAKE 2 TABLETS BY MOUTH TWICE DAILY WITH MEALS FOR DIABETES   mometasone (NASONEX) 50 MCG/ACT nasal spray Place 1 spray into the nose 2 (two) times daily as needed. 1 spray into each nostril.   montelukast (SINGULAIR) 10 MG tablet TAKE 1 TABLET BY MOUTH ONCE DAILY  FOR ALLERGIES   zinc gluconate 50 MG tablet Take 50 mg by mouth daily.     Allergies  Allergen Reactions   Ace Inhibitors      Past Medical History:  Diagnosis Date   Adenomatous colon polyp    2010   Gout    Hyperlipidemia    Hypertension    Prostate cancer ([place]) 2010   treated by external beam radiation   Type II or unspecified type diabetes mellitus without mention of complication, not stated as uncontrolled    Vitamin D deficiency      Health Maintenance  Topic Date Due   Zoster Vaccines- Shingrix (1 of 2) Never done   OPHTHALMOLOGY EXAM  06/30/2020   FOOT EXAM  06/Marcus/2022   URINE MICROALBUMIN  01/12/2021   COVID-19 Vaccine 5 - Booster for Moderna series) 02/26/2021   INFLUENZA VACCINE  02/14/2021   HEMOGLOBIN A1C  05/29/2021   COLONOSCOPY  07/21/2021    TETANUS/TDAP  03/21/2022   Hepatitis C Screening  Completed   PNA vac Low Risk Adult  Completed   HPV VACCINES  Aged Out     Immunization History  Administered Date(s) Administered   DT  03/21/2012   Influenza, High Dose  03/27/2019   Influenza-Unspecified 04/16/2014, 04/16/2015, 04/16/2018   Moderna Sars-Covid-2 Vacc 08/07/2019, 09/03/2019, 05/22/2020, 10/27/2020   Pneumococcal -13 05/19/2014   Pneumococcal -23 07/24/2007    Last Colon -  07/21/2014 - Dr Deatra Ina - recommended 7 yr f/u - due Jan 2023  Past Surgical History:  Procedure Laterality Date   APPENDECTOMY  1994   and [place]   colovesical fistula repair  01/2011   KNEE SURGERY  1996     Family History  Problem Relation Age of Onset   Colon cancer Neg Hx    Esophageal cancer Neg Hx    Rectal cancer Neg Hx    Stomach cancer Neg Hx     Social History   Socioeconomic History   Marital status: Divorced    Spouse name: Not on file   Number of children: Not on file   Years of education: Not on file   Highest education level: Not on file  Occupational History   Retired  Tobacco Use   Smoking status: Former    Types: Cigarettes    Quit date: 07/17/1985    Years since quitting: 35.6   Smokeless tobacco: Never  Substance and Sexual Activity   Alcohol use: No    Alcohol/week: 0.0 standard drinks   Drug use: No   Sexual activity: Not on file     ROS Constitutional: Denies fever, chills, weight loss/gain, headaches, insomnia,  night sweats or change in appetite. Does c/o fatigue. Eyes: Denies redness, blurred vision, diplopia, discharge, itchy or watery eyes.  ENT: Denies discharge, congestion, post nasal drip, epistaxis, sore throat, earache, hearing loss, dental pain, Tinnitus, Vertigo, Sinus pain or snoring.  Cardio: Denies chest pain, palpitations, irregular heartbeat, syncope, dyspnea, diaphoresis, orthopnea, PND, claudication or edema Respiratory: denies cough, dyspnea,  DOE, pleurisy, hoarseness, laryngitis or wheezing.  Gastrointestinal: Denies dysphagia, heartburn, reflux, water brash, pain, cramps, nausea, vomiting, bloating, diarrhea, constipation, hematemesis, melena, hematochezia, jaundice or hemorrhoids Genitourinary: Denies dysuria, frequency,  discharge, hematuria or flank pain. Has urgency, nocturia x 2-3 & occasional hesitancy. Musculoskeletal: Denies arthralgia, myalgia, stiffness, Jt. Swelling, pain, limp or strain/sprain. Denies Falls. Skin: Denies puritis, rash, hives, warts, acne, eczema or change in skin lesion Neuro: No weakness, tremor, incoordination, spasms, paresthesia  or pain Psychiatric: Denies confusion, memory loss or sensory loss. Denies Depression. Endocrine: Denies change in weight, skin, hair change, nocturia, and paresthesia, diabetic polys, visual blurring or hyper / hypo glycemic episodes.  Heme/Lymph: No excessive bleeding, bruising or enlarged lymph nodes.   Physical Exam  BP 134/68   Pulse 71   Temp 97.8 F (36.6 C)   Resp 16   Ht '6\' 2"'$  (1.88 m)   Wt 259 lb 12.8 oz (117.8 kg)   SpO2 97%   BMI 33.36 kg/m   General Appearance: Over nourished and well groomed and in no apparent distress.  Eyes: PERRLA, EOMs, conjunctiva no swelling or erythema, normal fundi and vessels. Sinuses: No frontal/maxillary tenderness ENT/Mouth: EACs patent / TMs  nl. Nares clear without erythema, swelling, mucoid exudates. Oral hygiene is good. No erythema, swelling, or exudate. Tongue normal, non-obstructing. Tonsils not swollen or erythematous. Hearing normal.  Neck: Supple, thyroid not palpable. No bruits, nodes or JVD. Respiratory: Respiratory effort normal.  BS equal and clear bilateral without rales, rhonci, wheezing or stridor. Cardio: Heart sounds are normal with regular rate and rhythm and no murmurs, rubs or gallops. Peripheral pulses are normal and equal bilaterally without edema. No aortic or femoral bruits. Chest: symmetric with  normal excursions and percussion.  Abdomen: Soft, with Nl bowel sounds. Nontender, no guarding, rebound, hernias, masses, or organomegaly.  Lymphatics: Non tender without lymphadenopathy.  Musculoskeletal: Full ROM all peripheral extremities, joint stability, 5/5 strength, and normal gait. Skin: Warm and dry without rashes, lesions, cyanosis, clubbing or  ecchymosis.  Neuro: Cranial nerves intact, reflexes equal bilaterally. Normal muscle tone, no cerebellar symptoms. Sensation intact.  Pysch: Alert and oriented X 3 with normal affect, insight and judgment appropriate.   Assessment and Plan  1. Annual Preventative/Screening Exam   2. Essential hypertension  - EKG 12-Lead - [place], RETROPERITNL ABD,  LTD - Urinalysis, Routine w reflex microscopic - Microalbumin / creatinine urine ratio - CBC with Differential/Platelet - COMPLETE METABOLIC PANEL WITH GFR - Magnesium - TSH  3. Hyperlipidemia associated with type 2 diabetes mellitus ([place])  - EKG 12-Lead - [place], RETROPERITNL ABD,  LTD - Lipid panel - TSH  4. Type 2 diabetes mellitus with stage 2 chronic kidney  disease, without long-term current use of insulin (HCC)  - EKG 12-Lead - [place], RETROPERITNL ABD,  LTD - HM DIABETES FOOT EXAM - LOW EXTREMITY NEUR EXAM DOCUM - Hemoglobin A1c - Insulin, random  5. Vitamin D deficiency  - VITAMIN D 25 Hydroxy   6. Idiopathic gout  - Uric acid  7. BPH with obstruction/lower urinary tract symptoms  - PSA  8. H/O prostate cancer (2006)  - PSA  9. Screening for colorectal cancer  - POC Hemoccult Bld/Stl   10. Prostate cancer screening  - PSA  11. Screening for ischemic heart disease  - EKG 12-Lead  12. FHx: heart disease  - EKG 12-Lead - [place], RETROPERITNL ABD,  LTD  13. Former smoker  - EKG 12-Lead - [place], RETROPERITNL ABD,  LTD  14. Screening for AAA (aortic abdominal aneurysm)  - [place], RETROPERITNL ABD,  LTD  15. Medication management - Urinalysis, Routine w reflex  microscopic - Microalbumin / creatinine urine ratio - Uric acid - CBC with Differential/Platelet - COMPLETE METABOLIC PANEL WITH GFR - Magnesium - Lipid panel - TSH - Hemoglobin A1c - Insulin, random        Patient was counseled in prudent diet, weight control to achieve/maintain BMI less than 25, BP monitoring,  regular exercise and medications as discussed.  Discussed med effects and SE's. Routine screening labs and tests as requested with regular follow-up as recommended. Over 40 minutes of exam, counseling, chart review and high complex critical decision making was performed   Kirtland Bouchard, MD

## 2021-03-06 NOTE — Patient Instructions (Signed)

## 2021-03-07 ENCOUNTER — Other Ambulatory Visit: Payer: Self-pay

## 2021-03-07 ENCOUNTER — Ambulatory Visit (INDEPENDENT_AMBULATORY_CARE_PROVIDER_SITE_OTHER): Payer: PPO | Admitting: Internal Medicine

## 2021-03-07 ENCOUNTER — Encounter: Payer: Self-pay | Admitting: Internal Medicine

## 2021-03-07 VITALS — BP 134/68 | HR 71 | Temp 97.8°F | Resp 16 | Ht 74.0 in | Wt 259.8 lb

## 2021-03-07 DIAGNOSIS — E559 Vitamin D deficiency, unspecified: Secondary | ICD-10-CM

## 2021-03-07 DIAGNOSIS — I10 Essential (primary) hypertension: Secondary | ICD-10-CM | POA: Diagnosis not present

## 2021-03-07 DIAGNOSIS — Z125 Encounter for screening for malignant neoplasm of prostate: Secondary | ICD-10-CM | POA: Diagnosis not present

## 2021-03-07 DIAGNOSIS — Z8546 Personal history of malignant neoplasm of prostate: Secondary | ICD-10-CM | POA: Diagnosis not present

## 2021-03-07 DIAGNOSIS — Z0001 Encounter for general adult medical examination with abnormal findings: Secondary | ICD-10-CM

## 2021-03-07 DIAGNOSIS — E1169 Type 2 diabetes mellitus with other specified complication: Secondary | ICD-10-CM | POA: Diagnosis not present

## 2021-03-07 DIAGNOSIS — Z79899 Other long term (current) drug therapy: Secondary | ICD-10-CM | POA: Diagnosis not present

## 2021-03-07 DIAGNOSIS — E785 Hyperlipidemia, unspecified: Secondary | ICD-10-CM | POA: Diagnosis not present

## 2021-03-07 DIAGNOSIS — Z136 Encounter for screening for cardiovascular disorders: Secondary | ICD-10-CM | POA: Diagnosis not present

## 2021-03-07 DIAGNOSIS — Z8249 Family history of ischemic heart disease and other diseases of the circulatory system: Secondary | ICD-10-CM | POA: Diagnosis not present

## 2021-03-07 DIAGNOSIS — N182 Chronic kidney disease, stage 2 (mild): Secondary | ICD-10-CM | POA: Diagnosis not present

## 2021-03-07 DIAGNOSIS — M1000 Idiopathic gout, unspecified site: Secondary | ICD-10-CM | POA: Diagnosis not present

## 2021-03-07 DIAGNOSIS — Z87891 Personal history of nicotine dependence: Secondary | ICD-10-CM

## 2021-03-07 DIAGNOSIS — Z0000 Encounter for general adult medical examination without abnormal findings: Secondary | ICD-10-CM | POA: Diagnosis not present

## 2021-03-07 DIAGNOSIS — N401 Enlarged prostate with lower urinary tract symptoms: Secondary | ICD-10-CM

## 2021-03-07 DIAGNOSIS — N138 Other obstructive and reflux uropathy: Secondary | ICD-10-CM

## 2021-03-07 DIAGNOSIS — E1122 Type 2 diabetes mellitus with diabetic chronic kidney disease: Secondary | ICD-10-CM | POA: Diagnosis not present

## 2021-03-07 DIAGNOSIS — Z1211 Encounter for screening for malignant neoplasm of colon: Secondary | ICD-10-CM

## 2021-03-07 DIAGNOSIS — Z1212 Encounter for screening for malignant neoplasm of rectum: Secondary | ICD-10-CM

## 2021-03-08 LAB — URINALYSIS, ROUTINE W REFLEX MICROSCOPIC
Bilirubin Urine: NEGATIVE
Glucose, UA: NEGATIVE
Hgb urine dipstick: NEGATIVE
Leukocytes,Ua: NEGATIVE
Nitrite: NEGATIVE
Protein, ur: NEGATIVE
Specific Gravity, Urine: 1.027 (ref 1.001–1.035)
pH: <=5 (ref 5.0–8.0)

## 2021-03-08 LAB — COMPLETE METABOLIC PANEL WITH GFR
AG Ratio: 1.4 (calc) (ref 1.0–2.5)
ALT: 17 U/L (ref 9–46)
AST: 16 U/L (ref 10–35)
Albumin: 4 g/dL (ref 3.6–5.1)
Alkaline phosphatase (APISO): 63 U/L (ref 35–144)
BUN: 13 mg/dL (ref 7–25)
CO2: 27 mmol/L (ref 20–32)
Calcium: 10.3 mg/dL (ref 8.6–10.3)
Chloride: 106 mmol/L (ref 98–110)
Creat: 1.01 mg/dL (ref 0.70–1.28)
Globulin: 2.9 g/dL (calc) (ref 1.9–3.7)
Glucose, Bld: 90 mg/dL (ref 65–99)
Potassium: 4.4 mmol/L (ref 3.5–5.3)
Sodium: 139 mmol/L (ref 135–146)
Total Bilirubin: 0.5 mg/dL (ref 0.2–1.2)
Total Protein: 6.9 g/dL (ref 6.1–8.1)
eGFR: 76 mL/min/{1.73_m2} (ref >=60)

## 2021-03-08 LAB — CBC WITH DIFFERENTIAL/PLATELET
Absolute Monocytes: 390 cells/uL (ref 200–950)
Basophils Absolute: 43 cells/uL (ref 0–200)
Basophils Relative: 0.7 %
Eosinophils Absolute: 153 cells/uL (ref 15–500)
Eosinophils Relative: 2.5 %
HCT: 35.9 % — ABNORMAL LOW (ref 38.5–50.0)
Hemoglobin: 11.4 g/dL — ABNORMAL LOW (ref 13.2–17.1)
Lymphs Abs: 1366 cells/uL (ref 850–3900)
MCH: 26.6 pg — ABNORMAL LOW (ref 27.0–33.0)
MCHC: 31.8 g/dL — ABNORMAL LOW (ref 32.0–36.0)
MCV: 83.9 fL (ref 80.0–100.0)
MPV: 11.3 fL (ref 7.5–12.5)
Monocytes Relative: 6.4 %
Neutro Abs: 4148 cells/uL (ref 1500–7800)
Neutrophils Relative %: 68 %
Platelets: 217 10*3/uL (ref 140–400)
RBC: 4.28 10*6/uL (ref 4.20–5.80)
RDW: 16.3 % — ABNORMAL HIGH (ref 11.0–15.0)
Total Lymphocyte: 22.4 %
WBC: 6.1 10*3/uL (ref 3.8–10.8)

## 2021-03-08 LAB — LIPID PANEL
Cholesterol: 172 mg/dL (ref <200)
HDL: 60 mg/dL (ref >=40)
LDL Cholesterol (Calc): 98 mg/dL (calc)
Non-HDL Cholesterol (Calc): 112 mg/dL (calc) (ref <130)
Total CHOL/HDL Ratio: 2.9 (calc) (ref <5.0)
Triglycerides: 58 mg/dL (ref <150)

## 2021-03-08 LAB — PSA: PSA: 0.25 ng/mL (ref <=4.00)

## 2021-03-08 LAB — HEMOGLOBIN A1C
Hgb A1c MFr Bld: 5.9 % of total Hgb — ABNORMAL HIGH (ref <5.7)
Mean Plasma Glucose: 123 mg/dL
eAG (mmol/L): 6.8 mmol/L

## 2021-03-08 LAB — VITAMIN D 25 HYDROXY (VIT D DEFICIENCY, FRACTURES): Vit D, 25-Hydroxy: 88 ng/mL (ref 30–100)

## 2021-03-08 LAB — MICROALBUMIN / CREATININE URINE RATIO
Creatinine, Urine: 240 mg/dL (ref 20–320)
Microalb Creat Ratio: 7 mcg/mg creat (ref <30)
Microalb, Ur: 1.6 mg/dL

## 2021-03-08 LAB — MAGNESIUM: Magnesium: 1.7 mg/dL (ref 1.5–2.5)

## 2021-03-08 LAB — URIC ACID: Uric Acid, Serum: 4 mg/dL (ref 4.0–8.0)

## 2021-03-08 LAB — TSH: TSH: 1.28 mIU/L (ref 0.40–4.50)

## 2021-03-08 LAB — INSULIN, RANDOM: Insulin: 6.4 u[IU]/mL

## 2021-03-08 NOTE — Progress Notes (Signed)
============================================================ ============================================================  -    PSA - Low - Great ! ============================================================ ============================================================  -  Uric Acid / Gout test - Normal - Please continue Allopurinol  ============================================================ ============================================================  -  CBC shows mild anemia - will monitor at subsequent OV's.   - Also suggest be sure & take a                         Super B Complex, Magnesium  & Iron Supplement,  because   Metformin blocks Iron, Magnesium & Vit B12 absorption, so have to take extra  ============================================================ ============================================================  -   Magnesium  -  1.7    -  very  low- goal is betw 2.0 - 2.5,   - So..........Marland Kitchen  Recommend that you take # 2 tablets of Magnesium 500 mg/daily   - also important to eat lots of  leafy green vegetables   - spinach - Kale - collards - greens - okra - asparagus  - broccoli - quinoa - squash - almonds   - black, red, white beans  -  peas - green beans ============================================================ ============================================================  -  Total Chol = 172     &   LDL Chol = 98   - Both    Excellent   - Very low risk for Heart Attack  / Stroke ============================================================ ============================================================  -  A1c = 5.9% - Great ! ============================================================ ============================================================  -  Vitamin D = 88 - Excellent   ! ============================================================ ============================================================  -  Keep up the [place] Work  !  ============================================================ ============================================================

## 2021-03-13 ENCOUNTER — Other Ambulatory Visit: Payer: Self-pay | Admitting: Internal Medicine

## 2021-03-13 MED ORDER — DICYCLOMINE HCL 20 MG PO TABS: ORAL_TABLET | ORAL | 0 refills | Status: AC

## 2021-03-28 ENCOUNTER — Other Ambulatory Visit: Payer: Self-pay

## 2021-03-28 DIAGNOSIS — Z1211 Encounter for screening for malignant neoplasm of colon: Secondary | ICD-10-CM

## 2021-03-28 DIAGNOSIS — Z1212 Encounter for screening for malignant neoplasm of rectum: Secondary | ICD-10-CM

## 2021-03-28 LAB — POC HEMOCCULT BLD/STL (HOME/3-CARD/SCREEN)
Card #2 Fecal Occult Blod, POC: NEGATIVE
Card #3 Fecal Occult Blood, POC: NEGATIVE
Fecal Occult Blood, POC: NEGATIVE

## 2021-03-30 DIAGNOSIS — Z1211 Encounter for screening for malignant neoplasm of colon: Secondary | ICD-10-CM | POA: Diagnosis not present

## 2021-04-24 ENCOUNTER — Other Ambulatory Visit: Payer: Self-pay | Admitting: Internal Medicine

## 2021-04-24 ENCOUNTER — Other Ambulatory Visit: Payer: Self-pay | Admitting: Adult Health Nurse Practitioner

## 2021-04-24 DIAGNOSIS — E1169 Type 2 diabetes mellitus with other specified complication: Secondary | ICD-10-CM

## 2021-04-24 DIAGNOSIS — J302 Other seasonal allergic rhinitis: Secondary | ICD-10-CM

## 2021-04-24 DIAGNOSIS — E785 Hyperlipidemia, unspecified: Secondary | ICD-10-CM

## 2021-04-24 MED ORDER — MONTELUKAST SODIUM 10 MG PO TABS: ORAL_TABLET | ORAL | 3 refills | Status: DC

## 2021-04-24 MED ORDER — HYOSCYAMINE SULFATE 0.125 MG SL SUBL: SUBLINGUAL_TABLET | SUBLINGUAL | 3 refills | Status: AC

## 2021-04-26 ENCOUNTER — Encounter: Payer: Self-pay | Admitting: Internal Medicine

## 2021-06-15 ENCOUNTER — Telehealth: Payer: Self-pay | Admitting: Internal Medicine

## 2021-06-15 NOTE — Chronic Care Management (AMB) (Signed)
  Chronic Care Management   Outreach Note  06/15/2021 Name: Marcus Chambers MRN: [id number] DOB: 1941/03/24  Referred by: [name], MD Reason for referral : No chief complaint on file.   An unsuccessful telephone outreach was attempted today. The patient was referred to the pharmacist for assistance with care management and care coordination.   Follow Up Plan:  Tatjana Dellinger Upstream Scheduler

## 2021-06-20 ENCOUNTER — Telehealth: Payer: Self-pay | Admitting: Internal Medicine

## 2021-06-20 NOTE — Chronic Care Management (AMB) (Signed)
  Chronic Care Management   Note  06/20/2021 Name: CARTRELL BENTSEN MRN: [id number] DOB: April 19, 1941  KYSEAN SWEET is a 79 y.o. year old male who is a primary care patient of [name], MD. I reached out to Karl Bales by phone today in response to a referral sent by Mr. Shanan Mcmiller [facility] PCP, [name], MD.   Mr. Cyr was given information about Chronic Care Management services today including:  CCM service includes personalized support from designated clinical staff supervised by his physician, including individualized plan of care and coordination with other care providers 24/7 contact phone numbers for assistance for urgent and routine care needs. Service will only be billed when office clinical staff spend 20 minutes or more in a month to coordinate care. Only one practitioner may furnish and bill the service in a calendar month. The patient may stop CCM services at any time (effective at the end of the month) by phone call to the office staff.   Patient agreed to services and verbal consent obtained.   Follow up plan:   Tatjana Secretary/administrator

## 2021-06-20 NOTE — Progress Notes (Signed)
  Chronic Care Management   Outreach Note  06/20/2021 Name: Marcus Chambers MRN: [id number] DOB: Oct 17, 1940  Referred by: [name], MD Reason for referral : No chief complaint on file.   A second unsuccessful telephone outreach was attempted today. The patient was referred to pharmacist for assistance with care management and care coordination.  Follow Up Plan:   Tatjana Dellinger Upstream Scheduler

## 2021-06-20 NOTE — Progress Notes (Deleted)
3 MONTH FOLLOW UP Assessment:    Essential hypertension Continue bumetanide, try reducing amlodipine to 2.5 mg due to very aggressive control per home log -resume whole tab if persistent elevation above goal  Monitor blood pressure at home; call if consistently over 130/80 Continue DASH diet.   Reminder to go to the ER if any CP, SOB, nausea, dizziness, severe HA, changes vision/speech, left arm numbness and tingling and jaw pain.  Type 2 diabetes mellitus with stage 2 chronic kidney disease, without long-term current use of insulin [facility]) Education: Reviewed 'ABCs' of diabetes management (respective goals in parentheses):  A1C (<7), blood pressure (<130/80), and cholesterol (LDL <70) Eye Exam yearly and Dental Exam every 6 months. Dietary recommendations Physical Activity recommendations Foot exam UTD  CKD stage 2 due to type 2 diabetes mellitus (HCC) Increase fluids, avoid NSAIDS, monitor sugars, will monitor  Vitamin D deficiency At goal at recent check; continue to recommend supplementation for goal of 60-100 Defer vitamin D level  Obesity (BMI 34)  Long discussion about weight loss, diet, and exercise Recommended diet heavy in fruits and veggies and low in animal meats, cheeses, and dairy products, appropriate calorie intake He will work on cutting down on sweets Discussed appropriate weight for height  - next weight goal of 230 lb, long term goal <190lb per patient Follow up at next visit  Medication management CBC, CMP/GFR, magnesium   Mixed hyperlipidemia associated with Type 2 DM Continue medications; currently above goal -will increase to 20 mg daily  LDL goal <70  Continue low cholesterol diet and exercise.  Check lipid panel.   Idiopathic gout, unspecified chronicity, unspecified site Continue allopurinol Diet discussed Check uric acid as needed  Anxiety Successfully tapered off of benzo; reports managing fairly well Stress management techniques discussed,  increase water, good sleep hygiene discussed, increase exercise, and increase veggies.     Over 30 minutes of exam, counseling, chart review, and critical decision making was performed  Future Appointments  Date Time Provider [place]  06/22/2021 10:30 AM Magda Bernheim, NP [facility] None  09/20/2021 10:30 AM [name], MD [facility] None  11/28/2021 11:00 AM Magda Bernheim, NP [facility] None  03/07/2022  3:00 PM [name], MD [facility] None     Subjective:  Marcus Chambers is a 80 y.o. male who presents for  3 month follow up for HTN, hyperlipidemia, T2DM, and vitamin D Def.   he has situational anxiety with driving into town, hx of insomnia, was on xanax but has successfully tapered off this year and reports doing fairly well.   BMI is There is no height or weight on file to calculate BMI., he has been working on diet, plans to join a [employer] eventually, currently walking on treadmill 30 min 3-4 days/week.  Wt Readings from Last 3 Encounters:  03/07/21 259 lb 12.8 oz (117.8 kg)  11/26/20 267 lb (121.1 kg)  07/29/20 259 lb 3.2 oz (117.6 kg)   His blood pressure has been controlled at home (90-low 110s/60s), today their BP is   - reports typical after driving on [employer] [address].  He does workout. He denies chest pain, shortness of breath, dizziness.   He is on cholesterol medication (atorvastatin 10 mg daily) and denies myalgias. His cholesterol is not at goal of LDL <70. The cholesterol last visit was:   Lab Results  Component Value Date   CHOL 172 03/07/2021   HDL 60 03/07/2021   LDLCALC 98 03/07/2021   TRIG 58  03/07/2021   CHOLHDL 2.9 03/07/2021   He has been working on diet and exercise for T2 diabetes controlled on metformin 2000 mg daily, and denies foot ulcerations, increased appetite, nausea, paresthesia of the feet, polydipsia, polyuria, visual disturbances, vomiting and weight loss.  Fasting glucose typically in 110 range Last A1C in the  office was:  Lab Results  Component Value Date   HGBA1C 5.9 (H) 03/07/2021   He has CKD II associated with T2DM monitored at this office, ACI/ARB contraindicated per chart but patient unsure why:  Lab Results  Component Value Date   GFRAA 75 11/26/2020    Patient is on Vitamin D supplement and at goal at recent check:   Lab Results  Component Value Date   VD25OH 88 03/07/2021     Patient is on allopurinol for gout and does not report a recent flare.  Lab Results  Component Value Date   LABURIC 4.0 03/07/2021   He has hx of prostate cancer circa 2010, underwent radiation, was followed annually by [facility], last visit 06/25/2018 but now monitoring at this office  PSAs have been stable, denies urinary sx.  Lab Results  Component Value Date   PSA 0.25 03/07/2021   PSA 0.2 01/13/2020   PSA 0.3 09/02/2018    Medication Review:  Current Outpatient Medications (Endocrine & Metabolic):    metFORMIN (GLUCOPHAGE-XR) 500 MG 24 hr tablet, TAKE 2 TABLETS BY MOUTH TWICE DAILY WITH MEALS FOR DIABETES  Current Outpatient Medications (Cardiovascular):    amLODipine (NORVASC) 5 MG tablet, Take 1 tablet  Daily for BP   atorvastatin (LIPITOR) 20 MG tablet, Take  1 tablet  Daily  for Cholesterol                              /                                                                                                          TAKE 1 TABLET BY MOUTH ONCE DAILY FOR CHOLESTEROL   bumetanide (BUMEX) 2 MG tablet, Take     1 tablet      2x  /day for BP and  Fluid Retention /Ankle Swelling  Current Outpatient Medications (Respiratory):    ipratropium (ATROVENT) 0.06 % nasal spray, Use 1 to 2 sprays each nostril 2 to 3 x /day as needed   mometasone (NASONEX) 50 MCG/ACT nasal spray, Place 1 spray into the nose 2 (two) times daily as needed. 1 spray into each nostril.   montelukast (SINGULAIR) 10 MG tablet, Take  1 tablet  Daily  for Allergies                       /     TAKE 1 TABLET BY MOUTH  ONCE DAILY FOR ALLERGIES  Current Outpatient Medications (Analgesics):    allopurinol (ZYLOPRIM) 300 MG tablet, TAKE 1 TABLET BY MOUTH ONCE DAILY TO  PREVENT  GOUT   aspirin 81 MG tablet, Take 81  mg by mouth daily.   Current Outpatient Medications (Other):    Ascorbic Acid (VITAMIN C) 1000 MG tablet, Take 1,000 mg by mouth daily.   blood glucose meter kit and supplies KIT, CHECK BLOOD SUGAR 1 TIME DAILY-E11.9   Cholecalciferol (VITAMIN D PO), Take 10,000 Units by mouth.    dicyclomine (BENTYL) 20 MG tablet, Take  1 tablet  4 x /day  before Meals & Bedtime  if needed for Nausea, Cramping or Diarrhea.   glucose blood (ONETOUCH ULTRA) test strip, Test blood sugar once daily   hyoscyamine (LEVSIN SL) 0.125 MG SL tablet, Dissolve 1 tablet under tongue every 4 hours if needed for Nausea, Bloating, Cramping or Diarrhea.   Lancets MISC, CHECK BLOOD SUGAR 1 TIME DAILY-E11.9   MAGNESIUM PO, Take 500 mg by mouth 2 (two) times daily.    zinc gluconate 50 MG tablet, Take 50 mg by mouth daily.  Allergies: Allergies  Allergen Reactions   Ace Inhibitors     Current Problems (verified) has History of colonic polyps; Type 2 diabetes mellitus with stage 2 chronic kidney disease, without long-term current use of insulin ([place]); Hyperlipidemia associated with type 2 diabetes mellitus ([place]); Idiopathic gout; Vitamin D deficiency; Essential hypertension; Prostate cancer screening; H/O prostate cancer (2006); Obesity (BMI 30.0-34.9); Hypertensive retinopathy; CKD stage 2 due to type 2 diabetes mellitus ([place]); Situational anxiety ; ACEI/ARB contraindicated; Asymptomatic PVCs; FHx: heart disease; BPH with obstruction/lower urinary tract symptoms; and Hyperparathyroidism ([place]) on their problem list.  Surgical: He  has a past surgical history that includes Appendectomy (1994); Cholecystectomy (1994); Knee surgery (1996); and colovesical fistula repair (01/2011). Family His family history is not on file. Social  history  He reports that he quit smoking about 35 years ago. His smoking use included cigarettes. He has never used smokeless tobacco. He reports that he does not drink alcohol and does not use drugs.   Review of Systems  Constitutional:  Negative for malaise/fatigue and weight loss.  HENT:  Negative for hearing loss and tinnitus.   Eyes:  Negative for blurred vision and double vision.  Respiratory:  Negative for cough, shortness of breath and wheezing.   Cardiovascular:  Negative for chest pain, palpitations, orthopnea, claudication and leg swelling.  Gastrointestinal:  Negative for abdominal pain, blood in stool, constipation, diarrhea, heartburn, melena, nausea and vomiting.  Genitourinary: Negative.   Musculoskeletal:  Negative for joint pain and myalgias.  Skin:  Negative for rash.  Neurological:  Negative for dizziness, tingling, sensory change, weakness and headaches.  Endo/Heme/Allergies:  Negative for polydipsia.  Psychiatric/Behavioral: Negative.    All other systems reviewed and are negative.   Objective:   There were no vitals filed for this visit.  There is no height or weight on file to calculate BMI.  General appearance: alert, no distress, WD/WN, obese male HEENT: normocephalic, sclerae anicteric, TMs pearly, nares patent, no discharge or erythema, pharynx normal Oral cavity: MMM, no lesions Neck: supple, no lymphadenopathy, no thyromegaly, no masses Heart: RRR, normal S1, S2, no murmurs Lungs: CTA bilaterally, no wheezes, rhonchi, or rales Abdomen: +bs, soft, pendulous, non tender, non distended, no masses, no hepatomegaly, no splenomegaly Musculoskeletal: nontender, no swelling, no obvious deformity Extremities: no edema, no cyanosis, no clubbing.  Pulses: 2+ symmetric, upper and lower extremities, normal cap refill Neurological: alert, oriented x 3, CN2-12 intact, strength normal upper extremities and lower extremities, sensation normal throughout, DTRs 2+  throughout, no cerebellar signs, gait slow steady Psychiatric: normal affect, behavior normal, pleasant  Magda Bernheim, NP   06/20/2021

## 2021-06-22 ENCOUNTER — Ambulatory Visit: Payer: PPO | Admitting: Nurse Practitioner

## 2021-06-22 DIAGNOSIS — E1169 Type 2 diabetes mellitus with other specified complication: Secondary | ICD-10-CM

## 2021-06-22 DIAGNOSIS — M1000 Idiopathic gout, unspecified site: Secondary | ICD-10-CM

## 2021-06-22 DIAGNOSIS — Z79899 Other long term (current) drug therapy: Secondary | ICD-10-CM

## 2021-06-22 DIAGNOSIS — E559 Vitamin D deficiency, unspecified: Secondary | ICD-10-CM

## 2021-06-22 DIAGNOSIS — E1122 Type 2 diabetes mellitus with diabetic chronic kidney disease: Secondary | ICD-10-CM

## 2021-06-22 DIAGNOSIS — I10 Essential (primary) hypertension: Secondary | ICD-10-CM

## 2021-06-22 DIAGNOSIS — E669 Obesity, unspecified: Secondary | ICD-10-CM

## 2021-07-05 ENCOUNTER — Ambulatory Visit: Payer: PPO | Admitting: Internal Medicine

## 2021-07-14 NOTE — Progress Notes (Signed)
3 MONTH FOLLOW UP Assessment:    Essential hypertension Continue bumetanide, amlodipine Monitor blood pressure at home; call if consistently over 130/80 Continue DASH diet.   Reminder to go to the ER if any CP, SOB, nausea, dizziness, severe HA, changes vision/speech, left arm numbness and tingling and jaw pain. - CBC  Type 2 diabetes mellitus with stage 2 chronic kidney disease, without long-term current use of insulin ([place]) Education: Reviewed ABCs of diabetes management (respective goals in parentheses):  A1C (<7), blood pressure (<130/80), and cholesterol (LDL <70) Eye Exam yearly and Dental Exam every 6 months. Dietary recommendations Physical Activity recommendations Foot exam UTD New glucometer and testing supplies order sent to pharmacy -A1c - CMP  CKD stage 2 due to type 2 diabetes mellitus (HCC) Increase fluids, avoid NSAIDS, monitor sugars, will monitor  Vitamin D deficiency At goal at recent check; continue to recommend supplementation for goal of 60-100 Defer vitamin D level  Obesity (BMI 34)  Long discussion about weight loss, diet, and exercise Recommended diet heavy in fruits and veggies and low in animal meats, cheeses, and dairy products, appropriate calorie intake He will work on cutting down on sweets He is down 22 pounds in the past 6 months Follow up at next visit  Medication management CBC, CMP/GFR, magnesium   Mixed hyperlipidemia associated with Type 2 DM Continue medications; currently above goal -will increase to 20 mg daily  LDL goal <70  Continue low cholesterol diet and exercise.  Check lipid panel.   Idiopathic gout, unspecified chronicity, unspecified site Continue allopurinol Diet discussed Check uric acid as needed  Anxiety Successfully tapered off of benzo; reports managing fairly well Stress management techniques discussed, increase water, good sleep hygiene discussed, increase exercise, and increase veggies.     Over 30  minutes of exam, counseling, chart review, and critical decision making was performed  Future Appointments  Date Time Provider [place]  08/08/2021  9:00 AM Newton Pigg, [facility] None  09/20/2021 10:30 AM [name], MD [facility] None  11/28/2021 11:00 AM Magda Bernheim, NP [facility] None  03/07/2022  3:00 PM [name], MD [facility] None     Subjective:  Marcus Chambers is a 80 y.o. male who presents for  3 month follow up for HTN, hyperlipidemia, T2DM, and vitamin D Def.   he has situational anxiety with driving into town, hx of insomnia, previously used xanax but has successfully tapered off , reports doing fairly well.   BMI is Body mass index is 31.46 kg/m., he has been working on diet, plans to join a [employer] eventually, currently walking on treadmill 30 min 3-4 days/week. 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 He denies chest pain, shortness of breath, dizziness.   He is on cholesterol medication (atorvastatin 20 mg daily) and denies myalgias. His cholesterol is not at goal of LDL <70. The cholesterol last visit was:   Lab Results  Component Value Date   CHOL 172 03/07/2021   HDL 60 03/07/2021  [place] 98 03/07/2021   TRIG 58 03/07/2021   CHOLHDL 2.9 03/07/2021   He has been working on diet and exercise for T2 diabetes controlled on metformin 2000 mg daily, and denies foot ulcerations, increased appetite, nausea, paresthesia of the feet, polydipsia, polyuria, visual disturbances, vomiting and weight loss.  Has not been  checking blood sugars because he needs a new monitor Last A1C in the office was:  Lab Results  Component Value Date   HGBA1C 5.9 (H) 03/07/2021   He has CKD II associated with T2DM monitored at this office, ACI/ARB contraindicated per chart but patient unsure why:  Lab Results  Component Value Date   GFRAA 75 11/26/2020    Patient is on Vitamin D supplement and at goal at recent check:   Lab Results  Component Value Date   VD25OH 88 03/07/2021     Patient is on allopurinol for gout and does not report a recent flare.  Lab Results  Component Value Date   LABURIC 4.0 03/07/2021   He has hx of prostate cancer circa 2010, underwent radiation, was followed annually by [facility], last visit 06/25/2018 but now monitoring at this office  PSAs have been stable, denies urinary sx.  Lab Results  Component Value Date   PSA 0.25 03/07/2021   PSA 0.2 01/13/2020   PSA 0.3 09/02/2018    Medication Review:  Current Outpatient Medications (Endocrine & Metabolic):    metFORMIN (GLUCOPHAGE-XR) 500 MG 24 hr tablet, TAKE 2 TABLETS BY MOUTH TWICE DAILY WITH MEALS FOR DIABETES  Current Outpatient Medications (Cardiovascular):    amLODipine (NORVASC) 5 MG tablet, Take 1 tablet  Daily for BP   atorvastatin (LIPITOR) 20 MG tablet, Take  1 tablet  Daily  for Cholesterol                              /                                                                                                          TAKE 1 TABLET BY MOUTH ONCE DAILY FOR CHOLESTEROL   bumetanide (BUMEX) 2 MG tablet, Take     1 tablet      2x  /day for BP and  Fluid Retention /Ankle Swelling  Current Outpatient Medications (Respiratory):    ipratropium (ATROVENT) 0.06 % nasal spray, Use 1 to 2 sprays each nostril 2 to 3 x /day as needed   montelukast (SINGULAIR) 10 MG tablet, Take  1 tablet  Daily  for Allergies                       /     TAKE 1 TABLET BY MOUTH ONCE DAILY FOR ALLERGIES   mometasone (NASONEX) 50 MCG/ACT nasal  spray, Place 1 spray into the nose 2 (two) times daily as needed. 1 spray into each nostril.  Current Outpatient Medications (Analgesics):    allopurinol (ZYLOPRIM) 300 MG tablet, TAKE 1 TABLET BY MOUTH ONCE DAILY  TO  PREVENT  GOUT   aspirin 81 MG tablet, Take 81 mg by mouth daily.   Current Outpatient Medications (Other):    Ascorbic Acid (VITAMIN C) 1000 MG tablet, Take 1,000 mg by mouth daily.   Cholecalciferol (VITAMIN D PO), Take 10,000 Units by mouth.    dicyclomine (BENTYL) 20 MG tablet, Take  1 tablet  4 x /day  before Meals & Bedtime  if needed for Nausea, Cramping or Diarrhea.   hyoscyamine (LEVSIN SL) 0.125 MG SL tablet, Dissolve 1 tablet under tongue every 4 hours if needed for Nausea, Bloating, Cramping or Diarrhea.   MAGNESIUM PO, Take 500 mg by mouth 2 (two) times daily.    zinc gluconate 50 MG tablet, Take 50 mg by mouth daily.   blood glucose meter kit and supplies KIT, CHECK BLOOD SUGAR 1 TIME DAILY-E11.9   glucose blood (ONETOUCH ULTRA) test strip, Test blood sugar once daily   Lancets MISC, CHECK BLOOD SUGAR 1 TIME DAILY-E11.9  Allergies: Allergies  Allergen Reactions   Ace Inhibitors     Current Problems (verified) has History of colonic polyps; Type 2 diabetes mellitus with stage 2 chronic kidney disease, without long-term current use of insulin ([place]); Hyperlipidemia associated with type 2 diabetes mellitus ([place]); Idiopathic gout; Vitamin D deficiency; Essential hypertension; Prostate cancer screening; H/O prostate cancer (2006); Obesity (BMI 30.0-34.9); Hypertensive retinopathy; CKD stage 2 due to type 2 diabetes mellitus ([place]); Situational anxiety ; ACEI/ARB contraindicated; Asymptomatic PVCs; FHx: heart disease; BPH with obstruction/lower urinary tract symptoms; and Hyperparathyroidism ([place]) on their problem list.  Surgical: He  has a past surgical history that includes Appendectomy (1994); Cholecystectomy (1994); Knee surgery (1996); and colovesical fistula  repair (01/2011). Family His family history is not on file. Social history  He reports that he quit smoking about 36 years ago. His smoking use included cigarettes. He has never used smokeless tobacco. He reports that he does not drink alcohol and does not use drugs.   Review of Systems  Constitutional:  Negative for malaise/fatigue and weight loss.  HENT:  Negative for hearing loss and tinnitus.   Eyes:  Negative for blurred vision and double vision.  Respiratory:  Negative for cough, shortness of breath and wheezing.   Cardiovascular:  Negative for chest pain, palpitations, orthopnea, claudication and leg swelling.  Gastrointestinal:  Negative for abdominal pain, blood in stool, constipation, diarrhea, heartburn, melena, nausea and vomiting.  Genitourinary: Negative.   Musculoskeletal:  Negative for joint pain and myalgias.  Skin:  Negative for rash.  Neurological:  Negative for dizziness, tingling, sensory change, weakness and headaches.  Endo/Heme/Allergies:  Negative for polydipsia.  Psychiatric/Behavioral: Negative.    All other systems reviewed and are negative.   Objective:   Today's Vitals   07/15/21 0912  BP: (!) 142/74  Pulse: 70  Temp: 97.7 F (36.5 C)  SpO2: 99%  Weight: 245 lb (111.1 kg)   Body mass index is 31.46 kg/m.  General appearance: alert, no distress, WD/WN, obese male HEENT: normocephalic, sclerae anicteric, TMs pearly, nares patent, no discharge or erythema, pharynx normal Oral cavity: MMM, no lesions Neck: supple, no lymphadenopathy, no thyromegaly, no masses Heart: RRR, normal S1, S2, no murmurs Lungs: CTA bilaterally, no wheezes, rhonchi, or rales Abdomen: +bs, soft, pendulous, non tender, non distended, no masses, no hepatomegaly, no splenomegaly Musculoskeletal: nontender, no swelling, no obvious deformity Extremities: no edema, no cyanosis, no clubbing.  Pulses: 2+ symmetric, upper and lower extremities, normal cap refill Neurological:  alert, oriented x 3,  CN2-12 intact, strength normal upper extremities and lower extremities, sensation normal throughout, DTRs 2+ throughout, no cerebellar signs, gait slow steady Psychiatric: normal affect, behavior normal, pleasant    Magda Bernheim, NP   07/15/2021

## 2021-07-15 ENCOUNTER — Other Ambulatory Visit: Payer: Self-pay

## 2021-07-15 ENCOUNTER — Ambulatory Visit (INDEPENDENT_AMBULATORY_CARE_PROVIDER_SITE_OTHER): Payer: PPO | Admitting: Nurse Practitioner

## 2021-07-15 ENCOUNTER — Encounter: Payer: Self-pay | Admitting: Nurse Practitioner

## 2021-07-15 VITALS — BP 142/74 | HR 70 | Temp 97.7°F | Wt 245.0 lb

## 2021-07-15 DIAGNOSIS — E1169 Type 2 diabetes mellitus with other specified complication: Secondary | ICD-10-CM

## 2021-07-15 DIAGNOSIS — Z79899 Other long term (current) drug therapy: Secondary | ICD-10-CM

## 2021-07-15 DIAGNOSIS — F419 Anxiety disorder, unspecified: Secondary | ICD-10-CM | POA: Diagnosis not present

## 2021-07-15 DIAGNOSIS — E785 Hyperlipidemia, unspecified: Secondary | ICD-10-CM

## 2021-07-15 DIAGNOSIS — N182 Chronic kidney disease, stage 2 (mild): Secondary | ICD-10-CM

## 2021-07-15 DIAGNOSIS — E1122 Type 2 diabetes mellitus with diabetic chronic kidney disease: Secondary | ICD-10-CM | POA: Diagnosis not present

## 2021-07-15 DIAGNOSIS — M1000 Idiopathic gout, unspecified site: Secondary | ICD-10-CM

## 2021-07-15 DIAGNOSIS — E559 Vitamin D deficiency, unspecified: Secondary | ICD-10-CM

## 2021-07-15 DIAGNOSIS — E669 Obesity, unspecified: Secondary | ICD-10-CM

## 2021-07-15 DIAGNOSIS — I10 Essential (primary) hypertension: Secondary | ICD-10-CM | POA: Diagnosis not present

## 2021-07-15 MED ORDER — BLOOD GLUCOSE MONITOR KIT: PACK | 0 refills | Status: AC

## 2021-07-15 MED ORDER — ONETOUCH ULTRA VI STRP: ORAL_STRIP | 12 refills | Status: DC

## 2021-07-16 LAB — HEMOGLOBIN A1C
Hgb A1c MFr Bld: 5.6 % of total Hgb (ref <5.7)
Mean Plasma Glucose: 114 mg/dL
eAG (mmol/L): 6.3 mmol/L

## 2021-07-16 LAB — COMPLETE METABOLIC PANEL WITH GFR
AG Ratio: 1.3 (calc) (ref 1.0–2.5)
ALT: 16 U/L (ref 9–46)
AST: 18 U/L (ref 10–35)
Albumin: 4 g/dL (ref 3.6–5.1)
Alkaline phosphatase (APISO): 67 U/L (ref 35–144)
BUN: 14 mg/dL (ref 7–25)
CO2: 25 mmol/L (ref 20–32)
Calcium: 10.6 mg/dL — ABNORMAL HIGH (ref 8.6–10.3)
Chloride: 105 mmol/L (ref 98–110)
Creat: 0.97 mg/dL (ref 0.70–1.22)
Globulin: 3.2 g/dL (calc) (ref 1.9–3.7)
Glucose, Bld: 100 mg/dL — ABNORMAL HIGH (ref 65–99)
Potassium: 4 mmol/L (ref 3.5–5.3)
Sodium: 139 mmol/L (ref 135–146)
Total Bilirubin: 0.4 mg/dL (ref 0.2–1.2)
Total Protein: 7.2 g/dL (ref 6.1–8.1)
eGFR: 79 mL/min/{1.73_m2} (ref >=60)

## 2021-07-16 LAB — CBC WITH DIFFERENTIAL/PLATELET
Absolute Monocytes: 377 cells/uL (ref 200–950)
Basophils Absolute: 61 cells/uL (ref 0–200)
Basophils Relative: 1.2 %
Eosinophils Absolute: 128 cells/uL (ref 15–500)
Eosinophils Relative: 2.5 %
HCT: 38.9 % (ref 38.5–50.0)
Hemoglobin: 12.4 g/dL — ABNORMAL LOW (ref 13.2–17.1)
Lymphs Abs: 923 cells/uL (ref 850–3900)
MCH: 26.9 pg — ABNORMAL LOW (ref 27.0–33.0)
MCHC: 31.9 g/dL — ABNORMAL LOW (ref 32.0–36.0)
MCV: 84.4 fL (ref 80.0–100.0)
MPV: 10.8 fL (ref 7.5–12.5)
Monocytes Relative: 7.4 %
Neutro Abs: 3611 cells/uL (ref 1500–7800)
Neutrophils Relative %: 70.8 %
Platelets: 260 10*3/uL (ref 140–400)
RBC: 4.61 10*6/uL (ref 4.20–5.80)
RDW: 14.8 % (ref 11.0–15.0)
Total Lymphocyte: 18.1 %
WBC: 5.1 10*3/uL (ref 3.8–10.8)

## 2021-07-16 LAB — LIPID PANEL
Cholesterol: 159 mg/dL (ref <200)
HDL: 73 mg/dL (ref >=40)
LDL Cholesterol (Calc): 73 mg/dL (calc)
Non-HDL Cholesterol (Calc): 86 mg/dL (calc) (ref <130)
Total CHOL/HDL Ratio: 2.2 (calc) (ref <5.0)
Triglycerides: 45 mg/dL (ref <150)

## 2021-07-16 NOTE — Progress Notes (Signed)
A1c is normal

## 2021-08-05 ENCOUNTER — Telehealth: Payer: Self-pay

## 2021-08-05 NOTE — Telephone Encounter (Signed)
Called patient to confirm upcoming appointment w/ CPP Seth Bake on 1/23 at 9:00am. Unable to reach patient. [place]. Vanetta Shawl, [facility] 08/05/21

## 2021-08-07 ENCOUNTER — Other Ambulatory Visit: Payer: Self-pay | Admitting: Internal Medicine

## 2021-08-07 ENCOUNTER — Other Ambulatory Visit: Payer: Self-pay | Admitting: Adult Health Nurse Practitioner

## 2021-08-07 DIAGNOSIS — J302 Other seasonal allergic rhinitis: Secondary | ICD-10-CM

## 2021-08-08 ENCOUNTER — Other Ambulatory Visit: Payer: Self-pay

## 2021-08-08 ENCOUNTER — Ambulatory Visit: Payer: PPO | Admitting: Pharmacist

## 2021-08-08 VITALS — BP 118/73 | HR 69

## 2021-08-08 DIAGNOSIS — E669 Obesity, unspecified: Secondary | ICD-10-CM

## 2021-08-08 DIAGNOSIS — F418 Other specified anxiety disorders: Secondary | ICD-10-CM

## 2021-08-08 DIAGNOSIS — Z5309 Procedure and treatment not carried out because of other contraindication: Secondary | ICD-10-CM

## 2021-08-08 DIAGNOSIS — E785 Hyperlipidemia, unspecified: Secondary | ICD-10-CM

## 2021-08-08 DIAGNOSIS — N138 Other obstructive and reflux uropathy: Secondary | ICD-10-CM

## 2021-08-08 DIAGNOSIS — E1122 Type 2 diabetes mellitus with diabetic chronic kidney disease: Secondary | ICD-10-CM

## 2021-08-08 DIAGNOSIS — E1169 Type 2 diabetes mellitus with other specified complication: Secondary | ICD-10-CM

## 2021-08-08 DIAGNOSIS — M1000 Idiopathic gout, unspecified site: Secondary | ICD-10-CM

## 2021-08-08 DIAGNOSIS — I10 Essential (primary) hypertension: Secondary | ICD-10-CM

## 2021-08-11 NOTE — Progress Notes (Signed)
Pharmacist Visit  Marcus Chambers, Marcus Chambers [id number] 81 years, Male  DOB: 09/14/1940  M: [phone] Care Team: Rhys Martini, Korbin Mapps __________________________________________________  Clinical Summary Situation:: Marcus Chambers is an 81 year old male who presents for CCM initial visit via telephone: Patient has no chief complaints at this time. Background:: Pt has a past medical history of HTN, CKD stage II, Idiopathic Gout, BPH, HLD, Situational Anxiety, Diabetes, Vitamin D deficiency, Hyperparathyroidism, Hypertensive retinopathy, and Asymptomatic PVCs. 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 During summertime pt tries to walk at a nearby tack field. Pt checks BP at home 2-3 times a week and does not check BG. He is adherent to all of his medications, but has not needed to take dicyclomine and hyoscyamine due to controlled GI symptoms. Assessment:: Pt at home BP controlled despite increased BP readings in office. Pt tends to get nervous when commuting to [facility] for office visit which results in increased BP readings in office. Blood sugar, blood glucose and cholesterol levels are controlled. Recommendations:: Pt agreeable to enrolling in [facility] for delivery and packaging benefit. Educated patient about Shingrix Vaccination and colonoscopy Encouraged patient to increase physical activity by signing up for silver sneakers program Lawyer:: CCM Services:  This encounter meets complex CCM services and moderate to high medical decision making.  Prior to outreach and patient consent for Chronic Care Management, I referred this patient for services after reviewing the nominated patient list or from a personal encounter with the patient.  I have personally reviewed this encounter including the documentation in this note and have collaborated with the care management provider regarding care management and care coordination activities to  include development and update of the comprehensive care plan I am certifying that I agree with the content of this note and encounter as supervising physician.   Chronic Conditions Patient's Chronic Conditions: Hypertension (HTN), Chronic Kidney Disease (CKD), Gout, Benign Prostatic Hyperplasia (BPH), Hyperlipidemia/Dyslipidemia (HLD), Anxiety, Diabetes (DM),  Vitamin D deficiency, Hyperparathyroidism, Hypotensive retinopathy, Asymptomatic PVCs  Doctor and Hospital Visits Were there PCP Visits in last 6 months?: Yes Visit #1: 03/07/21-Dr.McKeown (PCP)-Pt. presented for a Screening /Preventative Visit &amp; comprehensive evaluation and management of multiple medical co-morbidities. No medication changes noted. Visit #2: 07/15/21-Dr. McKeown-Pt. presented for 3 month f/u. No medication changes noted. Were there Specialist Visits in last 6 months?: No Was there a Hospital Visit in last 30 days?: No Were there other Hospital Visits in last 6 months?: No  Medication Information Are there any Medication discrepancies?: Yes Details:  Hyoscyamine 0.125mg - 04/24/21 (&gt;5 days gap) 90DS by [facility] Montelukast 10mg -04/24/21 (&gt;5 days gap) 90DS by [facility] Atorvastatin 20mg -04/24/21 (&gt;5 days gap) 90DS by [facility] Dicyclomine 20mg -04/24/21 (&gt;5 days gap) 90DS by [facility] Amlodipine 5mg -12/19/20 (&gt;5 days gap) 90DS by [facility] Metformin 500mg -11/21/20 (&gt;5 days gap) 180DS by [facility] Allopurinol 300mg -10/23/20 (&gt;5 days gap) 90DS by [facility] Bumetanide 2mg -07/25/20 (&gt;5 days gap) 90DS by [facility]  Are there any Medication adherence gaps (beyond 5 days past due)?: Yes Details:  Hyoscyamine 0.125mg - 04/24/21 (&gt;5 days gap) 90DS by [facility] Montelukast 10mg -04/24/21 (&gt;5 days gap) 90DS by [facility] Atorvastatin 20mg -04/24/21 (&gt;5 days gap) 90DS by [facility] Dicyclomine 20mg -04/24/21 (&gt;5 days gap) 90DS by [facility] Amlodipine 5mg -12/19/20 (&gt;5 days gap) 90DS by [facility] Metformin  500mg -11/21/20 (&gt;5 days gap) 180DS by [facility] Allopurinol 300mg -10/23/20 (&gt;5 days gap) 90DS by [facility] Bumetanide 2mg -07/25/20 (&gt;5 days gap) 90DS by [facility]  Medication adherence rates for the STAR rating drugs:  Metformin 500mg -11/21/20  Amlodipine 5mg -12/19/20 Atorvastatin 20mg -04/24/21   List Patient's current Care Gaps: Need Colorectal Cancer Screening    Patient scheduled for CCM visit with the clinical pharmacist.  Patient is referred for CCM by their PCP and CPP is under general PCP supervision.: At least 2 of these conditions are expected to last 12 months or longer and patient is at significant risk for acute exacerbations and/or functional decline.  Patient has consented to participation in [facility] program. Visit Type: Phone Call Date of Upcoming Visit: 08/08/2021  Disease Assessments Current BP: 142/74 Current HR: 70 taken on: 07/15/2021 Previous BP: 134/68 Previous HR: 71 taken on: 03/06/2021 Weight: 259 BMI: 33.36 Last GFR: 79 taken on: 07/15/2021 Why did the patient present?: CCM Initial Visit Marital status?: Divorced Retired? 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  He volunteers 4 hours a day in the morning 3 days a week. He also loves going to basketball and football games, plays organ and piano at his church on the weekends. Who does the patient spend their time with and what do they do?: Pt lives alone but has many friends. He usually has 4-5 friends at his house on a daily basis Lifestyle habits such as diet and exercise?: Diet: Usually eats lunch at the hospital and dinner at home. Occasionally for breakfast he will have oatmeal, or leftovers from dinners. Has lunch at 1pm and will eat the hospital cafeteria (veggies, sometimes a pizza when hungry).  Chicken broiled for dinner. Coffee: none Water: 3-4 bottles a day  Exercise: Hospital keeps him active by walking and going to grocery store. In the summer has a track that he walks around Alcohol, tobacco, and illicit drug usage?: Alcohol: no Tobacco: no, quit years ago Illicit drug use: No What is the patient's sleep pattern?: No sleep issues How many hours per night does patient typically sleep?: 10 hours Patient pleased with health care they are receiving?: Yes Family, occupational, and living circumstances relevant to overall health?: Not on file Name and location of Current pharmacy: [place], [place] Current Rx insurance plan: HTA Are meds synced by current pharmacy?: No Are meds delivered by current pharmacy?: No - delivery not available Would patient benefit from direct intervention of clinical lead in dispensing process to optimize clinical outcomes?: Yes Are [facility] services available where patient lives?: Yes Is patient disadvantaged to use [facility]?: No [facility] services reviewed with patient and patient wishes to change pharmacy?: Yes - patient provided verbal consent to transfer prescriptions to [facility].  Patient made aware that CCM team is committed to working with any pharmacy and patient is free to select the pharmacy of their choice that best meets their needs.  Patient also made aware they may discontinue use of [facility] at any time if they feel the service is not meeting their personal needs. Reasons patient wishes to change to [facility]: Delivery of medication Any additional demeanor/mood notes?: Ryett Hamman is an 81 year old male who presents for CCM initial visit via telephone: Patient has no chief complaints at this time.  Hypertension (HTN) Assess this condition today?: Yes Is patient able to obtain BP reading today?: Yes BP today is: 118/73 Heart Rate is: 69 Goal: <130/80  mmHG Hypertension Stage: Normal (  SBP <120 and DBP < 80) Patient has tried and failed: ACE inhibitors - Allergy Is Patient checking BP at home?: Yes Pt. home BP readings are ranging: 110-120s/70-80s How often does patient miss taking their blood pressure medications?: Never Has patient experienced hypotension, dizziness, falls or bradycardia?: No Check present secondary causes (below) for HTN: CKD BP RPM device: Does patient qualify?: Yes, but barriers exist for patient Document barriers: Pt checks BP 2-3 times a week We discussed: Proper Home BP Measurement, Targeting 150 minutes of aerobic activity per week, DASH diet:  following a diet emphasizing fruits and vegetables and low-fat dairy products along with whole grains, fish, poultry, and nuts. Reducing red meats and sugars., Reducing the amount of salt intake to 1500mg /per day. Assessment:: Controlled Drug: Amlodipine 5mg  QD Assessment: Appropriate, Effective, Safe, Accessible Drug: Bumetanide 2mg  BID (Pt takes as needed if BP is high) Assessment: Appropriate, Query Effectiveness HC Follow up: N/A Pharmacist Follow up: Assess BP at visits, assess physical activity  Hyperlipidemia/Dyslipidemia (HLD) Last Lipid panel on: 07/15/2021 TC (Goal<200): 159 LDL: 73 HDL (Goal>40): 73 TG (Goal<150): 45 ASCVD 10-year risk?is:: N/A due to Age > 79 Assess this condition today?: Yes LDL Goal: <100 Has patient tried and failed any HLD Medications?: No Check present secondary causes (below) that can lead to increased cholesterol levels (multi-choice optional): CKD, Diabetes We discussed: Encouraged increasing fiber to a daily intake of 10-25g/day, How a diet high in plant sterols (fruits/vegetables/nuts/whole grains/legumes) may reduce your cholesterol., How to reduce cholesterol through diet/weight management and physical activity. Assessment:: Controlled Drug: Atorvastatin 20mg  QD Assessment: Appropriate, Effective, Safe, Accessible HC Follow  up: N/A Pharmacist Follow up: Continue to monitor Lipid levels  Diabetes (DM) Current A1C: 5.6% taken on: 07/15/2021 Previous A1C: 5.9% taken on: 03/06/2021 Type: 2 The current microalbumin ratio is: 7 tested on: 03/06/2021 Last DM Foot Exam on: 03/05/2021 Results: Normal Last DM Eye Exam on: 03/01/2021 Results: No retinopathy Assess this condition today?: Yes Goal A1C: < 7.0 % Type: 2 Is Patient taking statin medication?: Yes Is patient taking ACEi / ARB?: No Reason patient is not taking ACEi / ARB: Allergy DM RPM device: Does patient qualify?: No Patient checking Blood Sugar at home?: Yes Does patient use a Continuous Glucose Monitoring (CGM) device?: No How often does patient check Blood Sugars?: every few days Has patient experienced any hypoglycemic episodes?: No Diet recall discussed: No We discussed: How to recognize and treat signs of hypoglycemia., Modifying lifestyle, including to participate in moderate physical activity (e.g., walking) at least 150 minutes per week., Low carbohydrate eating plan with an emphasis on whole grains, legumes, nuts, fruits, and vegetables and minimal refined and processed foods., Other Details: Encouraged patient to join silver sneakers program. Informed patient of blood sugar goals. Fasting blood sugar should be between 80-130 mg/dL and random blood sugar should be < 180 mg/dL Assessment:: Controlled Drug: Metformin XR 500mg  2 tabs BID Assessment: Appropriate, Effective, Safe, Accessible HC Follow up: N/A Pharmacist Follow up: Continue to assess A1c, FBG  Anxiety Previous GAD-7 Score: Unknown Assess this condition today?: Yes Completing the GAD-7 Questionnaire today?: No In your opinion, how do you feel your anxiety symptoms have been controlled over the past 3 months?: Stable / stayed the same Are there any particular situations or events that make you very fearful (airplanes, heights, crowds, leaving the house)?: Pt states that he  mainly becomes anxious when traveling in big cities such as [place] due to traffic. It causes his BP to  increase What non-pharmacologic therapies (CBT, talk therapy, meditation) have you tried to help manage your anxiety?: Avoiding big cities when possible Assessment:: Controlled Drug: None Additional Info: Situational Anxiety HC Follow up: N/A Pharmacist Follow up: Assess situational anxiety at each visit  BPH Current PSA: 0.25 taken on: 03/06/2021 Assess this condition today?: Yes Are you experiencing any side effects from your BPH medication?: None Completing BPH AUA Questionnaire today?: No How would you feel if you had to live with your urinary condition the way it is now, no better, no worse, for the rest of your life?: Delighted We discussed: Avoid decongestants and antihistamines Assessment:: Controlled Drug: None HC Follow up: N/A Pharmacist Follow up: Assess PSA, urinary symptoms  Gout Most recent uric acid: 4.0 taken on: 03/06/2021 Assess this condition today?: Yes When was your last Gout attack?: Idiopathic gout What diet changes have you made to reduce the frequency of your gout attacks?: Increase water intake, Eating more fruits / vegetables Have these changes been successful in reducing the frequency of the patient's gout attacks?: Yes We discussed: Limiting consumption of red meat and organ meat (liver, heart, kidneys), Limiting consumption of shellfish, anchovies, sardines and trout, Limiting / avoiding drinks high in sugar like fruit juices and sodas, Maintaining a healthy weight and managing other comorbidities commonly associated with gout, Other Details: Counseld on importance of staying hydrated while taking allopurinol to avoid crystal formation in kidneys Assessment:: Controlled Drug: Allopurinol 300mg  QD Assessment: Appropriate, Effective, Safe, Accessible HC Follow up: N/A Pharmacist Follow up: Uric acid level  Irritable Bowel  Syndrome Other Information: Pt symptoms are currently controlled, pt has increased fiber consumption which has helped patient with stomach upset. Not currently needing to take Dicyclomine and Hyoscyamine Assessment:: Controlled Drug: Dicyclomine 20mg  QID with meals PRN (Pt has not needed to take) Assessment: Appropriate, Effective, Safe, Accessible Drug: Hyoscyamine 0.125mg  SL Q4h PRN cramping Assessment: Appropriate, Effective, Safe, Accessible HC Follow up: N/A Pharmacist Follow up: Assess GI symptoms  Exercise, Diet and Non-Drug Coordination Needs Additional exercise counseling points. We discussed: targeting at least 150 minutes per week of moderate-intensity aerobic exercise., encouraging participation in insurance-covered exercise program through local gym (i.e. Silver Sneakers) Additional diet counseling points. We discussed: key components of the DASH diet, aiming to consume at least 8 cups of water day Discussed Non-Drug Care Coordination Needs: Yes Does Patient have Medication financial barriers?: No  Accountable Health Communities Health-Related Social Needs Screening Tool -  SDOH  ([email or web address]) What is your living situation today? (ref #1): I have a steady place to live Think about the place you live. Do you have problems with any of the following? (ref #2): None of the above Within the past 12 months, you worried that your food would run out before you got money to buy more (ref #3): Never true Within the past 12 months, the food you bought just didn't last and you didn't have money to get more (ref #4): Never true In the past 12 months, has lack of reliable transportation kept you from medical appointments, meetings, work or from getting things needed for daily living? (ref #5): No In the past 12 months, has the electric, gas, oil, or water company threatened to shut off services in your home? (ref #6): No How often  does anyone, including family and friends, physically hurt you? (ref #7): Never (1) How often does anyone, including family and friends, insult or talk down to you? (ref #8): Never (1) How often does anyone, including friends  and family, threaten you with harm? (ref #9): Never (1) How often does anyone, including family and friends, scream or curse at you? (ref #10): Never (1)   Newton Pigg on 08/08/2021 08:33 AM HC Chart/ CP prep: 41 minutes LM-08/03/21 CPP Chart Review: 30 min CPP Office Visit: 40 min CPP Office Visit Documentation: 39 min CPP Coordination of Care: [facility] Care Plan Completion: CPP Care Plan Review: 29 min    Newton Pigg on 08/08/2021 10:43 AM HC F/u: [facility] General Assessment June  CPP F/u: 10/13/21: Ok [place] w/ Mckeown 11/28/21" OV w/ Hinton Dyer 03/07/22: OV w Mckeown 06/06/22 @ 9am: CCM phone follow up (assess BP, BG, silver sneakers program?, Situational anxiety improved)  Care Gaps: Zoster vaccine: Pt [facility] at [facility] Covid-19 vaccine: Pt educated Colonoscopy: Pt educated                 Rachelle Hora. Jeannett Senior, PharmD  Clinical Pharmacist  [email or web address]  [phone]

## 2021-08-15 DIAGNOSIS — E1122 Type 2 diabetes mellitus with diabetic chronic kidney disease: Secondary | ICD-10-CM | POA: Diagnosis not present

## 2021-08-15 DIAGNOSIS — N182 Chronic kidney disease, stage 2 (mild): Secondary | ICD-10-CM | POA: Diagnosis not present

## 2021-08-15 DIAGNOSIS — E1169 Type 2 diabetes mellitus with other specified complication: Secondary | ICD-10-CM | POA: Diagnosis not present

## 2021-08-15 DIAGNOSIS — I10 Essential (primary) hypertension: Secondary | ICD-10-CM | POA: Diagnosis not present

## 2021-08-16 ENCOUNTER — Telehealth: Payer: Self-pay

## 2021-08-16 NOTE — Telephone Encounter (Signed)
Called patient to initiate pharmacy onboarding. Patient did not pick up. [place].  Total time spent: 3 minutes Vanetta Shawl, [facility]

## 2021-08-18 ENCOUNTER — Telehealth: Payer: Self-pay

## 2021-08-18 NOTE — Telephone Encounter (Signed)
Called patient to go over pharmacy onboarding form. Patient did not answer. [place].  Total time spent: 2 minutes Vanetta Shawl, [facility]

## 2021-08-22 ENCOUNTER — Telehealth: Payer: Self-pay

## 2021-08-22 NOTE — Telephone Encounter (Signed)
Patient returned call about pharmacy onboarding, patient politley declined at this time. Patient prefers to continue using [facility].

## 2021-09-20 ENCOUNTER — Ambulatory Visit: Payer: Self-pay | Admitting: Internal Medicine

## 2021-09-20 ENCOUNTER — Encounter: Payer: Self-pay | Admitting: Gastroenterology

## 2021-10-08 ENCOUNTER — Other Ambulatory Visit: Payer: Self-pay | Admitting: Internal Medicine

## 2021-10-13 ENCOUNTER — Ambulatory Visit (INDEPENDENT_AMBULATORY_CARE_PROVIDER_SITE_OTHER): Payer: PPO | Admitting: Internal Medicine

## 2021-10-13 ENCOUNTER — Encounter: Payer: Self-pay | Admitting: Internal Medicine

## 2021-10-13 VITALS — BP 140/80 | HR 70 | Temp 97.9°F | Resp 17 | Ht 74.0 in | Wt 244.6 lb

## 2021-10-13 DIAGNOSIS — I10 Essential (primary) hypertension: Secondary | ICD-10-CM | POA: Diagnosis not present

## 2021-10-13 DIAGNOSIS — N182 Chronic kidney disease, stage 2 (mild): Secondary | ICD-10-CM

## 2021-10-13 DIAGNOSIS — E1122 Type 2 diabetes mellitus with diabetic chronic kidney disease: Secondary | ICD-10-CM | POA: Diagnosis not present

## 2021-10-13 DIAGNOSIS — E1169 Type 2 diabetes mellitus with other specified complication: Secondary | ICD-10-CM | POA: Diagnosis not present

## 2021-10-13 DIAGNOSIS — E559 Vitamin D deficiency, unspecified: Secondary | ICD-10-CM | POA: Diagnosis not present

## 2021-10-13 DIAGNOSIS — Z79899 Other long term (current) drug therapy: Secondary | ICD-10-CM | POA: Diagnosis not present

## 2021-10-13 DIAGNOSIS — E785 Hyperlipidemia, unspecified: Secondary | ICD-10-CM

## 2021-10-13 NOTE — Patient Instructions (Signed)

## 2021-10-13 NOTE — Progress Notes (Signed)
? ?Future Appointments  ?Date Time Provider Department  ?10/13/2021 10:30 AM [name], MD [facility]  ?11/28/2021          Wellness 11:00 AM Magda Bernheim, NP [facility]  ?03/07/2022          CPE  3:00 PM [name], MD [facility]  ? ? ?History of Present Illness: ? ? ?    This very nice 81 y.o.  DBM presents for 6 month follow up with HTN, HLD, T2_NIDDM and Vitamin D Deficiency. Patient has Gout controlled on his allopurinol.  ? ? ?    Patient is treated for HTN  ( 1989 ) & BP has been controlled at home. Today?s BP is at goal - 140/80.  In 2008, patient had a false (+) Cardiolite with a negative  hearth cath.  Patient has had no complaints of any cardiac type chest pain, palpitations, dyspnea / orthopnea / PND, dizziness, claudication, or dependent edema. ? ? ?    Hyperlipidemia is controlled with diet & Atorvastatin. Patient denies myalgias or other med SE?s. Last Lipids were at goal : ? ?Lab Results  ?Component Value Date  ? CHOL 159 07/15/2021  ? HDL 73 07/15/2021  ? [place] 73 07/15/2021  ? TRIG 45 07/15/2021  ? CHOLHDL 2.2 07/15/2021  ? ? ? ?Also, the patient has history of Moderate Obesity (BMI 34+) and T2_NIDDM (2005) with CKD2  (GFR 79 )  and has had no symptoms of reactive hypoglycemia, diabetic polys, paresthesias or visual blurring.  Last A1c was normal & at goal : ? ?Lab Results  ?Component Value Date  ? HGBA1C 5.6 07/15/2021  ? ?    ? ?                                                  Further, the patient also has history of Vitamin D Deficiency ("29" /2008) and supplements vitamin D without any suspected side-effects. Last vitamin D was at goal : ? ?Lab Results  ?Component Value Date  ? VD25OH 88 03/07/2021  ? ? ? ?Current Outpatient Medications on File Prior to Visit  ?Medication Sig  ? allopurinol  300 MG tablet Take  1 tablet  Daily                   ? amLODipine 5 MG tablet Take 1 tablet  Daily   ? VITAMIN C 1000 MG  Take daily.  ? aspirin 81 MG tablet Take daily.  ? atorvastatin 20 MG  tablet Take 1 tablet  Daily                            ? bumetanide 2 MG tablet Take 1 tablet 2  x /day   ? VITAMIN D 10,000 Units Take daily.  ? ipratropium  0.06 % nasal spray Use 1 to 2 sprays each nostril 2 to 3 x /day as needed  ? MAGNESIUM 500 mg  Take daily.  ? metFORMIN-XR 500 MG  Take  2 tablets  2 x /day  with Meals    ? mometasone  nasal spray Place 1 spray into the nose 2  times daily   ? montelukast 10 MG tablet Take  1 tablet  Daily    ? zinc 50 MG tablet Take 5\daily.  ? ? ? ?  Allergies  ?Allergen Reactions  ? Ace Inhibitors   ? ? ? ?PMHx:   ?Past Medical History:  ?Diagnosis Date  ? Adenomatous colon polyp   ? 2010  ? Gout   ? Hyperlipidemia   ? Hypertension   ? Prostate cancer ([place]) 2010  ? treated by external beam radiation  ? Type II diabetes mellitus    ? Vitamin D deficiency   ? ? ? ?Immunization History  ?Administered Date(s) Administered  ? DT  03/21/2012  ? Influenza, High Dose  03/27/2019  ? Influenza 04/16/2014, 04/16/2015, 04/16/2018, 04/18/2021  ? Moderna Sars-Covid-2 Vacc 08/07/2019, 09/03/2019, 05/22/2020, 10/27/2020  ? Pneumococcal -13 05/19/2014  ? Pneumococcal -23 07/24/2007  ? ? ? ?Past Surgical History:  ?Procedure Laterality Date  ? APPENDECTOMY  1994  ? and GALLBLADDER  ? CHOLECYSTECTOMY  1994  ? colovesical fistula repair  01/2011  ? KNEE SURGERY  1996  ? ? ?FHx:    Reviewed / unchanged ? ?SHx:    Reviewed / unchanged  ? ?Systems Review: ? ?Constitutional: Denies fever, chills, wt changes, headaches, insomnia, fatigue, night sweats, change in appetite. ?Eyes: Denies redness, blurred vision, diplopia, discharge, itchy, watery eyes.  ?ENT: Denies discharge, congestion, post nasal drip, epistaxis, sore throat, earache, hearing loss, dental pain, tinnitus, vertigo, sinus pain, snoring.  ?CV: Denies chest pain, palpitations, irregular heartbeat, syncope, dyspnea, diaphoresis, orthopnea, PND, claudication or edema. ?Respiratory: denies cough, dyspnea, DOE, pleurisy, hoarseness,  laryngitis, wheezing.  ?Gastrointestinal: Denies dysphagia, odynophagia, heartburn, reflux, water brash, abdominal pain or cramps, nausea, vomiting, bloating, diarrhea, constipation, hematemesis, melena, hematochezia  or hemorrhoids. ?Genitourinary: Denies dysuria, frequency, urgency, nocturia, hesitancy, discharge, hematuria or flank pain. ?Musculoskeletal: Denies arthralgias, myalgias, stiffness, jt. swelling, pain, limping or strain/sprain.  ?Skin: Denies pruritus, rash, hives, warts, acne, eczema or change in skin lesion(s). ?Neuro: No weakness, tremor, incoordination, spasms, paresthesia or pain. ?Psychiatric: Denies confusion, memory loss or sensory loss. ?Endo: Denies change in weight, skin or hair change.  ?Heme/Lymph: No excessive bleeding, bruising or enlarged lymph nodes. ? ?Physical Exam ? ?BP 140/80   Pulse 70   Temp 97.9 ?F (36.6 ?C)   Resp 17   Ht '6\' 2"'$  (1.88 m)   Wt 244 lb 9.6 oz (110.9 kg)   SpO2 97%   BMI 31.40 kg/m?  ? ?Appears  over nourished and in no distress. ? ?Eyes: PERRLA, EOMs, conjunctiva no swelling or erythema. ?Sinuses: No frontal/maxillary tenderness ?ENT/Mouth: EAC's clear, TM's nl w/o erythema, bulging. Nares clear w/o erythema, swelling, exudates. Oropharynx clear without erythema or exudates. Oral hygiene is good. Tongue normal, non obstructing. Hearing intact.  ?Neck: Supple. Thyroid not palpable. Car 2+/2+ without bruits, nodes or JVD. ?Chest: Respirations nl with BS clear & equal w/o rales, rhonchi, wheezing or stridor.  ?Cor: Heart sounds normal w/ regular rate and rhythm without sig. murmurs, gallops, clicks or rubs. Peripheral pulses normal and equal  without edema.  ?Abdomen: Soft & bowel sounds normal. Non-tender w/o guarding, rebound, hernias, masses or organomegaly.  ?Lymphatics: Unremarkable.  ?Musculoskeletal: Full ROM all peripheral extremities, joint stability, 5/5 strength and normal gait.  ?Skin: Warm, dry without exposed rashes, lesions or ecchymosis  apparent.  ?Neuro: Cranial nerves intact, reflexes equal bilaterally. Sensory-motor testing grossly intact. Tendon reflexes grossly intact.  ?Pysch: Alert & oriented x 3.  Insight and judgement nl & appropriate. No ideations. ? ?Assessment and Plan: ? ?- Continue medication, monitor blood pressure at home.  ?- Continue DASH diet.  Reminder to go to the ER if  any CP,  ?SOB, nausea, dizziness, severe HA, changes vision/speech. ? ?1. Essential hypertension ? ?- CBC with Differential/Platelet ?- COMPLETE METABOLIC PANEL WITH GFR ?- Magnesium ?- TSH ? ?2. Hyperlipidemia associated with type 2 diabetes mellitus ([place]) ? ?- Continue diet/meds, exercise,& lifestyle modifications.  ?- Continue monitor periodic cholesterol/liver & renal functions  ? ?- Lipid panel ?- TSH ? ?3. Type 2 diabetes mellitus with stage 2 chronic kidney  ?disease, without long-term current use of insulin ([place]) ? ?- Continue diet, exercise  ?- Lifestyle modifications.  ?- Monitor appropriate labs ?  ?- Hemoglobin A1c ?- Insulin, random ? ?4. Vitamin D deficiency ? ?- Continue supplementation ?   ?- VITAMIN D 25 Hydroxy  ? ?5. Medication management ? ?- CBC with Differential/Platelet ?- COMPLETE METABOLIC PANEL WITH GFR ?- Magnesium ?- Lipid panel ?- TSH ?- Hemoglobin A1c ?- Insulin, random ?- VITAMIN D 25 Hydroxy ? ? ? ?      Discussed  regular exercise, BP monitoring, weight control to achieve/maintain BMI less than 25 and discussed med and SE's. Recommended labs to assess and monitor clinical status with further disposition pending results of labs.  I discussed the assessment and treatment plan with the patient. The patient was provided an opportunity to ask questions and all were answered. The patient agreed with the plan and demonstrated an understanding of the instructions.  I provided over 30 minutes of exam, counseling, chart review and  complex critical decision making. ? ?      The patient was advised to call back or seek an in-person  evaluation if the symptoms worsen or if the condition fails to improve as anticipated. ? ? ?Kirtland Bouchard, MD ? ?

## 2021-10-14 LAB — CBC WITH DIFFERENTIAL/PLATELET
Absolute Monocytes: 450 cells/uL (ref 200–950)
Basophils Absolute: 60 cells/uL (ref 0–200)
Basophils Relative: 1 %
Eosinophils Absolute: 108 cells/uL (ref 15–500)
Eosinophils Relative: 1.8 %
HCT: 40 % (ref 38.5–50.0)
Hemoglobin: 12.7 g/dL — ABNORMAL LOW (ref 13.2–17.1)
Lymphs Abs: 1344 cells/uL (ref 850–3900)
MCH: 26.5 pg — ABNORMAL LOW (ref 27.0–33.0)
MCHC: 31.8 g/dL — ABNORMAL LOW (ref 32.0–36.0)
MCV: 83.3 fL (ref 80.0–100.0)
MPV: 11.1 fL (ref 7.5–12.5)
Monocytes Relative: 7.5 %
Neutro Abs: 4038 cells/uL (ref 1500–7800)
Neutrophils Relative %: 67.3 %
Platelets: 245 10*3/uL (ref 140–400)
RBC: 4.8 10*6/uL (ref 4.20–5.80)
RDW: 15.1 % — ABNORMAL HIGH (ref 11.0–15.0)
Total Lymphocyte: 22.4 %
WBC: 6 10*3/uL (ref 3.8–10.8)

## 2021-10-14 LAB — COMPLETE METABOLIC PANEL WITH GFR
AG Ratio: 1.2 (calc) (ref 1.0–2.5)
ALT: 16 U/L (ref 9–46)
AST: 22 U/L (ref 10–35)
Albumin: 4.2 g/dL (ref 3.6–5.1)
Alkaline phosphatase (APISO): 71 U/L (ref 35–144)
BUN: 16 mg/dL (ref 7–25)
CO2: 25 mmol/L (ref 20–32)
Calcium: 10.6 mg/dL — ABNORMAL HIGH (ref 8.6–10.3)
Chloride: 102 mmol/L (ref 98–110)
Creat: 1.15 mg/dL (ref 0.70–1.22)
Globulin: 3.4 g/dL (calc) (ref 1.9–3.7)
Glucose, Bld: 86 mg/dL (ref 65–99)
Potassium: 4.5 mmol/L (ref 3.5–5.3)
Sodium: 137 mmol/L (ref 135–146)
Total Bilirubin: 0.8 mg/dL (ref 0.2–1.2)
Total Protein: 7.6 g/dL (ref 6.1–8.1)
eGFR: 64 mL/min/{1.73_m2} (ref >=60)

## 2021-10-14 LAB — LIPID PANEL
Cholesterol: 153 mg/dL (ref <200)
HDL: 78 mg/dL (ref >=40)
LDL Cholesterol (Calc): 62 mg/dL (calc)
Non-HDL Cholesterol (Calc): 75 mg/dL (calc) (ref <130)
Total CHOL/HDL Ratio: 2 (calc) (ref <5.0)
Triglycerides: 46 mg/dL (ref <150)

## 2021-10-14 LAB — HEMOGLOBIN A1C
Hgb A1c MFr Bld: 6 % of total Hgb — ABNORMAL HIGH (ref <5.7)
Mean Plasma Glucose: 126 mg/dL
eAG (mmol/L): 7 mmol/L

## 2021-10-14 LAB — TSH: TSH: 1.06 mIU/L (ref 0.40–4.50)

## 2021-10-14 LAB — MAGNESIUM: Magnesium: 1.8 mg/dL (ref 1.5–2.5)

## 2021-10-14 LAB — INSULIN, RANDOM: Insulin: 7.5 u[IU]/mL

## 2021-10-14 LAB — VITAMIN D 25 HYDROXY (VIT D DEFICIENCY, FRACTURES): Vit D, 25-Hydroxy: 87 ng/mL (ref 30–100)

## 2021-10-15 ENCOUNTER — Other Ambulatory Visit: Payer: Self-pay | Admitting: Internal Medicine

## 2021-10-15 ENCOUNTER — Encounter: Payer: Self-pay | Admitting: Internal Medicine

## 2021-10-15 DIAGNOSIS — Z1212 Encounter for screening for malignant neoplasm of rectum: Secondary | ICD-10-CM

## 2021-10-15 DIAGNOSIS — Z86010 Personal history of colonic polyps: Secondary | ICD-10-CM

## 2021-10-15 NOTE — Progress Notes (Signed)
<><><><><><><><><><><><><><><><><><><><><><><><><><><><><><><><><> ?<><><><><><><><><><><><><><><><><><><><><><><><><><><><><><><><><> ? ?-    A1c = 6.0% - too high almost back to Normal ( less than 5.7%),  ? ?So keep working on diet &  weight loss  ? ? ?- Avoid Sweets, Candy & White Stuff  ? ?- White Rice, White Potatoes, White Flour  - Breads &  Pasta ?<><><><><><><><><><><><><><><><><><><><><><><><><><><><><><><><><> ?<><><><><><><><><><><><><><><><><><><><><><><><><><><><><><><><><> ? ?-   Magnesium  -  1.8    -  very  low- goal is betw 2.0 - 2.5,  ? ?- So..............Marland Kitchen  Recommend that you take  ?Magnesium 500 mg tablet daily  ? ?- also important to eat lots of  leafy green vegetables  ? ?- spinach - Kale - collards - greens - okra - asparagus  ?- broccoli - quinoa - squash - almonds  ? ?- black, red, white beans ? ?-  peas - green beans ?<><><><><><><><><><><><><><><><><><><><><><><><><><><><><><><><><> ?<><><><><><><><><><><><><><><><><><><><><><><><><><><><><><><><><> ? ?-  Total Chol = 159   & LDL Chol = n62  -    Both    Excellent  ? ?- Very low risk for Heart Attack  / Stroke ?<><><><><><><><><><><><><><><><><><><><><><><><><><><><><><><><><> ?<><><><><><><><><><><><><><><><><><><><><><><><><><><><><><><><><> ? ?-  Vitamin D = 87 -    Excellent   -  Please keep dose same   ?<><><><><><><><><><><><><><><><><><><><><><><><><><><><><><><><><> ?<><><><><><><><><><><><><><><><><><><><><><><><><><><><><><><><><> ? ?-  All Else - CBC - Kidneys - Electrolytes - Liver - Magnesium & Thyroid   ? ?- all  Normal / OK ?<><><><><><><><><><><><><><><><><><><><><><><><><><><><><><><><><> ?<><><><><><><><><><><><><><><><><><><><><><><><><><><><><><><><><> ? ? ? ? ? ? ? ? ? ? ? ? ? ? ? ? ? ? ? ? ?

## 2021-11-22 ENCOUNTER — Telehealth: Payer: Self-pay

## 2021-11-22 NOTE — Telephone Encounter (Signed)
LM-11/22/21-Calling pt. At [phone] to complete CCM to [facility] transfer. Unable to reach patient. [place]. ? ?Total time spent: 2 min. ?

## 2021-11-22 NOTE — Telephone Encounter (Signed)
LM-11/22/21-Pt. Returned call and completed CCM to [facility] transfer. Pt. Spoke to [facility] and confirmed. ? ? ?

## 2021-11-25 ENCOUNTER — Telehealth: Payer: Self-pay

## 2021-11-25 NOTE — Telephone Encounter (Signed)
LM/512/23-Returning pts. Call. Per scheduler, pt. Has called and LVM X4 to return call. Spoke w/ pt. And he informed me that he was confused about canceled visit. Pt. Stated he talked to someone but doesn't remember their name. Informed pt. To keep appointments w/ Dr. Melford Aase as scheduled and let him know his visit w/ CP is canceled and to call if he has any new health concerns. Pt. Verbalized understanding and agreed. ? ?Total time spent: 8 min. ?

## 2021-11-28 ENCOUNTER — Encounter: Payer: Self-pay | Admitting: Nurse Practitioner

## 2021-11-28 ENCOUNTER — Ambulatory Visit (INDEPENDENT_AMBULATORY_CARE_PROVIDER_SITE_OTHER): Payer: PPO | Admitting: Nurse Practitioner

## 2021-11-28 VITALS — BP 122/82 | HR 85 | Temp 97.7°F | Wt 239.0 lb

## 2021-11-28 DIAGNOSIS — Z8546 Personal history of malignant neoplasm of prostate: Secondary | ICD-10-CM

## 2021-11-28 DIAGNOSIS — M1000 Idiopathic gout, unspecified site: Secondary | ICD-10-CM

## 2021-11-28 DIAGNOSIS — E213 Hyperparathyroidism, unspecified: Secondary | ICD-10-CM

## 2021-11-28 DIAGNOSIS — I10 Essential (primary) hypertension: Secondary | ICD-10-CM

## 2021-11-28 DIAGNOSIS — Z0000 Encounter for general adult medical examination without abnormal findings: Secondary | ICD-10-CM | POA: Diagnosis not present

## 2021-11-28 DIAGNOSIS — Z86010 Personal history of colonic polyps: Secondary | ICD-10-CM

## 2021-11-28 DIAGNOSIS — E559 Vitamin D deficiency, unspecified: Secondary | ICD-10-CM

## 2021-11-28 DIAGNOSIS — I493 Ventricular premature depolarization: Secondary | ICD-10-CM

## 2021-11-28 DIAGNOSIS — Z79899 Other long term (current) drug therapy: Secondary | ICD-10-CM

## 2021-11-28 DIAGNOSIS — E669 Obesity, unspecified: Secondary | ICD-10-CM

## 2021-11-28 DIAGNOSIS — E1122 Type 2 diabetes mellitus with diabetic chronic kidney disease: Secondary | ICD-10-CM

## 2021-11-28 DIAGNOSIS — E1169 Type 2 diabetes mellitus with other specified complication: Secondary | ICD-10-CM

## 2021-11-28 DIAGNOSIS — N138 Other obstructive and reflux uropathy: Secondary | ICD-10-CM

## 2021-11-28 NOTE — Progress Notes (Signed)
MEDICARE ANNUAL WELLNESS VISIT AND FOLLOW UP ?Assessment:  ? ?1. Encounter for Medicare annual wellness exam ?Due Annually  ? ?2. Essential hypertension ?Well controlled. ?Continue medications  ?Monitor blood pressure at home; call if consistently over 130/80 ?Continue to implement lifestyle modifications by monitoring dietary intake of nutritionally dense foods, exercise, weight loss.    ?Reminder to go to the ER if any CP, SOB, nausea, dizziness, severe HA, changes vision/speech, left arm numbness and tingling and jaw pain. ? ? ?3. Asymptomatic PVCs ?No acute or new events. ?Continue to monitor.  ? ?4. Type 2 diabetes mellitus with stage 2 chronic kidney disease, without long-term current use of insulin ([place]) ?Education: Reviewed ?ABCs? of diabetes management  ?A1C (<7):  Goal Met. ?Blood pressure (<130/80):  Goal Met ?Cholesterol (LDL <70):  Goal Met ?Continue Eye Exam yearly  ?Continue Dental Exam Q6 mo ?Discussed dietary recommendations ?Discussed Physical Activity recommendations ?Foot exam UTD ? ? ?5. CKD stage 2 due to type 2 diabetes mellitus ([place]) ?Stay well hydrated.  ?Avoid NSAIDS ?Monitor sugars ?Continue to monitor ? ? ?6. Hyperlipidemia associated with type 2 diabetes mellitus ([place]) ?Continue medications; currently at goal ?Continue low cholesterol diet and exercise.  ?Check lipid panel.  ? ?7. Hyperparathyroidism ([place]) ?Stable. ?Continue to monitor.  ? ?8. BPH with obstruction/lower urinary tract symptoms ?No new changes. ?Continue to monitor. ? ? ?9. History of colonic polyps ?Last Colonoscopy 2016, Due 2023-Dr. Deatra Ina.  Has received notification/letter to schedule. ?Patient to reach out to office if wanting to schedule updated Colonoscopy. ?Evidence indicates that the net benefit of screening all persons in this age group is small. In determining whether this service is appropriate in individual cases, patients and clinicians should consider the patient's overall health, prior screening history,  and  preferences. ? ?Continue to monitor ? ?10. Idiopathic gout, unspecified chronicity, unspecified site ?Continue medication. ?Last Uric Acid Level at goal. ?No new events ?Diet discussed; Low Purine ?Check uric acid  PRN ? ? ?11. Vitamin D deficiency ?Currently at goal. ? ?12. H/O prostate cancer (2006) ?No new changes. ?Continue to monitor. ? ?13. Obesity (BMI 30.0-34.9) ?Has lost 5 lb in 2 months. ?Continue exercise, walking. ?Recommended diet heavy in fruits and veggies and low in animal meats, cheeses, and dairy products, appropriate calorie intake ?Next weight goal of 250 lb, long term goal <190lb per patient ?Follow up at next visit ? ? ?14. Medication management ?All medications reviewed in full. ?All questions and concerns addressed. ? ? ?Over 30 minutes of exam, counseling, chart review, and critical decision making was performed ? ?Future Appointments  ?Date Time Provider [place]  ?03/07/2022  3:00 PM [name], MD [facility] None  ?11/29/2022 11:00 AM Darrol Jump, NP [facility] None  ? ? ? ?Plan:  ? ?During the course of the visit the patient was educated and counseled about appropriate screening and preventive services including:  ? ?Pneumococcal vaccine  ?Influenza vaccine ?Prevnar 13 ?Td vaccine ?Screening electrocardiogram ?Colorectal cancer screening ?Diabetes screening ?Glaucoma screening ?Nutrition counseling  ? ? ?Subjective:  ?Marcus Chambers is a 81 y.o. male who presents for Medicare Annual Wellness Visit and 3 month follow up for HTN, hyperlipidemia, T2DM, and vitamin D Def.  ? ?He is not married, divorced.  He has no children.  Works as a Psychologist, occupational in [employer].  Enjoys his job.  Stays active in church community. ? ?He has hx of prostate cancer circa 2010, underwent radiation, was followed annually by [facility] until 2019 and  now monitored at this office. PSAs have been stable, denies urinary sx.  ? ?he has a diagnosis of anxiety/insomnia formerly on alprazolam,  was able to taper off, hasn't been filled since 07/2019.  ? ?BMI is Body mass index is 30.69 kg/m?., he has been working on diet, walks 3 days a week 20 min, trying to increase since weather is warmer.  Has lost 5 lb in 2 mo ?Wt Readings from Last 3 Encounters:  ?11/28/21 239 lb (108.4 kg)  ?10/13/21 244 lb 9.6 oz (110.9 kg)  ?07/15/21 245 lb (111.1 kg)  ? ?His blood pressure has been controlled at home (130/75), today their BP is BP: 122/82 ?He does workout. He denies chest pain, shortness of breath, dizziness.  ? ?He is on cholesterol medication (atorvastatin 20 mg daily) and denies myalgias. His cholesterol is at goal. The cholesterol last visit was:   ?Lab Results  ?Component Value Date  ? CHOL 153 10/13/2021  ? HDL 78 10/13/2021  ? [place] 62 10/13/2021  ? TRIG 46 10/13/2021  ? CHOLHDL 2.0 10/13/2021  ? ?He has been working on diet and exercise for T2 diabetes controlled on metformin 2000 mg daily, and denies foot ulcerations, increased appetite, nausea, paresthesia of the feet, polydipsia, polyuria, visual disturbances, vomiting and weight loss. He checks sugars 2-3 days a week. ?Last A1C in the office was:  ?Lab Results  ?Component Value Date  ? HGBA1C 6.0 (H) 10/13/2021  ? ?He has CKD II associated with T2DM monitored at this office:  ?Lab Results  ?Component Value Date  ? GFRAA 75 11/26/2020  ? ? Patient is on Vitamin D supplement and at goal at recent check:   ?Lab Results  ?Component Value Date  ? VD25OH 87 10/13/2021  ?   ?Patient is on allopurinol for gout and does not report a recent flare.  ?Lab Results  ?Component Value Date  ? LABURIC 4.0 03/07/2021  ? ? ? ?Medication Review: ? ?Current Outpatient Medications (Endocrine & Metabolic):  ?  metFORMIN (GLUCOPHAGE-XR) 500 MG 24 hr tablet, Take  2 tablets  2 x /day  with Meals  For Diabetes                                                 /                             TAKE                   BY                 MOUTH ? ?Current Outpatient Medications  (Cardiovascular):  ?  amLODipine (NORVASC) 5 MG tablet, Take 1 tablet  Daily for BP ?  atorvastatin (LIPITOR) 20 MG tablet, Take  1 tablet  Daily  for Cholesterol                              /  TAKE 1 TABLET BY MOUTH ONCE DAILY FOR CHOLESTEROL ?  bumetanide (BUMEX) 2 MG tablet, Take     1 tablet      2x  /day for BP and  Fluid Retention /Ankle Swelling ? ?Current Outpatient Medications (Respiratory):  ?  ipratropium (ATROVENT) 0.06 % nasal spray, Use 1 to 2 sprays each nostril 2 to 3 x /day as needed ?  montelukast (SINGULAIR) 10 MG tablet, Take  1 tablet  Daily  for Allergies   V                                                                   /                  TAKE BY MOUTH     ONCE DAILY ?  mometasone (NASONEX) 50 MCG/ACT nasal spray, Place 1 spray into the nose 2 (two) times daily as needed. 1 spray into each nostril. ? ?Current Outpatient Medications (Analgesics):  ?  allopurinol (ZYLOPRIM) 300 MG tablet, Take  1 tablet  Daily  to Prevent Gout                                                                        /                          TAKE  BY MOUTH                    ONCE DAILY ?  aspirin 81 MG tablet, Take 81 mg by mouth daily. ? ? ?Current Outpatient Medications (Other):  ?  Ascorbic Acid (VITAMIN C) 1000 MG tablet, Take 1,000 mg by mouth daily. ?  blood glucose meter kit and supplies KIT, CHECK BLOOD SUGAR 1 TIME DAILY-E11.9 ?  Cholecalciferol (VITAMIN D PO), Take 10,000 Units by mouth daily. ?  glucose blood (ONETOUCH ULTRA) test strip, Test blood sugar once daily ?  hyoscyamine (LEVSIN SL) 0.125 MG SL tablet, Dissolve 1 tablet under tongue every 4 hours if needed for Nausea, Bloating, Cramping or Diarrhea. ?  Lancets MISC, CHECK BLOOD SUGAR 1 TIME DAILY-E11.9 ?  MAGNESIUM PO, Take 500 mg by mouth daily. ?  zinc gluconate 50 MG tablet, Take 50 mg by mouth daily. ?  dicyclomine (BENTYL) 20 MG  tablet, Take  1 tablet  4 x /day  before Meals & Bedtime  if needed for Nausea, Cramping or Diarrhea. (Patient not taking: Reported on 08/08/2021) ? ?Allergies: ?Allergies  ?Allergen Reactions  ? Ace Inhibitors   ?

## 2021-11-28 NOTE — Patient Instructions (Signed)
Budget-Friendly Healthy Eating There are many ways to save money at the grocery store and continue to eat healthy. You can be successful if you: Plan meals according to your budget. Make a grocery list and only purchase food according to your grocery list. Prepare food yourself at home. What are tips for following this plan? Reading food labels Compare food labels between brand name foods and the store brand. Often the nutritional value is the same, but the store brand is lower cost. Look for products that do not have added sugar, fat, or salt (sodium). These often cost the same but are healthier for you. Products may be labeled as: Sugar-free. Nonfat. Low-fat. Sodium-free. Low-sodium. Look for lean ground beef labeled as at least 92% lean and 8% fat. Shopping  Buy only the items on your grocery list and go only to the areas of the store that have the items on your list. Use coupons only for foods and brands you normally buy. Avoid buying items you wouldn't normally buy simply because they are on sale. Check online and in newspapers for weekly deals. Buy healthy items from the bulk bins when available, such as herbs, spices, flour, pasta, nuts, and dried fruit. Buy fruits and vegetables that are in season. Prices are usually lower on in-season produce. Look at the unit price on the price tag. Use it to compare different brands and sizes to find out which item is the best deal. Choose healthy items that are often low-cost, such as carrots, potatoes, apples, bananas, and oranges. Dried or canned beans are a low-cost protein source. Buy in bulk and freeze extra food. Items you can buy in bulk include meats, fish, poultry, frozen fruits, and frozen vegetables. Avoid buying "ready-to-eat" foods, such as pre-cut fruits and vegetables and pre-made salads. If possible, shop around to discover where you can find the best prices. Consider other retailers such as dollar stores, larger wholesale  stores, local fruit and vegetable stands, and farmers markets. Do not shop when you are hungry. If you shop while hungry, it may be hard to stick to your list and budget. Resist impulse buying. Use your grocery list as your official plan for the week. Buy a variety of vegetables and fruits by purchasing fresh, frozen, and canned items. Look at the top and bottom shelves for deals. Foods at eye level (eye level of an adult or child) are usually more expensive. Be efficient with your time when shopping. The more time you spend at the store, the more money you are likely to spend. To save money when choosing more expensive foods like meats and dairy: Choose cheaper cuts of meat, such as bone-in chicken thighs and drumsticks instead of skinless and boneless chicken. When you are ready to prepare the chicken, you can remove the skin yourself to make it healthier. Choose lean meats like chicken or turkey instead of beef. Choose canned seafood, such as tuna, salmon, or sardines. Buy eggs as a low-cost source of protein. Buy dried beans and peas, such as lentils, split peas, or kidney beans instead of meats. Dried beans and peas are a good alternative source of protein. Buy the larger tubs of yogurt instead of individual-sized containers. Choose water instead of sodas and other sweetened beverages. Avoid buying chips, cookies, and other "junk food." These items are usually expensive and not healthy. Cooking Make extra food and freeze the extras in meal-sized containers or in individual portions for fast meals and snacks. Pre-cook on days when you have   extra time to prepare meals in advance. You can keep these meals in the fridge or freezer and reheat for a quick meal. When you come home from the grocery store, wash, peel, and cut fruits and vegetables so they are ready to use and eat. This will help reduce food waste. Meal planning Do not eat out or get fast food. Prepare food at home. Make a grocery  list and make sure to bring it with you to the store. If you have a smart phone, you could use your phone to create your shopping list. Plan meals and snacks according to a grocery list and budget you create. Use leftovers in your meal plan for the week. Look for recipes where you can cook once and make enough food for two meals. Prepare budget-friendly types of meals like stews, casseroles, and stir-fry dishes. Try some meatless meals or try "no cook" meals like salads. Make sure that half your plate is filled with fruits or vegetables. Choose from fresh, frozen, or canned fruits and vegetables. If eating canned, remember to rinse them before eating. This will remove any excess salt added for packaging. Summary Eating healthy on a budget is possible if you plan your meals according to your budget, purchase according to your budget and grocery list, and prepare food yourself. Tips for buying more food on a limited budget include buying generic brands, using coupons only for foods you normally buy, and buying healthy items from the bulk bins when available. Tips for buying cheaper food to replace expensive food include choosing cheaper, lean cuts of meat, and buying dried beans and peas. This information is not intended to replace advice given to you by your health care provider. Make sure you discuss any questions you have with your health care provider. Document Revised: 04/15/2020 Document Reviewed: 04/15/2020 Elsevier Patient Education  2023 [employer]  

## 2022-01-09 ENCOUNTER — Other Ambulatory Visit: Payer: Self-pay | Admitting: Internal Medicine

## 2022-01-09 DIAGNOSIS — I10 Essential (primary) hypertension: Secondary | ICD-10-CM

## 2022-02-02 ENCOUNTER — Encounter: Payer: Self-pay | Admitting: Internal Medicine

## 2022-03-07 ENCOUNTER — Encounter: Payer: Self-pay | Admitting: Internal Medicine

## 2022-03-07 ENCOUNTER — Ambulatory Visit (INDEPENDENT_AMBULATORY_CARE_PROVIDER_SITE_OTHER): Payer: PPO | Admitting: Internal Medicine

## 2022-03-07 VITALS — BP 136/76 | HR 76 | Temp 97.7°F | Resp 17 | Ht 74.0 in | Wt 238.4 lb

## 2022-03-07 DIAGNOSIS — N138 Other obstructive and reflux uropathy: Secondary | ICD-10-CM

## 2022-03-07 DIAGNOSIS — E1122 Type 2 diabetes mellitus with diabetic chronic kidney disease: Secondary | ICD-10-CM

## 2022-03-07 DIAGNOSIS — Z0000 Encounter for general adult medical examination without abnormal findings: Secondary | ICD-10-CM | POA: Diagnosis not present

## 2022-03-07 DIAGNOSIS — M1000 Idiopathic gout, unspecified site: Secondary | ICD-10-CM | POA: Diagnosis not present

## 2022-03-07 DIAGNOSIS — Z8249 Family history of ischemic heart disease and other diseases of the circulatory system: Secondary | ICD-10-CM | POA: Diagnosis not present

## 2022-03-07 DIAGNOSIS — N401 Enlarged prostate with lower urinary tract symptoms: Secondary | ICD-10-CM

## 2022-03-07 DIAGNOSIS — Z87891 Personal history of nicotine dependence: Secondary | ICD-10-CM

## 2022-03-07 DIAGNOSIS — Z125 Encounter for screening for malignant neoplasm of prostate: Secondary | ICD-10-CM

## 2022-03-07 DIAGNOSIS — N2581 Secondary hyperparathyroidism of renal origin: Secondary | ICD-10-CM

## 2022-03-07 DIAGNOSIS — Z0001 Encounter for general adult medical examination with abnormal findings: Secondary | ICD-10-CM

## 2022-03-07 DIAGNOSIS — Z136 Encounter for screening for cardiovascular disorders: Secondary | ICD-10-CM

## 2022-03-07 DIAGNOSIS — I10 Essential (primary) hypertension: Secondary | ICD-10-CM | POA: Diagnosis not present

## 2022-03-07 DIAGNOSIS — E1169 Type 2 diabetes mellitus with other specified complication: Secondary | ICD-10-CM | POA: Diagnosis not present

## 2022-03-07 DIAGNOSIS — N182 Chronic kidney disease, stage 2 (mild): Secondary | ICD-10-CM | POA: Diagnosis not present

## 2022-03-07 DIAGNOSIS — E785 Hyperlipidemia, unspecified: Secondary | ICD-10-CM | POA: Diagnosis not present

## 2022-03-07 DIAGNOSIS — Z1211 Encounter for screening for malignant neoplasm of colon: Secondary | ICD-10-CM

## 2022-03-07 DIAGNOSIS — I700 Atherosclerosis of aorta: Secondary | ICD-10-CM | POA: Diagnosis not present

## 2022-03-07 DIAGNOSIS — E559 Vitamin D deficiency, unspecified: Secondary | ICD-10-CM

## 2022-03-07 DIAGNOSIS — Z79899 Other long term (current) drug therapy: Secondary | ICD-10-CM

## 2022-03-07 NOTE — Progress Notes (Signed)
Annual  Screening/Preventative Visit  & Comprehensive Evaluation & Examination   Future Appointments  Date Time Provider Department  11/29/2022 11:00 AM Darrol Jump, NP [facility]  03/12/2023  3:00 PM [name], MD [facility]              This very nice 81 y.o. DBM presents for a Screening /Preventative Visit & comprehensive evaluation and management of multiple medical co-morbidities.  Patient has been followed for HTN, HLD, T2_NIDDM  and Vitamin D Deficiency. Patient's Gout is controlled on Allopurinol.  Patient has remote hx/o Prostate Ca treated by radiation & in remission.        HTN predates since 1989. Patient's BP has been controlled at home.  Today's BP was initially elevated & rechecked at goal - 136/76 .  In 2008 , he had a negative heart cath after a False (+) Cardiolite. Patient denies any cardiac symptoms as chest pain, palpitations, shortness of breath, dizziness or ankle swelling.       Patient's hyperlipidemia is controlled with diet and Atorvastatin. Patient denies myalgias or other medication SE's. Lipids are at goal :  Lab Results  Component Value Date   CHOL 139 03/07/2022   HDL 73 03/07/2022   LDLCALC 54 03/07/2022   TRIG 42 03/07/2022   CHOLHDL 1.9 03/07/2022           Patient has Moderate Obesity (BMI 34+) and T2_NIDDM (2005) with CKD2  (GFR 75) and patient denies reactive hypoglycemic symptoms, visual blurring, diabetic polys or paresthesias. A1c is at  goal :   Lab Results  Component Value Date   HGBA1C 5.6 03/07/2022          Finally, patient has history of Vitamin D Deficiency ("29" /2008) and last vitamin D was at goal :  Lab Results  Component Value Date   VD25OH 87 10/13/2021        Lab Results   VD25OH     Outpatient Medications   Medication Sig   allopurinol (ZYLOPRIM) 300 MG tablet TAKE 1 TABLET BY MOUTH ONCE DAILY TO  PREVENT  GOUT   amLODipine (NORVASC) 5 MG tablet Take 1 tablet  Daily for BP   Ascorbic Acid  (VITAMIN C) 1000 MG tablet Take  daily.   aspirin 81 MG tablet Take daily.   atorvastatin (LIPITOR) 20 MG tablet TAKE 1 TABLET  DAILY    bumetanide (BUMEX) 2 MG tablet Take     1 tablet      2x  /day for BP and  Fluid Retention /Ankle Swelling   Cholecalciferol (VITAMIN D  Take 10,000 Units by mouth.    glucose blood (ONETOUCH ULTRA) test strip Test blood sugar once daily   ipratropium (ATROVENT) 0.06 % nasal spray Use 1 to 2 sprays each nostril 2 to 3 x /day as needed   Lancets MISC CHECK BLOOD SUGAR 1 TIME DAILY-E11.9   MAGNESIUM PO Take 500 mg by mouth 2 (two) times daily.    metFORMIN (GLUCOPHAGE-XR) 500 MG 24 hr tablet TAKE 2 TABLETS BY MOUTH TWICE DAILY WITH MEALS FOR DIABETES   mometasone (NASONEX) 50 MCG/ACT nasal spray Place 1 spray into the nose 2 (two) times daily as needed. 1 spray into each nostril.   montelukast (SINGULAIR) 10 MG tablet TAKE 1 TABLET BY MOUTH ONCE DAILY FOR ALLERGIES   zinc gluconate 50 MG tablet Take 50 mg by mouth daily.     Allergies  Allergen Reactions   Ace Inhibitors      Past  Medical History:  Diagnosis Date   Adenomatous colon polyp    2010   Gout    Hyperlipidemia    Hypertension    Prostate cancer ([place]) 2010   treated by external beam radiation   Type II or unspecified type diabetes mellitus without mention of complication, not stated as uncontrolled    Vitamin D deficiency      Health Maintenance  Topic Date Due   Zoster Vaccines- Shingrix (1 of 2) Never done   OPHTHALMOLOGY EXAM  06/30/2020   FOOT EXAM  01/11/2021   URINE MICROALBUMIN  01/12/2021   COVID-19 Vaccine 5 - Booster  02/26/2021   INFLUENZA VACCINE  02/14/2021   HEMOGLOBIN A1C  05/29/2021   COLONOSCOPY  07/21/2021   TETANUS/TDAP  03/21/2022   Hepatitis C Screening  Completed   PNA vac Low Risk Adult  Completed   HPV VACCINES  Aged Out     Immunization History  Administered Date(s) Administered   DT  03/21/2012   Influenza, High Dose  03/27/2019    Influenza-Unspecified 04/16/2014, 04/16/2015, 04/16/2018   Moderna Sars-Covid-2 Vacc 08/07/2019, 09/03/2019, 05/22/2020, 10/27/2020   Pneumococcal -  13 05/19/2014   Pneumococcal -  23 07/24/2007    Last Colon -  07/21/2014 - Dr Deatra Ina - recommended 7 yr f/u - due Jan 2023  Past Surgical History:  Procedure Laterality Date   APPENDECTOMY  1994   and [place]   colovesical fistula repair  01/2011   KNEE SURGERY  1996     Family History  Problem Relation Age of Onset   Colon cancer Neg Hx    Esophageal cancer Neg Hx    Rectal cancer Neg Hx    Stomach cancer Neg Hx     Social History   Socioeconomic History   Marital status: Divorced    Spouse name: Not on file   Number of children: Not on file   Years of education: Not on file   Highest education level: Not on file  Occupational History   Retired  Tobacco Use   Smoking status: Former    Types: Cigarettes    Quit date: 07/17/1985    Years since quitting: 35.6   Smokeless tobacco: Never  Substance and Sexual Activity   Alcohol use: No    Alcohol/week: 0.0 standard drinks   Drug use: No   Sexual activity: Not on file     ROS Constitutional: Denies fever, chills, weight loss/gain, headaches, insomnia,  night sweats or change in appetite. Does c/o fatigue. Eyes: Denies redness, blurred vision, diplopia, discharge, itchy or watery eyes.  ENT: Denies discharge, congestion, post nasal drip, epistaxis, sore throat, earache, hearing loss, dental pain, Tinnitus, Vertigo, Sinus pain or snoring.  Cardio: Denies chest pain, palpitations, irregular heartbeat, syncope, dyspnea, diaphoresis, orthopnea, PND, claudication or edema Respiratory: denies cough, dyspnea, DOE, pleurisy, hoarseness, laryngitis or wheezing.  Gastrointestinal: Denies dysphagia, heartburn, reflux, water brash, pain, cramps, nausea, vomiting, bloating, diarrhea, constipation, hematemesis, melena, hematochezia, jaundice or  hemorrhoids Genitourinary: Denies dysuria, frequency,  discharge, hematuria or flank pain. Has urgency, nocturia x 2-3 & occasional hesitancy. Musculoskeletal: Denies arthralgia, myalgia, stiffness, Jt. Swelling, pain, limp or strain/sprain. Denies Falls. Skin: Denies puritis, rash, hives, warts, acne, eczema or change in skin lesion Neuro: No weakness, tremor, incoordination, spasms, paresthesia or pain Psychiatric: Denies confusion, memory loss or sensory loss. Denies Depression. Endocrine: Denies change in weight, skin, hair change, nocturia, and paresthesia, diabetic polys, visual blurring or hyper / hypo  glycemic episodes.  Heme/Lymph: No excessive bleeding, bruising or enlarged lymph nodes.   Physical Exam  BP 136/76   Pulse 76   Temp 97.7 F (36.5 C)   Resp 17   Ht '6\' 2"'$  (1.88 m)   Wt 238 lb 6.4 oz (108.1 kg)   SpO2 96%   BMI 30.61 kg/m   General Appearance: Over nourished and well groomed and in no apparent distress.  Eyes: PERRLA, EOMs, conjunctiva no swelling or erythema, normal fundi and vessels. Sinuses: No frontal/maxillary tenderness ENT/Mouth: EACs patent / TMs  nl. Nares clear without erythema, swelling, mucoid exudates. Oral hygiene is good. No erythema, swelling, or exudate. Tongue normal, non-obstructing. Tonsils not swollen or erythematous. Hearing normal.  Neck: Supple, thyroid not palpable. No bruits, nodes or JVD. Respiratory: Respiratory effort normal.  BS equal and clear bilateral without rales, rhonci, wheezing or stridor. Cardio: Heart sounds are normal with regular rate and rhythm and no murmurs, rubs or gallops. Peripheral pulses are normal and equal bilaterally without edema. No aortic or femoral bruits. Chest: symmetric with normal excursions and percussion.  Abdomen: Soft, with Nl bowel sounds. Nontender, no guarding, rebound, hernias, masses, or organomegaly.  Lymphatics: Non tender without lymphadenopathy.  Musculoskeletal: Full ROM all peripheral  extremities, joint stability, 5/5 strength, and normal gait. Skin: Warm and dry without rashes, lesions, cyanosis, clubbing or  ecchymosis.  Neuro: Cranial nerves intact, reflexes equal bilaterally. Normal muscle tone, no cerebellar symptoms. Sensation intact.  Pysch: Alert and oriented X 3 with normal affect, insight and judgment appropriate.   Assessment and Plan  1. Annual Preventative/Screening Exam   2. Essential hypertension  - EKG 12-Lead - [place], RETROPERITNL ABD,  LTD - Urinalysis, Routine w reflex microscopic - Microalbumin / creatinine urine ratio - CBC with Differential/Platelet - COMPLETE METABOLIC PANEL WITH GFR - Magnesium - TSH  3. Hyperlipidemia associated with type 2 diabetes mellitus ([place])  - EKG 12-Lead - [place], RETROPERITNL ABD,  LTD - Lipid panel - TSH  4. Type 2 diabetes mellitus with stage 2 chronic kidney  disease, without long-term current use of insulin ([facility])  - EKG 12-Lead - [place], RETROPERITNL ABD,  LTD - HM DIABETES FOOT EXAM - LOW EXTREMITY NEUR EXAM DOCUM - Hemoglobin A1c - Insulin, random  5. Vitamin D deficiency  - VITAMIN D 25 Hydroxy   6. Idiopathic gout  - Uric acid  7. BPH with obstruction/lower urinary tract symptoms  - PSA  8. H/O prostate cancer (2006)  - PSA  9. Screening for colorectal cancer  - POC Hemoccult Bld/Stl   10. Prostate cancer screening  - PSA  11. Screening for ischemic heart disease  - EKG 12-Lead  12. FHx: heart disease  - EKG 12-Lead - [place], RETROPERITNL ABD,  LTD  13. Former smoker  - EKG 12-Lead - [place], RETROPERITNL ABD,  LTD  14. Screening for AAA (aortic abdominal aneurysm)  - [place], RETROPERITNL ABD,  LTD  15. Medication management - Urinalysis, Routine w reflex microscopic - Microalbumin / creatinine urine ratio - Uric acid - CBC with Differential/Platelet - COMPLETE METABOLIC PANEL WITH GFR - Magnesium - Lipid panel - TSH - Hemoglobin A1c - Insulin, random        Patient was  counseled in prudent diet, weight control to achieve/maintain BMI less than 25, BP monitoring, regular exercise and medications as discussed.  Discussed med effects and SE's. Routine screening labs and tests as requested with regular follow-up as recommended. Over 40 minutes of exam, counseling, chart  review and high complex critical decision making was performed   Kirtland Bouchard, MD

## 2022-03-07 NOTE — Patient Instructions (Signed)

## 2022-03-08 LAB — URINALYSIS, ROUTINE W REFLEX MICROSCOPIC
Bacteria, UA: NONE SEEN /HPF
Bilirubin Urine: NEGATIVE
Glucose, UA: NEGATIVE
Hgb urine dipstick: NEGATIVE
Hyaline Cast: NONE SEEN /LPF
Ketones, ur: NEGATIVE
Leukocytes,Ua: NEGATIVE
Nitrite: NEGATIVE
Protein, ur: NEGATIVE
Specific Gravity, Urine: 1.028 (ref 1.001–1.035)
Squamous Epithelial / HPF: NONE SEEN /HPF (ref <=5)
WBC, UA: NONE SEEN /HPF (ref 0–5)
pH: <=5 (ref 5.0–8.0)

## 2022-03-08 LAB — CBC WITH DIFFERENTIAL/PLATELET
Absolute Monocytes: 380 cells/uL (ref 200–950)
Basophils Absolute: 50 cells/uL (ref 0–200)
Basophils Relative: 1 %
Eosinophils Absolute: 190 cells/uL (ref 15–500)
Eosinophils Relative: 3.8 %
HCT: 36.6 % — ABNORMAL LOW (ref 38.5–50.0)
Hemoglobin: 11.9 g/dL — ABNORMAL LOW (ref 13.2–17.1)
Lymphs Abs: 930 cells/uL (ref 850–3900)
MCH: 27.3 pg (ref 27.0–33.0)
MCHC: 32.5 g/dL (ref 32.0–36.0)
MCV: 83.9 fL (ref 80.0–100.0)
MPV: 10.6 fL (ref 7.5–12.5)
Monocytes Relative: 7.6 %
Neutro Abs: 3450 cells/uL (ref 1500–7800)
Neutrophils Relative %: 69 %
Platelets: 246 10*3/uL (ref 140–400)
RBC: 4.36 10*6/uL (ref 4.20–5.80)
RDW: 14.7 % (ref 11.0–15.0)
Total Lymphocyte: 18.6 %
WBC: 5 10*3/uL (ref 3.8–10.8)

## 2022-03-08 LAB — COMPLETE METABOLIC PANEL WITH GFR
AG Ratio: 1.1 (calc) (ref 1.0–2.5)
ALT: 13 U/L (ref 9–46)
AST: 14 U/L (ref 10–35)
Albumin: 3.9 g/dL (ref 3.6–5.1)
Alkaline phosphatase (APISO): 60 U/L (ref 35–144)
BUN: 13 mg/dL (ref 7–25)
CO2: 24 mmol/L (ref 20–32)
Calcium: 10.2 mg/dL (ref 8.6–10.3)
Chloride: 108 mmol/L (ref 98–110)
Creat: 0.92 mg/dL (ref 0.70–1.22)
Globulin: 3.4 g/dL (calc) (ref 1.9–3.7)
Glucose, Bld: 89 mg/dL (ref 65–99)
Potassium: 3.8 mmol/L (ref 3.5–5.3)
Sodium: 141 mmol/L (ref 135–146)
Total Bilirubin: 0.6 mg/dL (ref 0.2–1.2)
Total Protein: 7.3 g/dL (ref 6.1–8.1)
eGFR: 84 mL/min/{1.73_m2} (ref >=60)

## 2022-03-08 LAB — HEMOGLOBIN A1C
Hgb A1c MFr Bld: 5.6 % of total Hgb (ref <5.7)
Mean Plasma Glucose: 114 mg/dL
eAG (mmol/L): 6.3 mmol/L

## 2022-03-08 LAB — PTH, INTACT AND CALCIUM
Calcium: 10.2 mg/dL (ref 8.6–10.3)
PTH: 59 pg/mL (ref 16–77)

## 2022-03-08 LAB — TSH: TSH: 1.1 mIU/L (ref 0.40–4.50)

## 2022-03-08 LAB — LIPID PANEL
Cholesterol: 139 mg/dL (ref <200)
HDL: 73 mg/dL (ref >=40)
LDL Cholesterol (Calc): 54 mg/dL (calc)
Non-HDL Cholesterol (Calc): 66 mg/dL (calc) (ref <130)
Total CHOL/HDL Ratio: 1.9 (calc) (ref <5.0)
Triglycerides: 42 mg/dL (ref <150)

## 2022-03-08 LAB — MAGNESIUM: Magnesium: 1.6 mg/dL (ref 1.5–2.5)

## 2022-03-08 LAB — MICROALBUMIN / CREATININE URINE RATIO
Creatinine, Urine: 242 mg/dL (ref 20–320)
Microalb Creat Ratio: 14 mcg/mg creat (ref <30)
Microalb, Ur: 3.5 mg/dL

## 2022-03-08 LAB — URIC ACID: Uric Acid, Serum: 3.5 mg/dL — ABNORMAL LOW (ref 4.0–8.0)

## 2022-03-08 LAB — INSULIN, RANDOM: Insulin: 7.4 u[IU]/mL

## 2022-03-08 NOTE — Progress Notes (Signed)
<><><><><><><><><><><><><><><><><><><><><><><><><><><><><><><><><> <><><><><><><><><><><><><><><><><><><><><><><><><><><><><><><><><>  -    Chronic mild Anemia is stable  <><><><><><><><><><><><><><><><><><><><><><><><><><><><><><><><><>  -  PTH hormone that regulates calcium balance is Normal  <><><><><><><><><><><><><><><><><><><><><><><><><><><><><><><><><>  -   Magnesium  -   1./6   -  very  low- goal is betw 2.0 - 2.5,   - So..............Marland Kitchen  Recommend that you take 2 tablets of Magnesium 500 mg tablet daily   - also important to eat lots of  leafy green vegetables   - spinach - Kale - collards - greens - okra - asparagus  - broccoli - quinoa - squash - almonds   - black, red, white beans  -  peas - green beans <><><><><><><><><><><><><><><><><><><><><><><><><><><><><><><><><>  -  A1c - back to normal - Non Diabetic - Great  !  <><><><><><><><><><><><><><><><><><><><><><><><><><><><><><><><><>  -  All Else - CBC - Kidneys - Electrolytes - Liver - Magnesium & Thyroid    - all  Normal / OK <><><><><><><><><><><><><><><><><><><><><><><><><><><><><><><><><> <><><><><><><><><><><><><><><><><><><><><><><><><><><><><><><><><>

## 2022-03-09 ENCOUNTER — Other Ambulatory Visit: Payer: Self-pay

## 2022-03-09 MED ORDER — IPRATROPIUM BROMIDE 0.06 % NA SOLN: NASAL | 3 refills | Status: DC

## 2022-03-10 ENCOUNTER — Encounter: Payer: Self-pay | Admitting: Internal Medicine

## 2022-03-10 DIAGNOSIS — Z136 Encounter for screening for cardiovascular disorders: Secondary | ICD-10-CM | POA: Insufficient documentation

## 2022-03-10 DIAGNOSIS — N2581 Secondary hyperparathyroidism of renal origin: Secondary | ICD-10-CM | POA: Insufficient documentation

## 2022-03-10 DIAGNOSIS — Z1211 Encounter for screening for malignant neoplasm of colon: Secondary | ICD-10-CM | POA: Insufficient documentation

## 2022-03-14 ENCOUNTER — Telehealth: Payer: Self-pay | Admitting: Internal Medicine

## 2022-03-14 NOTE — Telephone Encounter (Signed)
Patient is requesting that his most recent lab results be mailed to him.

## 2022-03-15 NOTE — Telephone Encounter (Signed)
Copy of labs sent.

## 2022-03-24 DIAGNOSIS — H524 Presbyopia: Secondary | ICD-10-CM | POA: Diagnosis not present

## 2022-03-24 DIAGNOSIS — H2513 Age-related nuclear cataract, bilateral: Secondary | ICD-10-CM | POA: Diagnosis not present

## 2022-03-24 DIAGNOSIS — H52223 Regular astigmatism, bilateral: Secondary | ICD-10-CM | POA: Diagnosis not present

## 2022-03-24 DIAGNOSIS — H4322 Crystalline deposits in vitreous body, left eye: Secondary | ICD-10-CM | POA: Diagnosis not present

## 2022-03-24 DIAGNOSIS — H25813 Combined forms of age-related cataract, bilateral: Secondary | ICD-10-CM | POA: Diagnosis not present

## 2022-03-24 DIAGNOSIS — H18413 Arcus senilis, bilateral: Secondary | ICD-10-CM | POA: Diagnosis not present

## 2022-03-24 DIAGNOSIS — H11133 Conjunctival pigmentations, bilateral: Secondary | ICD-10-CM | POA: Diagnosis not present

## 2022-03-24 DIAGNOSIS — H5203 Hypermetropia, bilateral: Secondary | ICD-10-CM | POA: Diagnosis not present

## 2022-03-24 DIAGNOSIS — H11153 Pinguecula, bilateral: Secondary | ICD-10-CM | POA: Diagnosis not present

## 2022-03-24 DIAGNOSIS — H25013 Cortical age-related cataract, bilateral: Secondary | ICD-10-CM | POA: Diagnosis not present

## 2022-03-24 DIAGNOSIS — H31093 Other chorioretinal scars, bilateral: Secondary | ICD-10-CM | POA: Diagnosis not present

## 2022-03-24 DIAGNOSIS — E119 Type 2 diabetes mellitus without complications: Secondary | ICD-10-CM | POA: Diagnosis not present

## 2022-03-24 LAB — HM DIABETES EYE EXAM

## 2022-04-22 ENCOUNTER — Other Ambulatory Visit: Payer: Self-pay | Admitting: Nurse Practitioner

## 2022-04-22 DIAGNOSIS — I10 Essential (primary) hypertension: Secondary | ICD-10-CM

## 2022-06-06 ENCOUNTER — Telehealth: Payer: Self-pay | Admitting: Pharmacy Technician

## 2022-06-11 ENCOUNTER — Other Ambulatory Visit: Payer: Self-pay | Admitting: Internal Medicine

## 2022-06-11 DIAGNOSIS — E1169 Type 2 diabetes mellitus with other specified complication: Secondary | ICD-10-CM

## 2022-06-12 ENCOUNTER — Ambulatory Visit: Payer: Self-pay | Admitting: Nurse Practitioner

## 2022-06-18 NOTE — Progress Notes (Unsigned)
FOLLOW UP Assessment:   Essential hypertension Well controlled. Continue medications  Monitor blood pressure at home; call if consistently over 130/80 Continue to implement lifestyle modifications by monitoring dietary intake of nutritionally dense foods, exercise, weight loss.    Reminder to go to the ER if any CP, SOB, nausea, dizziness, severe HA, changes vision/speech, left arm numbness and tingling and jaw pain.   Asymptomatic PVCs No acute or new events. Continue to monitor.   Type 2 diabetes mellitus with stage 2 chronic kidney disease, without long-term current use of insulin (HCC) Continue medications as directed - no changes. Education: Reviewed 'ABCs' of diabetes management  A1C (<7):  Goal Met. Blood pressure (<130/80):  Goal Met Cholesterol (LDL <70):  Goal Met Continue Eye Exam yearly  Continue Dental Exam Q6 mo Discussed dietary recommendations Discussed Physical Activity recommendations Foot exam UTD   CKD stage 2 due to type 2 diabetes mellitus ([place]) Discussed how what you eat and drink can aide in kidney protection. Stay well hydrated. Avoid high salt foods. Avoid NSAIDS. Keep BP and BG well controlled.   Take medications as prescribed. Remain active and exercise as tolerated daily. Maintain weight.  Continue to monitor. Check CMP/GFR/Microablumin  Hyperlipidemia associated with type 2 diabetes mellitus ([place]) Discussed lifestyle modifications. Recommended diet heavy in fruits and veggies, omega 3's. Decrease consumption of animal meats, cheeses, and dairy products. Remain active and exercise as tolerated. Continue to monitor. Check lipids/TSH   Hyperparathyroidism ([place]) Stable. Continue to monitor.   BPH with obstruction/lower urinary tract symptoms No new changes. Continue to monitor.  Idiopathic gout, unspecified chronicity, unspecified site Continue medication. Last Uric Acid Level at goal. No new events Diet discussed; Low  Purine Check uric acid  PRN  Obesity (BMI 30.0-34.9) Continue exercise, walking. Recommended diet heavy in fruits and veggies and low in animal meats, cheeses, and dairy products, appropriate calorie intake Next weight goal of 250 lb, long term goal <190lb per patient Follow up at next visit  Medication management All medications reviewed in full. All questions and concerns addressed.   Over 20 minutes of exam, counseling, chart review, and critical decision making was performed  Future Appointments  Date Time Provider [place]  06/19/2022 11:00 AM Darrol Jump, NP [facility] None  09/18/2022 10:30 AM [name], MD [facility] None  11/29/2022 11:00 AM Darrol Jump, NP [facility] None  03/12/2023  3:00 PM [name], MD [facility] None     Plan:   During the course of the visit the patient was educated and counseled about appropriate screening and preventive services including:   Pneumococcal vaccine  Influenza vaccine Prevnar 13 Td vaccine Screening electrocardiogram Colorectal cancer screening Diabetes screening Glaucoma screening Nutrition counseling    Subjective:  Marcus Chambers is a 81 y.o. male who presents for Medicare Annual Wellness Visit and 3 month follow up for HTN, hyperlipidemia, T2DM, and vitamin D Def.   He is not married, divorced.  He has no children.  Works as a Psychologist, occupational in [employer].  Enjoys his job.  Stays active in church community.  He has hx of prostate cancer circa 2010, underwent radiation, was followed annually by [facility] until 2019 and now monitored at this office. PSAs have been stable, denies urinary sx.   he has a diagnosis of anxiety/insomnia formerly on alprazolam, was able to taper off, hasn't been filled since 07/2019.   BMI is There is no height or weight on file to calculate BMI., he has been working on  diet, walks 3 days a week 20 min, trying to increase since weather is warmer.  Has lost 5 lb in  2 mo Wt Readings from Last 3 Encounters:  03/07/22 238 lb 6.4 oz (108.1 kg)  11/28/21 239 lb (108.4 kg)  10/13/21 244 lb 9.6 oz (110.9 kg)   His blood pressure has been controlled at home (130/75), today their BP is   He does workout. He denies chest pain, shortness of breath, dizziness.   He is on cholesterol medication (atorvastatin 20 mg daily) and denies myalgias. His cholesterol is at goal. The cholesterol last visit was:   Lab Results  Component Value Date   CHOL 139 03/07/2022   HDL 73 03/07/2022   LDLCALC 54 03/07/2022   TRIG 42 03/07/2022   CHOLHDL 1.9 03/07/2022   He has been working on diet and exercise for T2 diabetes controlled on metformin 2000 mg daily, and denies foot ulcerations, increased appetite, nausea, paresthesia of the feet, polydipsia, polyuria, visual disturbances, vomiting and weight loss. He checks sugars 2-3 days a week. Last A1C in the office was:  Lab Results  Component Value Date   HGBA1C 5.6 03/07/2022   He has CKD II associated with T2DM monitored at this office:  Lab Results  Component Value Date   GFRAA 75 11/26/2020    Patient is on Vitamin D supplement and at goal at recent check:   Lab Results  Component Value Date   VD25OH 87 10/13/2021     Patient is on allopurinol for gout and does not report a recent flare.  Lab Results  Component Value Date   LABURIC 3.5 (L) 03/07/2022     Medication Review:  Current Outpatient Medications (Endocrine & Metabolic):    metFORMIN (GLUCOPHAGE-XR) 500 MG 24 hr tablet, Take  2 tablets  2 x /day  with Meals  For Diabetes                                                 /                             TAKE                   BY                 MOUTH  Current Outpatient Medications (Cardiovascular):    amLODipine (NORVASC) 5 MG tablet, Take 1 tablet Daily for BP                                                                     /              Take                   by                      mouth  once daily   atorvastatin (LIPITOR) 20 MG tablet, Take  1 tablet  Daily  for Cholesterol                                                /                                    TAKE                                   BY                                MOUTH                                       ONCE DAILY   bumetanide (BUMEX) 2 MG tablet, Take     1 tablet      2x  /day for BP and  Fluid Retention /Ankle Swelling  Current Outpatient Medications (Respiratory):    ipratropium (ATROVENT) 0.06 % nasal spray, Use 1 to 2 sprays each nostril 2 to 3 x /day as needed   mometasone (NASONEX) 50 MCG/ACT nasal spray, Place 1 spray into the nose 2 (two) times daily as needed. 1 spray into each nostril.   montelukast (SINGULAIR) 10 MG tablet, Take  1 tablet  Daily  for Allergies   V                                                                   /                  TAKE BY MOUTH     ONCE DAILY  Current Outpatient Medications (Analgesics):    allopurinol (ZYLOPRIM) 300 MG tablet, Take  1 tablet  Daily  to Prevent Gout                                                                        /                          TAKE  BY MOUTH                    ONCE DAILY   aspirin 81 MG tablet, Take 81 mg by mouth daily.   Current Outpatient Medications (Other):    Ascorbic Acid (VITAMIN C) 1000 MG tablet, Take 1,000 mg by mouth daily.   blood glucose meter kit and supplies KIT, CHECK BLOOD SUGAR 1 TIME DAILY-E11.9  Cholecalciferol (VITAMIN D PO), Take 10,000 Units by mouth daily.   dicyclomine (BENTYL) 20 MG tablet, Take  1 tablet  4 x /day  before Meals & Bedtime  if needed for Nausea, Cramping or Diarrhea. (Patient not taking: Reported on 08/08/2021)   glucose blood (ONETOUCH ULTRA) test strip, Test blood sugar once daily   hyoscyamine (LEVSIN SL) 0.125 MG SL tablet, Dissolve 1 tablet under tongue every 4 hours if needed for Nausea, Bloating, Cramping or Diarrhea.   Lancets MISC, CHECK BLOOD SUGAR 1 TIME  DAILY-E11.9   MAGNESIUM PO, Take 500 mg by mouth daily.   zinc gluconate 50 MG tablet, Take 50 mg by mouth daily.  Allergies: Allergies  Allergen Reactions   Ace Inhibitors     Current Problems (verified) has History of colonic polyps; Type 2 diabetes mellitus with stage 2 chronic kidney disease, without long-term current use of insulin ([place]); Hyperlipidemia associated with type 2 diabetes mellitus ([place]); Idiopathic gout; Vitamin D deficiency; Essential hypertension; Screening for heart disease; H/O prostate cancer (2006); Obesity (BMI 30.0-34.9); Hypertensive retinopathy; CKD stage 2 due to type 2 diabetes mellitus ([place]); Situational anxiety ; ACEI/ARB contraindicated; Asymptomatic PVCs; FHx: heart disease; BPH with obstruction/lower urinary tract symptoms; Hyperparathyroidism ([place]); Screening for AAA (abdominal aortic aneurysm); Screening for colorectal cancer; and Hyperparathyroidism, secondary ([place]) on their problem list.  Screening Tests Immunization History  Administered Date(s) Administered   DT (Pediatric) 03/21/2012   Influenza, High Dose Seasonal PF 03/27/2019   Influenza-Unspecified 04/16/2014, 04/16/2015, 04/16/2018, 04/18/2021   Moderna Sars-Covid-2 Vaccination 08/07/2019, 09/03/2019, 05/22/2020, 10/27/2020   Pneumococcal Conjugate-13 05/19/2014   Pneumococcal Polysaccharide-23 07/24/2007    Preventative care: Last colonoscopy: 2016 due 07/2021 - Dr. Deatra Ina.   Patient to reach out to office if wanting to schedule updated Colonoscopy. Evidence indicates that the net benefit of screening all persons in this age group is small. In determining whether this service is appropriate in individual cases, patients and clinicians should consider the patient's overall health, prior screening history, and  preferences.  Prior vaccinations: TD or Tdap: 2013  Influenza: 2022 Pneumococcal: 2009  Prevnar13: 2015 Shingles/Zostavax: declines, cost Covid 19: 2/2 2021  Names of Other  Physician/Practitioners you currently use: 1. [facility] here for primary care 2. Dr. Rosana Hoes in [place] [facility]), UTD:  10/2020,  3. Dr. , dentist, overdue - Patient to schedule  Patient Care Team: [name], MD as PCP - General (Internal Medicine) Inda Castle, MD (Inactive) as Consulting Physician (Gastroenterology) Rana Snare, MD (Inactive) as Consulting Physician (Urology) Johnathan Hausen, MD as Consulting Physician (General Surgery) Newton Pigg, [facility] (Inactive) as Pharmacist (Pharmacist)  Surgical: He  has a past surgical history that includes Appendectomy (1994); Cholecystectomy (1994); Knee surgery (1996); and colovesical fistula repair (01/2011). Family His family history is not on file. Social history  He reports that he quit smoking about 36 years ago. His smoking use included cigarettes. He has never used smokeless tobacco. He reports that he does not drink alcohol and does not use drugs.  MEDICARE WELLNESS OBJECTIVES: Physical activity:   Cardiac risk factors:   Depression/mood screen:      03/10/2022    8:34 PM  Depression screen PHQ 2/9  Decreased Interest 0  Down, Depressed, Hopeless 0  PHQ - 2 Score 0    ADLs:     03/10/2022    8:35 PM 11/28/2021   11:08 AM  In your present state of health, do you have any difficulty performing the following  activities:  Hearing? 0 0  Vision? 0 0  Difficulty concentrating or making decisions? 0 0  Walking or climbing stairs? 0 0  Dressing or bathing? 0 0  Doing errands, shopping? 0 0  Preparing Food and eating ?  N  Using the Toilet?  N  In the past six months, have you accidently leaked urine?  N  Do you have problems with loss of bowel control?  N  Managing your Medications?  N  Managing your Finances?  N  Housekeeping or managing your Housekeeping?  N     Cognitive Testing  Alert? Yes  Normal Appearance?Yes  Oriented to person? Yes  Place? Yes   Time?  Yes  Recall of three objects?  Yes  Can perform simple calculations? Yes  Displays appropriate judgment?Yes  Can read the correct time from a watch face?Yes  EOL planning:     Objective:   There were no vitals filed for this visit.  There is no height or weight on file to calculate BMI.  General appearance: alert, no distress, WD/WN, obese male HEENT: normocephalic, sclerae anicteric, TMs pearly, nares patent, no discharge or erythema, pharynx normal Oral cavity: MMM, no lesions Neck: supple, no lymphadenopathy, no thyromegaly, no masses Heart: Rate regular, skipping every 2-3 beats, normal S1, S2, no murmurs Lungs: CTA bilaterally, no wheezes, rhonchi, or rales Abdomen: +bs, soft, pendulous, non tender, non distended, no masses, no hepatomegaly, no splenomegaly Musculoskeletal: nontender, no swelling, no obvious deformity Extremities: no edema, no cyanosis, no clubbing. Toe nails long, curved, thickened.  Pulses: 2+ symmetric, upper and lower extremities, normal cap refill Neurological: alert, oriented x 3, CN2-12 intact, strength normal upper extremities and lower extremities, sensation normal throughout, DTRs 2+ throughout, no cerebellar signs, gait slow steady Psychiatric: normal affect, behavior normal, pleasant   EKG 02/2021: frequent PAC/PVC's, NSR, no a. Fib -   Medicare Attestation I have personally reviewed: The patient's medical and social history Their use of alcohol, tobacco or illicit drugs Their current medications and supplements The patient's functional ability including ADLs,fall risks, home safety risks, cognitive, and hearing and visual impairment Diet and physical activities Evidence for depression or mood disorders  The patient's weight, height, BMI, and visual acuity have been recorded in the chart.  I have made referrals, counseling, and provided education to the patient based on review of the above and I have provided the patient with a written  personalized care plan for preventive services.     Darrol Jump, NP   06/18/2022

## 2022-06-19 ENCOUNTER — Encounter: Payer: Self-pay | Admitting: Nurse Practitioner

## 2022-06-19 ENCOUNTER — Ambulatory Visit (INDEPENDENT_AMBULATORY_CARE_PROVIDER_SITE_OTHER): Payer: PPO | Admitting: Nurse Practitioner

## 2022-06-19 VITALS — BP 150/90 | HR 65 | Temp 96.9°F | Ht 74.0 in | Wt 237.0 lb

## 2022-06-19 DIAGNOSIS — N138 Other obstructive and reflux uropathy: Secondary | ICD-10-CM

## 2022-06-19 DIAGNOSIS — E669 Obesity, unspecified: Secondary | ICD-10-CM

## 2022-06-19 DIAGNOSIS — M1000 Idiopathic gout, unspecified site: Secondary | ICD-10-CM

## 2022-06-19 DIAGNOSIS — N401 Enlarged prostate with lower urinary tract symptoms: Secondary | ICD-10-CM

## 2022-06-19 DIAGNOSIS — N182 Chronic kidney disease, stage 2 (mild): Secondary | ICD-10-CM

## 2022-06-19 DIAGNOSIS — N2581 Secondary hyperparathyroidism of renal origin: Secondary | ICD-10-CM | POA: Diagnosis not present

## 2022-06-19 DIAGNOSIS — E66811 Obesity, class 1: Secondary | ICD-10-CM

## 2022-06-19 DIAGNOSIS — I10 Essential (primary) hypertension: Secondary | ICD-10-CM

## 2022-06-19 DIAGNOSIS — E1169 Type 2 diabetes mellitus with other specified complication: Secondary | ICD-10-CM

## 2022-06-19 DIAGNOSIS — E785 Hyperlipidemia, unspecified: Secondary | ICD-10-CM | POA: Diagnosis not present

## 2022-06-19 DIAGNOSIS — E1122 Type 2 diabetes mellitus with diabetic chronic kidney disease: Secondary | ICD-10-CM

## 2022-06-19 DIAGNOSIS — Z79899 Other long term (current) drug therapy: Secondary | ICD-10-CM

## 2022-06-19 DIAGNOSIS — I493 Ventricular premature depolarization: Secondary | ICD-10-CM

## 2022-06-19 NOTE — Patient Instructions (Signed)

## 2022-06-20 LAB — COMPLETE METABOLIC PANEL WITH GFR
AG Ratio: 1.2 (calc) (ref 1.0–2.5)
ALT: 14 U/L (ref 9–46)
AST: 17 U/L (ref 10–35)
Albumin: 4.1 g/dL (ref 3.6–5.1)
Alkaline phosphatase (APISO): 62 U/L (ref 35–144)
BUN: 13 mg/dL (ref 7–25)
CO2: 26 mmol/L (ref 20–32)
Calcium: 9.9 mg/dL (ref 8.6–10.3)
Chloride: 107 mmol/L (ref 98–110)
Creat: 0.86 mg/dL (ref 0.70–1.22)
Globulin: 3.3 g/dL (calc) (ref 1.9–3.7)
Glucose, Bld: 83 mg/dL (ref 65–99)
Potassium: 4 mmol/L (ref 3.5–5.3)
Sodium: 140 mmol/L (ref 135–146)
Total Bilirubin: 0.6 mg/dL (ref 0.2–1.2)
Total Protein: 7.4 g/dL (ref 6.1–8.1)
eGFR: 87 mL/min/{1.73_m2} (ref >=60)

## 2022-06-20 LAB — CBC WITH DIFFERENTIAL/PLATELET
Absolute Monocytes: 395 cells/uL (ref 200–950)
Basophils Absolute: 42 cells/uL (ref 0–200)
Basophils Relative: 0.8 %
Eosinophils Absolute: 83 cells/uL (ref 15–500)
Eosinophils Relative: 1.6 %
HCT: 37.2 % — ABNORMAL LOW (ref 38.5–50.0)
Hemoglobin: 12.4 g/dL — ABNORMAL LOW (ref 13.2–17.1)
Lymphs Abs: 1040 cells/uL (ref 850–3900)
MCH: 28 pg (ref 27.0–33.0)
MCHC: 33.3 g/dL (ref 32.0–36.0)
MCV: 84 fL (ref 80.0–100.0)
MPV: 11.2 fL (ref 7.5–12.5)
Monocytes Relative: 7.6 %
Neutro Abs: 3640 cells/uL (ref 1500–7800)
Neutrophils Relative %: 70 %
Platelets: 235 10*3/uL (ref 140–400)
RBC: 4.43 10*6/uL (ref 4.20–5.80)
RDW: 14.4 % (ref 11.0–15.0)
Total Lymphocyte: 20 %
WBC: 5.2 10*3/uL (ref 3.8–10.8)

## 2022-07-18 ENCOUNTER — Other Ambulatory Visit: Payer: Self-pay | Admitting: Internal Medicine

## 2022-07-18 DIAGNOSIS — J302 Other seasonal allergic rhinitis: Secondary | ICD-10-CM

## 2022-08-11 ENCOUNTER — Other Ambulatory Visit: Payer: Self-pay | Admitting: Nurse Practitioner

## 2022-08-11 DIAGNOSIS — E1122 Type 2 diabetes mellitus with diabetic chronic kidney disease: Secondary | ICD-10-CM

## 2022-09-17 ENCOUNTER — Encounter: Payer: Self-pay | Admitting: Internal Medicine

## 2022-09-17 NOTE — Patient Instructions (Signed)

## 2022-09-17 NOTE — Progress Notes (Unsigned)
Future Appointments  Date Time Provider Department  09/18/2022 10:30 AM [name], MD [facility]  11/29/2022                 wellness 11:00 AM Darrol Jump, NP [facility]  03/12/2023                  cpe  3:00 PM [name], MD [facility]    History of Present Illness:       This very nice 82 y.o.  DBM presents for 6 month follow up with HTN, HLD, T2_NIDDM and Vitamin D Deficiency. Patient has Gout controlled on his allopurinol.         Patient is treated for HTN  ( 1989 ) & BP has been controlled at home. Today's BP is at goal -  130/70 .  In 2008, patient had a false (+) Cardiolite with a negative  hearth cath.  Patient has had no complaints of any cardiac type chest pain, palpitations, dyspnea / orthopnea / PND, dizziness, claudication, or dependent edema.        Hyperlipidemia is controlled with diet & Atorvastatin. Patient denies myalgias or other med SE's. Last Lipids were at goal :  Lab Results  Component Value Date   CHOL 139 03/07/2022   HDL 73 03/07/2022   LDLCALC 54 03/07/2022   TRIG 42 03/07/2022   CHOLHDL 1.9 03/07/2022     Also, the patient has history of Moderate Obesity (BMI 34+) and T2_NIDDM (2005) with CKD2  (GFR 79 )  and has had no symptoms of reactive hypoglycemia, diabetic polys, paresthesias or visual blurring.  Last A1c was normal & at goal :  Lab Results  Component Value Date   HGBA1C 5.6 03/07/2022                                                       Further, the patient also has history of Vitamin D Deficiency ("29" /2008) and supplements vitamin D without any suspected side-effects. Last vitamin D was at goal :  Lab Results  Component Value Date   VD25OH 87 10/13/2021        Current Outpatient Medications on File Prior to Visit  Medication Sig   allopurinol  300 MG tablet Take  1 tablet  Daily                    amLODipine 5 MG tablet Take 1 tablet  Daily    VITAMIN C 1000 MG  Take daily.   aspirin 81 MG tablet Take  daily.   atorvastatin 20 MG tablet Take 1 tablet  Daily                             bumetanide 2 MG tablet Take 1 tablet 2  x /day    VITAMIN D 10,000 Units Take daily.   ipratropium  0.06 % nasal spray Use 1 to 2 sprays each nostril 2 to 3 x /day as needed   MAGNESIUM 500 mg  Take daily.   metFORMIN-XR 500 MG  Take  2 tablets  2 x /day  with Meals     mometasone  nasal spray Place 1 spray into the nose 2  times daily    montelukast 10  MG tablet Take  1 tablet  Daily     zinc 50 MG tablet Take 5\daily.     Allergies  Allergen Reactions   Ace Inhibitors      PMHx:   Past Medical History:  Diagnosis Date   Adenomatous colon polyp    2010   Gout    Hyperlipidemia    Hypertension    Prostate cancer ([place]) 2010   treated by external beam radiation   Type II diabetes mellitus     Vitamin D deficiency      Immunization History  Administered Date(s) Administered   DT  03/21/2012   Influenza, High Dose  03/27/2019   Influenza 04/16/2014, 04/16/2015, 04/16/2018, 04/18/2021   Moderna Sars-Covid-2 Vacc 08/07/2019, 09/03/2019, 05/22/2020, 10/27/2020   Pneumococcal - 13 05/19/2014   Pneumococcal - 23 07/24/2007     Past Surgical History:  Procedure Laterality Date   APPENDECTOMY  1994   and [place]   colovesical fistula repair  01/2011   KNEE SURGERY  1996    FHx:    Reviewed / unchanged  SHx:    Reviewed / unchanged   Systems Review:  Constitutional: Denies fever, chills, wt changes, headaches, insomnia, fatigue, night sweats, change in appetite. Eyes: Denies redness, blurred vision, diplopia, discharge, itchy, watery eyes.  ENT: Denies discharge, congestion, post nasal drip, epistaxis, sore throat, earache, hearing loss, dental pain, tinnitus, vertigo, sinus pain, snoring.  CV: Denies chest pain, palpitations, irregular heartbeat, syncope, dyspnea, diaphoresis, orthopnea, PND, claudication or edema. Respiratory: denies cough, dyspnea, DOE,  pleurisy, hoarseness, laryngitis, wheezing.  Gastrointestinal: Denies dysphagia, odynophagia, heartburn, reflux, water brash, abdominal pain or cramps, nausea, vomiting, bloating, diarrhea, constipation, hematemesis, melena, hematochezia  or hemorrhoids. Genitourinary: Denies dysuria, frequency, urgency, nocturia, hesitancy, discharge, hematuria or flank pain. Musculoskeletal: Denies arthralgias, myalgias, stiffness, jt. swelling, pain, limping or strain/sprain.  Skin: Denies pruritus, rash, hives, warts, acne, eczema or change in skin lesion(s). Neuro: No weakness, tremor, incoordination, spasms, paresthesia or pain. Psychiatric: Denies confusion, memory loss or sensory loss. Endo: Denies change in weight, skin or hair change.  Heme/Lymph: No excessive bleeding, bruising or enlarged lymph nodes.  Physical Exam  BP 130/70   Pulse 65   Temp 97.9 F (36.6 C)   Resp 16   Ht '6\' 2"'$  (1.88 m)   Wt 235 lb 3.2 oz (106.7 kg)   SpO2 99%   BMI 30.20 kg/m   Appears  over nourished and in no distress.  Eyes: PERRLA, EOMs, conjunctiva no swelling or erythema. Sinuses: No frontal/maxillary tenderness ENT/Mouth: EAC's clear, TM's nl w/o erythema, bulging. Nares clear w/o erythema, swelling, exudates. Oropharynx clear without erythema or exudates. Oral hygiene is good. Tongue normal, non obstructing. Hearing intact.  Neck: Supple. Thyroid not palpable. Car 2+/2+ without bruits, nodes or JVD. Chest: Respirations nl with BS clear & equal w/o rales, rhonchi, wheezing or stridor.  Cor: Heart sounds normal w/ regular rate and rhythm without sig. murmurs, gallops, clicks or rubs. Peripheral pulses normal and equal  without edema.  Abdomen: Soft & bowel sounds normal. Non-tender w/o guarding, rebound, hernias, masses or organomegaly.  Lymphatics: Unremarkable.  Musculoskeletal: Full ROM all peripheral extremities, joint stability, 5/5 strength and normal gait.  Skin: Warm, dry without exposed rashes,  lesions or ecchymosis apparent.  Neuro: Cranial nerves intact, reflexes equal bilaterally. Sensory-motor testing grossly intact. Tendon reflexes grossly intact.  Pysch: Alert & oriented x 3.  Insight and judgement nl & appropriate. No ideations.  Assessment  and Plan:  1. Essential hypertension  - Continue medication, monitor blood pressure at home.  - Continue DASH diet.  Reminder to go to the ER if any CP,  SOB, nausea, dizziness, severe HA, changes vision/speech.   - CBC with Differential/Platelet - COMPLETE METABOLIC PANEL WITH GFR - Magnesium - TSH  2. Hyperlipidemia associated with type 2 diabetes mellitus ([place])  - Continue diet/meds, exercise,& lifestyle modifications.  - Continue monitor periodic cholesterol/liver & renal functions    - Lipid panel - TSH  3. Type 2 diabetes mellitus with stage 2 chronic kidney  disease, without long-term current use of insulin (HCC)  - Continue diet, exercise  - Lifestyle modifications.  - Monitor appropriate labs     - Hemoglobin A1c - Insulin, random  4. Vitamin D deficiency  - Continue supplementation   - VITAMIN D 25 Hydroxy   5. Medication management  - CBC with Differential/Platelet - COMPLETE METABOLIC PANEL WITH GFR - Magnesium - Lipid panel - TSH - Hemoglobin A1c - Insulin, random - VITAMIN D 25 Hydroxy         Discussed  regular exercise, BP monitoring, weight control to achieve/maintain BMI less than 25 and discussed med and SE's. Recommended labs to assess and monitor clinical status with further disposition pending results of labs.  I discussed the assessment and treatment plan with the patient. The patient was provided an opportunity to ask questions and all were answered. The patient agreed with the plan and demonstrated an understanding of the instructions.  I provided over 30 minutes of exam, counseling, chart review and  complex critical decision making.        The patient was advised to call back or  seek an in-person evaluation if the symptoms worsen or if the condition fails to improve as anticipated.   Kirtland Bouchard, MD

## 2022-09-18 ENCOUNTER — Ambulatory Visit (INDEPENDENT_AMBULATORY_CARE_PROVIDER_SITE_OTHER): Payer: Medicare HMO | Admitting: Internal Medicine

## 2022-09-18 ENCOUNTER — Encounter: Payer: Self-pay | Admitting: Internal Medicine

## 2022-09-18 VITALS — BP 130/70 | HR 65 | Temp 97.9°F | Resp 16 | Ht 74.0 in | Wt 235.2 lb

## 2022-09-18 DIAGNOSIS — Z79899 Other long term (current) drug therapy: Secondary | ICD-10-CM

## 2022-09-18 DIAGNOSIS — E785 Hyperlipidemia, unspecified: Secondary | ICD-10-CM

## 2022-09-18 DIAGNOSIS — E1122 Type 2 diabetes mellitus with diabetic chronic kidney disease: Secondary | ICD-10-CM | POA: Diagnosis not present

## 2022-09-18 DIAGNOSIS — E559 Vitamin D deficiency, unspecified: Secondary | ICD-10-CM | POA: Diagnosis not present

## 2022-09-18 DIAGNOSIS — I10 Essential (primary) hypertension: Secondary | ICD-10-CM | POA: Diagnosis not present

## 2022-09-18 DIAGNOSIS — N182 Chronic kidney disease, stage 2 (mild): Secondary | ICD-10-CM

## 2022-09-18 DIAGNOSIS — E1169 Type 2 diabetes mellitus with other specified complication: Secondary | ICD-10-CM | POA: Diagnosis not present

## 2022-09-19 LAB — TSH: TSH: 1.17 mIU/L (ref 0.40–4.50)

## 2022-09-19 LAB — COMPLETE METABOLIC PANEL WITH GFR
AG Ratio: 1.3 (calc) (ref 1.0–2.5)
ALT: 18 U/L (ref 9–46)
AST: 21 U/L (ref 10–35)
Albumin: 4 g/dL (ref 3.6–5.1)
Alkaline phosphatase (APISO): 66 U/L (ref 35–144)
BUN: 12 mg/dL (ref 7–25)
CO2: 24 mmol/L (ref 20–32)
Calcium: 10.3 mg/dL (ref 8.6–10.3)
Chloride: 106 mmol/L (ref 98–110)
Creat: 0.98 mg/dL (ref 0.70–1.22)
Globulin: 3.2 g/dL (calc) (ref 1.9–3.7)
Glucose, Bld: 88 mg/dL (ref 65–99)
Potassium: 4.2 mmol/L (ref 3.5–5.3)
Sodium: 140 mmol/L (ref 135–146)
Total Bilirubin: 0.8 mg/dL (ref 0.2–1.2)
Total Protein: 7.2 g/dL (ref 6.1–8.1)
eGFR: 77 mL/min/{1.73_m2} (ref >=60)

## 2022-09-19 LAB — CBC WITH DIFFERENTIAL/PLATELET
Absolute Monocytes: 413 cells/uL (ref 200–950)
Basophils Absolute: 61 cells/uL (ref 0–200)
Basophils Relative: 1.2 %
Eosinophils Absolute: 71 cells/uL (ref 15–500)
Eosinophils Relative: 1.4 %
HCT: 36.3 % — ABNORMAL LOW (ref 38.5–50.0)
Hemoglobin: 12 g/dL — ABNORMAL LOW (ref 13.2–17.1)
Lymphs Abs: 1030 cells/uL (ref 850–3900)
MCH: 27.3 pg (ref 27.0–33.0)
MCHC: 33.1 g/dL (ref 32.0–36.0)
MCV: 82.7 fL (ref 80.0–100.0)
MPV: 10.9 fL (ref 7.5–12.5)
Monocytes Relative: 8.1 %
Neutro Abs: 3524 cells/uL (ref 1500–7800)
Neutrophils Relative %: 69.1 %
Platelets: 245 10*3/uL (ref 140–400)
RBC: 4.39 10*6/uL (ref 4.20–5.80)
RDW: 15 % (ref 11.0–15.0)
Total Lymphocyte: 20.2 %
WBC: 5.1 10*3/uL (ref 3.8–10.8)

## 2022-09-19 LAB — MAGNESIUM: Magnesium: 1.7 mg/dL (ref 1.5–2.5)

## 2022-09-19 LAB — LIPID PANEL
Cholesterol: 148 mg/dL (ref <200)
HDL: 72 mg/dL (ref >=40)
LDL Cholesterol (Calc): 62 mg/dL (calc)
Non-HDL Cholesterol (Calc): 76 mg/dL (calc) (ref <130)
Total CHOL/HDL Ratio: 2.1 (calc) (ref <5.0)
Triglycerides: 49 mg/dL (ref <150)

## 2022-09-19 LAB — VITAMIN D 25 HYDROXY (VIT D DEFICIENCY, FRACTURES): Vit D, 25-Hydroxy: 104 ng/mL — ABNORMAL HIGH (ref 30–100)

## 2022-09-19 LAB — HEMOGLOBIN A1C
Hgb A1c MFr Bld: 6 % of total Hgb — ABNORMAL HIGH (ref <5.7)
Mean Plasma Glucose: 126 mg/dL
eAG (mmol/L): 7 mmol/L

## 2022-09-19 LAB — INSULIN, RANDOM: Insulin: 7.7 u[IU]/mL

## 2022-09-22 NOTE — Progress Notes (Signed)
Patient is aware of lab results and instructions. -e welch

## 2022-09-26 ENCOUNTER — Encounter: Payer: Self-pay | Admitting: Internal Medicine

## 2022-10-15 ENCOUNTER — Other Ambulatory Visit: Payer: Self-pay | Admitting: Nurse Practitioner

## 2022-10-15 DIAGNOSIS — J302 Other seasonal allergic rhinitis: Secondary | ICD-10-CM

## 2022-11-29 ENCOUNTER — Ambulatory Visit: Payer: Medicare HMO | Admitting: Nurse Practitioner

## 2022-12-17 ENCOUNTER — Other Ambulatory Visit: Payer: Self-pay | Admitting: Internal Medicine

## 2022-12-20 ENCOUNTER — Ambulatory Visit (INDEPENDENT_AMBULATORY_CARE_PROVIDER_SITE_OTHER): Payer: Medicare HMO | Admitting: Nurse Practitioner

## 2022-12-20 ENCOUNTER — Encounter: Payer: Self-pay | Admitting: Nurse Practitioner

## 2022-12-20 VITALS — BP 136/70 | HR 57 | Temp 97.6°F | Ht 74.0 in | Wt 236.6 lb

## 2022-12-20 DIAGNOSIS — Z79899 Other long term (current) drug therapy: Secondary | ICD-10-CM | POA: Diagnosis not present

## 2022-12-20 DIAGNOSIS — N401 Enlarged prostate with lower urinary tract symptoms: Secondary | ICD-10-CM | POA: Diagnosis not present

## 2022-12-20 DIAGNOSIS — Z0000 Encounter for general adult medical examination without abnormal findings: Secondary | ICD-10-CM

## 2022-12-20 DIAGNOSIS — Z860100 Personal history of colon polyps, unspecified: Secondary | ICD-10-CM

## 2022-12-20 DIAGNOSIS — E785 Hyperlipidemia, unspecified: Secondary | ICD-10-CM | POA: Diagnosis not present

## 2022-12-20 DIAGNOSIS — E669 Obesity, unspecified: Secondary | ICD-10-CM | POA: Diagnosis not present

## 2022-12-20 DIAGNOSIS — E1169 Type 2 diabetes mellitus with other specified complication: Secondary | ICD-10-CM

## 2022-12-20 DIAGNOSIS — I493 Ventricular premature depolarization: Secondary | ICD-10-CM

## 2022-12-20 DIAGNOSIS — N138 Other obstructive and reflux uropathy: Secondary | ICD-10-CM

## 2022-12-20 DIAGNOSIS — N182 Chronic kidney disease, stage 2 (mild): Secondary | ICD-10-CM | POA: Diagnosis not present

## 2022-12-20 DIAGNOSIS — E66811 Obesity, class 1: Secondary | ICD-10-CM

## 2022-12-20 DIAGNOSIS — E1122 Type 2 diabetes mellitus with diabetic chronic kidney disease: Secondary | ICD-10-CM

## 2022-12-20 DIAGNOSIS — Z8546 Personal history of malignant neoplasm of prostate: Secondary | ICD-10-CM | POA: Diagnosis not present

## 2022-12-20 DIAGNOSIS — R6889 Other general symptoms and signs: Secondary | ICD-10-CM

## 2022-12-20 DIAGNOSIS — Z0001 Encounter for general adult medical examination with abnormal findings: Secondary | ICD-10-CM | POA: Diagnosis not present

## 2022-12-20 DIAGNOSIS — I10 Essential (primary) hypertension: Secondary | ICD-10-CM

## 2022-12-20 DIAGNOSIS — M1000 Idiopathic gout, unspecified site: Secondary | ICD-10-CM | POA: Diagnosis not present

## 2022-12-20 DIAGNOSIS — E559 Vitamin D deficiency, unspecified: Secondary | ICD-10-CM | POA: Diagnosis not present

## 2022-12-20 NOTE — Progress Notes (Signed)
MEDICARE ANNUAL WELLNESS VISIT AND FOLLOW UP Assessment:   Encounter for Medicare annual wellness exam Due Annually  Health maintenance reviewed Healthy lifestyle goals set  Essential hypertension Well controlled. Discussed DASH (Dietary Approaches to Stop Hypertension) DASH diet is lower in sodium than a typical American diet. Cut back on foods that are high in saturated fat, cholesterol, and trans fats. Eat more whole-grain foods, fish, poultry, and nuts Remain active and exercise as tolerated daily.  Monitor BP at home-Call if greater than 130/80.  Check CMP/CBC  Asymptomatic PVCs No acute or new events. Continue to monitor.   Type 2 diabetes mellitus with stage 2 chronic kidney disease, without long-term current use of insulin [facility]) Education: Reviewed 'ABCs' of diabetes management  A1C (<7):  Goal Met. Blood pressure (<130/80):  Goal Met Cholesterol (LDL <70):  Goal Met Continue Eye Exam yearly  Continue Dental Exam Q6 mo Discussed dietary recommendations Discussed Physical Activity recommendations Foot exam UTD   CKD stage 2 due to type 2 diabetes mellitus (HCC) Discussed how what you eat and drink can aide in kidney protection. Stay well hydrated. Avoid high salt foods. Avoid NSAIDS. Keep BP and BG well controlled.   Take medications as prescribed. Remain active and exercise as tolerated daily. Maintain weight.  Continue to monitor. Check CMP/GFR/Microablumin  Hyperlipidemia associated with type 2 diabetes mellitus (HCC) Discussed lifestyle modifications. Recommended diet heavy in fruits and veggies, omega 3's. Decrease consumption of animal meats, cheeses, and dairy products. Remain active and exercise as tolerated. Continue to monitor. Check lipids/TSH   Hyperparathyroidism (HCC) Stable. Continue to monitor.   BPH with obstruction/lower urinary tract symptoms No new changes. Continue to monitor.  History of colonic polyps Last Colonoscopy 2016,  Due  Patient plans to reach out to office if wanting to schedule updated Colonoscopy. Evidence indicates that the net benefit of screening all persons in this age group is small. In determining whether this service is appropriate in individual cases, patients and clinicians should consider the patient's overall health, prior screening history, and  preferences.  Idiopathic gout, unspecified chronicity, unspecified site Continue medication. Last Uric Acid Level at goal. No new events Diet discussed; Low Purine Check uric acid  PRN  Vitamin D deficiency Vitamin D deficiency Continue supplement for goal of 60-100 Monitor Vitamin D levels  H/O prostate cancer (2006) No new changes. Follow up with GI PRN Needs colonoscopy Continue to monitor.  Obesity (BMI 30.0-34.9) Discussed appropriate BMI Diet modification. Physical activity. Encouraged/praised to build confidence.  Medication management All medications reviewed in full. All questions and concerns addressed.  Orders Placed This Encounter  Procedures   CBC with Differential/Platelet   COMPLETE METABOLIC PANEL WITH GFR   Lipid panel   Hemoglobin A1c   VITAMIN D 25 Hydroxy (Vit-D Deficiency, Fractures)    Notify office for further evaluation and treatment, questions or concerns if any reported s/s fail to improve.   The patient was advised to call back or seek an in-person evaluation if any symptoms worsen or if the condition fails to improve as anticipated.   Further disposition pending results of labs. Discussed med's effects and SE's.    I discussed the assessment and treatment plan with the patient. The patient was provided an opportunity to ask questions and all were answered. The patient agreed with the plan and demonstrated an understanding of the instructions.  Discussed med's effects and SE's. Screening labs and tests as requested with regular follow-up as recommended.  I provided 35 minutes of  face-to-face  time during this encounter including counseling, chart review, and critical decision making was preformed.  Today's Plan of Care is based on a patient-centered health care approach known as shared decision making - the decisions, tests and treatments allow for patient preferences and values to be balanced with clinical evidence.     Future Appointments  Date Time Provider Department Center  03/26/2023 11:00 AM Lucky Cowboy, MD [facility] None  12/21/2023 10:00 AM Adela Glimpse, NP [facility] None     Plan:   During the course of the visit the patient was educated and counseled about appropriate screening and preventive services including:   Pneumococcal vaccine  Influenza vaccine Prevnar 13 Td vaccine Screening electrocardiogram Colorectal cancer screening Diabetes screening Glaucoma screening Nutrition counseling    Subjective:  Marcus Chambers is a 82 y.o. male who presents for Medicare Annual Wellness Visit and 3 month follow up for HTN, hyperlipidemia, T2DM, and vitamin D Def.   He is not married, divorced.  He has no children.  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  Enjoys his job.  Stays active in church community.  He has no new concerns at this time.  He has hx of prostate cancer circa 2010, underwent radiation, was followed annually by [facility] until 2019 and now monitored at this office. PSAs have been stable, denies urinary sx.   he has a diagnosis of anxiety/insomnia formerly on alprazolam, was able to taper off, hasn't been filled since 07/2019.   BMI is Body mass index is 30.38 kg/m., he has been working on diet. Wt Readings from Last 3 Encounters:  12/20/22 236 lb 9.6 oz (107.3 kg)  09/18/22 235 lb 3.2 oz (106.7 kg)  06/19/22 237 lb (107.5 kg)   His blood pressure has been controlled at home (130/75), today their BP is BP: 136/70 He does workout. He denies chest pain, shortness of breath, dizziness.   He is on cholesterol medication (atorvastatin  20 mg daily) and denies myalgias. His cholesterol is at goal. The cholesterol last visit was:   Lab Results  Component Value Date   CHOL 148 09/18/2022   HDL 72 09/18/2022   LDLCALC 62 09/18/2022   TRIG 49 09/18/2022   CHOLHDL 2.1 09/18/2022   He has been working on diet and exercise for T2 diabetes controlled on metformin 2000 mg daily, and denies foot ulcerations, increased appetite, nausea, paresthesia of the feet, polydipsia, polyuria, visual disturbances, vomiting and weight loss. He checks sugars 2-3 days a week. Last A1C in the office was:  Lab Results  Component Value Date   HGBA1C 6.0 (H) 09/18/2022   He has CKD II associated with T2DM monitored at this office:  Lab Results  Component Value Date   GFRAA 75 11/26/2020    Patient is on Vitamin D supplement and at goal at recent check:   Lab Results  Component Value Date   VD25OH 104 (H) 09/18/2022     Patient is on allopurinol for gout and does not report a recent flare.  Lab Results  Component Value Date   LABURIC 3.5 (L) 03/07/2022     Medication Review:  Current Outpatient Medications (Endocrine & Metabolic):    metFORMIN (GLUCOPHAGE-XR) 500 MG 24 hr tablet, Take  2 tablets  2 x /day  with Meals for Diabetes                                                 /  TAKE                                        BY                                                  MOUTH  Current Outpatient Medications (Cardiovascular):    amLODipine (NORVASC) 5 MG tablet, Take 1 tablet Daily for BP                                                                     /              Take                   by                      mouth                             once daily   atorvastatin (LIPITOR) 20 MG tablet, Take  1 tablet  Daily  for Cholesterol                                                /                                    TAKE                                   BY                                 MOUTH                                       ONCE DAILY   bumetanide (BUMEX) 2 MG tablet, Take     1 tablet      2x  /day for BP and  Fluid Retention /Ankle Swelling  Current Outpatient Medications (Respiratory):    ipratropium (ATROVENT) 0.06 % nasal spray, Use 1 to 2 sprays each nostril 2 to 3 x /day as needed   mometasone (NASONEX) 50 MCG/ACT nasal spray, Place 1 spray into the nose 2 (two) times daily as needed. 1 spray into each nostril.   montelukast (SINGULAIR) 10 MG tablet, TAKE 1 TABLET BY MOUTH ONCE DAILY FOR ALLERGIES  Current Outpatient Medications (Analgesics):    allopurinol (ZYLOPRIM) 300 MG tablet, Take 1 tablet by mouth once daily   aspirin 81 MG tablet, Take 81 mg  by mouth daily.   Current Outpatient Medications (Other):    Ascorbic Acid (VITAMIN C) 1000 MG tablet, Take 1,000 mg by mouth daily.   blood glucose meter kit and supplies KIT, CHECK BLOOD SUGAR 1 TIME DAILY-E11.9   Cholecalciferol (VITAMIN D PO), Take 10,000 Units by mouth daily.   dicyclomine (BENTYL) 20 MG tablet, Take  1 tablet  4 x /day  before Meals & Bedtime  if needed for Nausea, Cramping or Diarrhea.   glucose blood (ONETOUCH ULTRA) test strip, USE 1 STRIP TO CHECK GLUCOSE ONCE DAILY   Lancets MISC, CHECK BLOOD SUGAR 1 TIME DAILY-E11.9   MAGNESIUM PO, Take 500 mg by mouth daily.   zinc gluconate 50 MG tablet, Take 50 mg by mouth daily.   hyoscyamine (LEVSIN SL) 0.125 MG SL tablet, Dissolve 1 tablet under tongue every 4 hours if needed for Nausea, Bloating, Cramping or Diarrhea. (Patient not taking: Reported on 12/20/2022)  Allergies: Allergies  Allergen Reactions   Ace Inhibitors     Current Problems (verified) has History of colonic polyps; Type 2 diabetes mellitus with stage 2 chronic kidney disease, without long-term current use of insulin (HCC); Hyperlipidemia associated with type 2 diabetes mellitus (HCC); Idiopathic gout; Vitamin D deficiency; Essential hypertension; Screening for  heart disease; H/O prostate cancer (2006); Obesity (BMI 30.0-34.9); Hypertensive retinopathy; CKD stage 2 due to type 2 diabetes mellitus (HCC); Situational anxiety ; ACEI/ARB contraindicated; Asymptomatic PVCs; FHx: heart disease; BPH with obstruction/lower urinary tract symptoms; Hyperparathyroidism (HCC); Screening for AAA (abdominal aortic aneurysm); Screening for colorectal cancer; and Hyperparathyroidism, secondary (HCC) on their problem list.  Screening Tests Immunization History  Administered Date(s) Administered   DT (Pediatric) 03/21/2012   Influenza, High Dose Seasonal PF 03/27/2019   Influenza-Unspecified 04/16/2014, 04/16/2015, 04/16/2018, 04/18/2021   Moderna Sars-Covid-2 Vaccination 08/07/2019, 09/03/2019, 05/22/2020, 10/27/2020   Pneumococcal Conjugate-13 05/19/2014   Pneumococcal Polysaccharide-23 07/24/2007    Preventative care: Last colonoscopy: 2016 due 07/2021 - Dr. Arlyce Dice.   Patient to reach out to office if wanting to schedule updated Colonoscopy. Evidence indicates that the net benefit of screening all persons in this age group is small. In determining whether this service is appropriate in individual cases, patients and clinicians should consider the patient's overall health, prior screening history, and  preferences.  Prior vaccinations: TD or Tdap: 2013  Influenza: 2023 Pneumococcal: 2009  Prevnar13: 2015 Shingles/Zostavax: declines, cost Covid 19: 2/2 2021  Names of Other Physician/Practitioners you currently use: 1. [facility] here for primary care 2. Dr. Earlene Plater in [place] [facility]), UTD:  2023 3. Dr. , dentist, overdue   Patient Care Team: Lucky Cowboy, MD as PCP - General (Internal Medicine) Louis Meckel, MD (Inactive) as Consulting Physician (Gastroenterology) Barron Alvine, MD (Inactive) as Consulting Physician (Urology) Luretha Murphy, MD as Consulting Physician (General Surgery) Charlett Nose, [facility]  (Inactive) as Pharmacist (Pharmacist)  Surgical: He  has a past surgical history that includes Appendectomy (1994); Cholecystectomy (1994); Knee surgery (1996); and colovesical fistula repair (01/2011). Family His family history is not on file. Social history  He reports that he quit smoking about 37 years ago. His smoking use included cigarettes. He has never used smokeless tobacco. He reports that he does not drink alcohol and does not use drugs.  MEDICARE WELLNESS OBJECTIVES: Physical activity:   Cardiac risk factors:   Depression/mood screen:      12/20/2022   12:34 PM  Depression screen PHQ 2/9  Decreased Interest 0  Down, Depressed, Hopeless 0  PHQ - 2 Score 0    ADLs:     12/20/2022   10:18 AM 09/18/2022    9:14 PM  In your present state of health, do you have any difficulty performing the following activities:  Hearing? 0 0  Vision? 0 0  Difficulty concentrating or making decisions? 0 0  Walking or climbing stairs? 0 0  Dressing or bathing? 0 0  Doing errands, shopping? 0 0     Cognitive Testing  Alert? Yes  Normal Appearance?Yes  Oriented to person? Yes  Place? Yes   Time? Yes  Recall of three objects?  Yes  Can perform simple calculations? Yes  Displays appropriate judgment?Yes  Can read the correct time from a watch face?Yes  EOL planning:     Objective:   Today's Vitals   12/20/22 0952  BP: 136/70  Pulse: (!) 57  Temp: 97.6 F (36.4 C)  SpO2: 98%  Weight: 236 lb 9.6 oz (107.3 kg)  Height: 6\' 2"  (1.88 m)    Body mass index is 30.38 kg/m.  General appearance: alert, no distress, WD/WN, obese male HEENT: normocephalic, sclerae anicteric, TMs pearly, nares patent, no discharge or erythema, pharynx normal Oral cavity: MMM, no lesions Neck: supple, no lymphadenopathy, no thyromegaly, no masses Heart: Rate regular, skipping every 2-3 beats, normal S1, S2, no murmurs Lungs: CTA bilaterally, no wheezes, rhonchi, or rales Abdomen: +bs, soft, pendulous,  non tender, non distended, no masses, no hepatomegaly, no splenomegaly Musculoskeletal: nontender, no swelling, no obvious deformity Extremities: no edema, no cyanosis, no clubbing. Toe nails long, curved, thickened.  Pulses: 2+ symmetric, upper and lower extremities, normal cap refill Neurological: alert, oriented x 3, CN2-12 intact, strength normal upper extremities and lower extremities, sensation normal throughout, DTRs 2+ throughout, no cerebellar signs, gait slow steady Psychiatric: normal affect, behavior normal, pleasant   EKG 02/2021: frequent PAC/PVC's, NSR, no a. Fib -   Medicare Attestation I have personally reviewed: The patient's medical and social history Their use of alcohol, tobacco or illicit drugs Their current medications and supplements The patient's functional ability including ADLs,fall risks, home safety risks, cognitive, and hearing and visual impairment Diet and physical activities Evidence for depression or mood disorders  The patient's weight, height, BMI, and visual acuity have been recorded in the chart.  I have made referrals, counseling, and provided education to the patient based on review of the above and I have provided the patient with a written personalized care plan for preventive services.     Adela Glimpse, NP   12/20/2022

## 2022-12-20 NOTE — Patient Instructions (Signed)

## 2022-12-21 LAB — LIPID PANEL
Cholesterol: 144 mg/dL (ref <200)
HDL: 78 mg/dL (ref >=40)
LDL Cholesterol (Calc): 56 mg/dL (calc)
Non-HDL Cholesterol (Calc): 66 mg/dL (calc) (ref <130)
Total CHOL/HDL Ratio: 1.8 (calc) (ref <5.0)
Triglycerides: 36 mg/dL (ref <150)

## 2022-12-21 LAB — VITAMIN D 25 HYDROXY (VIT D DEFICIENCY, FRACTURES): Vit D, 25-Hydroxy: 96 ng/mL (ref 30–100)

## 2022-12-21 LAB — CBC WITH DIFFERENTIAL/PLATELET
Absolute Monocytes: 406 cells/uL (ref 200–950)
Basophils Absolute: 57 cells/uL (ref 0–200)
Basophils Relative: 1.1 %
Eosinophils Absolute: 109 cells/uL (ref 15–500)
Eosinophils Relative: 2.1 %
HCT: 36.4 % — ABNORMAL LOW (ref 38.5–50.0)
Hemoglobin: 11.7 g/dL — ABNORMAL LOW (ref 13.2–17.1)
Lymphs Abs: 1092 cells/uL (ref 850–3900)
MCH: 27.1 pg (ref 27.0–33.0)
MCHC: 32.1 g/dL (ref 32.0–36.0)
MCV: 84.5 fL (ref 80.0–100.0)
MPV: 11 fL (ref 7.5–12.5)
Monocytes Relative: 7.8 %
Neutro Abs: 3536 cells/uL (ref 1500–7800)
Neutrophils Relative %: 68 %
Platelets: 232 10*3/uL (ref 140–400)
RBC: 4.31 10*6/uL (ref 4.20–5.80)
RDW: 13.8 % (ref 11.0–15.0)
Total Lymphocyte: 21 %
WBC: 5.2 10*3/uL (ref 3.8–10.8)

## 2022-12-21 LAB — COMPLETE METABOLIC PANEL WITHOUT GFR
AG Ratio: 1.2 (calc) (ref 1.0–2.5)
ALT: 15 U/L (ref 9–46)
AST: 17 U/L (ref 10–35)
Albumin: 4.1 g/dL (ref 3.6–5.1)
Alkaline phosphatase (APISO): 62 U/L (ref 35–144)
BUN: 16 mg/dL (ref 7–25)
CO2: 27 mmol/L (ref 20–32)
Calcium: 10.5 mg/dL — ABNORMAL HIGH (ref 8.6–10.3)
Chloride: 106 mmol/L (ref 98–110)
Creat: 0.88 mg/dL (ref 0.70–1.22)
Globulin: 3.3 g/dL (ref 1.9–3.7)
Glucose, Bld: 94 mg/dL (ref 65–99)
Potassium: 4.1 mmol/L (ref 3.5–5.3)
Sodium: 140 mmol/L (ref 135–146)
Total Bilirubin: 0.6 mg/dL (ref 0.2–1.2)
Total Protein: 7.4 g/dL (ref 6.1–8.1)
eGFR: 86 mL/min/1.73m2 (ref >=60)

## 2022-12-21 LAB — HEMOGLOBIN A1C
Hgb A1c MFr Bld: 5.7 % of total Hgb — ABNORMAL HIGH (ref <5.7)
Mean Plasma Glucose: 117 mg/dL
eAG (mmol/L): 6.5 mmol/L

## 2022-12-25 NOTE — Progress Notes (Signed)
Patient is aware of lab results and instructions. -e welch

## 2023-01-10 ENCOUNTER — Other Ambulatory Visit: Payer: Self-pay | Admitting: Nurse Practitioner

## 2023-01-10 DIAGNOSIS — J302 Other seasonal allergic rhinitis: Secondary | ICD-10-CM

## 2023-03-12 ENCOUNTER — Encounter: Payer: Medicare HMO | Admitting: Internal Medicine

## 2023-03-25 ENCOUNTER — Encounter: Payer: Self-pay | Admitting: Internal Medicine

## 2023-03-25 NOTE — Patient Instructions (Signed)

## 2023-03-25 NOTE — Progress Notes (Unsigned)
Annual  Screening/Preventative Visit  & Comprehensive Evaluation & Examination   Future Appointments  Date Time Provider Department  03/26/2023                      cpe 11:00 AM Lucky Cowboy, MD Kathalene Frames  [facility]  04/02/2024                     cpe 11:00 AM Lucky Cowboy, MD [facility]            This very nice 82 y.o. DBM  with HTN, HLD, T2_NIDDM  and Vitamin D Deficiency presents for a Screening /Preventative Visit & comprehensive evaluation and management of multiple medical co-morbidities.  Patient has hx/o Gout is controlled on Allopurinol.  Patient has remote hx/o Prostate Ca treated by radiation & in remission.         HTN predates since 1989. Patient's BP has been controlled at home.  Today's BP was initially elevated & rechecked at goal - 136/80 .  In 2008 , he had a negative heart cath after a False (+) Cardiolite. Patient denies any cardiac symptoms as chest pain, palpitations, shortness of breath, dizziness or ankle swelling.        Patient's hyperlipidemia is controlled with diet and Atorvastatin. Patient denies myalgias or other medication SE's. Lipids are at goal :  Lab Results  Component Value Date   CHOL 144 12/20/2022   HDL 78 12/20/2022   LDLCALC 56 12/20/2022   TRIG 36 12/20/2022   CHOLHDL 1.8 12/20/2022         Patient has Moderate Obesity (BMI 34+) and T2_NIDDM (2005) with CKD2  (GFR 75) and patient denies reactive hypoglycemic symptoms, visual blurring, diabetic polys or paresthesias. A1c is at  goal :   Lab Results  Component Value Date   HGBA1C 5.7 (H) 12/20/2022         Finally, patient has history of Vitamin D Deficiency ("29" /2008) and last vitamin D was at goal :  Lab Results  Component Value Date   VD25OH 96 12/20/2022       Outpatient Medications   Medication Sig   allopurinol 300 MG tablet TAKE 1 TABLET DAILY    amLODipine  5 MG tablet Take 1 tablet  Daily    VITAMIN C 1000 MG tablet Take  daily.   aspirin 81 MG  tablet Take daily.   atorvastatin  20 MG tablet TAKE 1 TABLET  DAILY    bumetanide 2 MG tablet Take     1 tablet      2x  /day   VITAMIN D  Take 10,000 Units daily   ipratropium  0.06 % nasal spray Use 1-2 sprays each nostril 2-3 x /day    MAGNESIUM   500 mg  Take  2  times daily.    metFORMIN-XR) 500 MG  TAKE 2 TABLETS TWICE DAILY    NASONEX  nasal spray Place 1 spray into  nose 2  times daily    montelukast  10 MG tablet TAKE 1 TABLET  DAILY    zinc  50 MG tablet Take daily.     Allergies  Allergen Reactions   Ace Inhibitors      Past Medical History:  Diagnosis Date   Adenomatous colon polyp    2010   Gout    Hyperlipidemia    Hypertension    Prostate cancer (HCC)  treated by external radiation 2010   Type  II  diabetes mellitus     Vitamin D deficiency      Health Maintenance  Topic Date Due   Zoster Vaccines- Shingrix (1 of 2) Never done   OPHTHALMOLOGY EXAM  06/30/2020   FOOT EXAM  01/11/2021   URINE MICROALBUMIN  01/12/2021   COVID-19 Vaccine 5 - Booster  02/26/2021   INFLUENZA VACCINE  02/14/2021   HEMOGLOBIN A1C  05/29/2021   COLONOSCOPY  07/21/2021   TETANUS/TDAP  03/21/2022   Hepatitis C Screening  Completed   PNA vac Low Risk Adult  Completed   HPV VACCINES  Aged Out     Immunization History  Administered Date(s) Administered   DT  03/21/2012   Influenza, High Dose  03/27/2019   Influenza 04/16/2014, 04/16/2015, 04/16/2018   Moderna Sars-Covid-2 Vacc 08/07/2019, 09/03/2019, 05/22/2020, 10/27/2020   Pneumococcal -  13 05/19/2014   Pneumococcal -  23 07/24/2007    Last Colon -  07/21/2014 - Dr Arlyce Dice - recommended 7 yr f/u - due Jan 2023 - overdue  Past Surgical History:  Procedure Laterality Date   APPENDECTOMY  1994   and GALLBLADDER   CHOLECYSTECTOMY  1994   colovesical fistula repair  01/2011   KNEE SURGERY  1996     Family History  Problem Relation Age of Onset   Colon cancer Neg Hx    Esophageal cancer Neg Hx    Rectal cancer  Neg Hx    Stomach cancer Neg Hx     Social History   Socioeconomic History   Marital status: Divorced  Occupational History   Retired  Tobacco Use   Smoking status: Former    Types: Cigarettes    Quit date: 07/17/1985    Years since quitting: 35.6   Smokeless tobacco: Never  Substance and Sexual Activity   Alcohol use: No    Alcohol/week: 0.0 standard drinks   Drug use: No   Sexual activity: Not on file     ROS Constitutional: Denies fever, chills, weight loss/gain, headaches, insomnia,  night sweats or change in appetite. Does c/o fatigue. Eyes: Denies redness, blurred vision, diplopia, discharge, itchy or watery eyes.  ENT: Denies discharge, congestion, post nasal drip, epistaxis, sore throat, earache, hearing loss, dental pain, Tinnitus, Vertigo, Sinus pain or snoring.  Cardio: Denies chest pain, palpitations, irregular heartbeat, syncope, dyspnea, diaphoresis, orthopnea, PND, claudication or edema Respiratory: denies cough, dyspnea, DOE, pleurisy, hoarseness, laryngitis or wheezing.  Gastrointestinal: Denies dysphagia, heartburn, reflux, water brash, pain, cramps, nausea, vomiting, bloating, diarrhea, constipation, hematemesis, melena, hematochezia, jaundice or hemorrhoids Genitourinary: Denies dysuria, frequency,  discharge, hematuria or flank pain. Has urgency, nocturia x 2-3 & occasional hesitancy. Musculoskeletal: Denies arthralgia, myalgia, stiffness, Jt. Swelling, pain, limp or strain/sprain. Denies Falls. Skin: Denies puritis, rash, hives, warts, acne, eczema or change in skin lesion Neuro: No weakness, tremor, incoordination, spasms, paresthesia or pain Psychiatric: Denies confusion, memory loss or sensory loss. Denies Depression. Endocrine: Denies change in weight, skin, hair change, nocturia, and paresthesia, diabetic polys, visual blurring or hyper / hypo glycemic episodes.  Heme/Lymph: No excessive bleeding, bruising or enlarged lymph nodes.   Physical Exam  BP  138/80   Pulse 67   Temp 97.9 F (36.6 C)   Resp 17   Ht 6\' 2"  (1.88 m)   Wt 227 lb 12.8 oz (103.3 kg)   SpO2 99%   BMI 29.25 kg/m   General Appearance: Over nourished and in no apparent distress.  Eyes: PERRLA, EOMs, conjunctiva no swelling or erythema, normal fundi  and vessels. Sinuses: No frontal/maxillary tenderness ENT/Mouth: EACs patent / TMs  nl. Nares clear without erythema, swelling, mucoid exudates. Oral hygiene is good. No erythema, swelling, or exudate. Tongue normal, non-obstructing. Tonsils not swollen or erythematous. Hearing normal.  Neck: Supple, thyroid not palpable. No bruits, nodes or JVD. Respiratory: Respiratory effort normal.  BS equal and clear bilateral without rales, rhonci, wheezing or stridor. Cardio: Heart sounds are normal with regular rate and rhythm and no murmurs, rubs or gallops. Peripheral pulses are normal and equal bilaterally without edema. No aortic or femoral bruits. Chest: symmetric with normal excursions and percussion.  Abdomen: Soft, with Nl bowel sounds. Nontender, no guarding, rebound, hernias, masses, or organomegaly.  Lymphatics: Non tender without lymphadenopathy.  Musculoskeletal: Full ROM all peripheral extremities, joint stability, 5/5 strength, and normal gait. Skin: Warm and dry without rashes, lesions, cyanosis, clubbing or  ecchymosis.  Neuro: Cranial nerves intact, reflexes equal bilaterally. Normal muscle tone, no cerebellar symptoms. Sensation intact.  Pysch: Alert and oriented X 3 with normal affect, insight and judgment appropriate.   Assessment and Plan  1. Annual Preventative/Screening Exam    2. Essential hypertension  - EKG 12-Lead - [place], RETROPERITNL ABD,  LTD - Urinalysis, Routine w reflex microscopic - Microalbumin / creatinine urine ratio - CBC with Differential/Platelet - COMPLETE METABOLIC PANEL WITH GFR - Magnesium - TSH   3. Hyperlipidemia associated with type 2 diabetes mellitus (HCC)  - EKG  12-Lead - [place], RETROPERITNL ABD,  LTD - Lipid panel - TSH   4. Type 2 diabetes mellitus with stage 2 chronic kidney  disease, without long-term current use of insulin (HCC)  - EKG 12-Lead - [place], RETROPERITNL ABD,  LTD - HM DIABETES FOOT EXAM - LOW EXTREMITY NEUR EXAM DOCUM - Hemoglobin A1c - Insulin, random   5. Vitamin D deficiency  - VITAMIN D 25 Hydroxy    6. Idiopathic gout  - Uric acid   7. BPH with obstruction/lower urinary tract symptoms  - PSA   8. H/O prostate cancer (2006)  - PSA   9. Screening for colorectal cancer  - POC Hemoccult Bld/Stl   10. Prostate cancer screening  - PSA   11. Screening for ischemic heart disease  - EKG 12-Lead   12. FHx: heart disease  - EKG 12-Lead - [place], RETROPERITNL ABD,  LTD   13. Former smoker  - EKG 12-Lead - [place], RETROPERITNL ABD,  LTD   14. Screening for AAA (aortic abdominal aneurysm)  - [place], RETROPERITNL ABD,  LTD   15. Medication management - Urinalysis, Routine w reflex microscopic - Microalbumin / creatinine urine ratio - Uric acid - CBC with Differential/Platelet - COMPLETE METABOLIC PANEL WITH GFR - Magnesium - Lipid panel - TSH - Hemoglobin A1c - Insulin, random         Patient was counseled in prudent diet, weight control to achieve/maintain BMI less than 25, BP monitoring, regular exercise and medications as discussed.  Discussed med effects and SE's. Routine screening labs and tests as requested with regular follow-up as recommended. Over 40 minutes of exam, counseling, chart review and high complex critical decision making was performed   Marinus Maw, MD

## 2023-03-26 ENCOUNTER — Ambulatory Visit (INDEPENDENT_AMBULATORY_CARE_PROVIDER_SITE_OTHER): Payer: Medicare HMO | Admitting: Internal Medicine

## 2023-03-26 ENCOUNTER — Encounter: Payer: Self-pay | Admitting: Internal Medicine

## 2023-03-26 VITALS — BP 138/80 | HR 67 | Temp 97.9°F | Resp 17 | Ht 74.0 in | Wt 227.8 lb

## 2023-03-26 DIAGNOSIS — Z79899 Other long term (current) drug therapy: Secondary | ICD-10-CM

## 2023-03-26 DIAGNOSIS — N182 Chronic kidney disease, stage 2 (mild): Secondary | ICD-10-CM | POA: Diagnosis not present

## 2023-03-26 DIAGNOSIS — Z8249 Family history of ischemic heart disease and other diseases of the circulatory system: Secondary | ICD-10-CM

## 2023-03-26 DIAGNOSIS — E1122 Type 2 diabetes mellitus with diabetic chronic kidney disease: Secondary | ICD-10-CM | POA: Diagnosis not present

## 2023-03-26 DIAGNOSIS — E1169 Type 2 diabetes mellitus with other specified complication: Secondary | ICD-10-CM | POA: Diagnosis not present

## 2023-03-26 DIAGNOSIS — Z125 Encounter for screening for malignant neoplasm of prostate: Secondary | ICD-10-CM

## 2023-03-26 DIAGNOSIS — Z0000 Encounter for general adult medical examination without abnormal findings: Secondary | ICD-10-CM | POA: Diagnosis not present

## 2023-03-26 DIAGNOSIS — Z1211 Encounter for screening for malignant neoplasm of colon: Secondary | ICD-10-CM

## 2023-03-26 DIAGNOSIS — M1000 Idiopathic gout, unspecified site: Secondary | ICD-10-CM

## 2023-03-26 DIAGNOSIS — Z8546 Personal history of malignant neoplasm of prostate: Secondary | ICD-10-CM

## 2023-03-26 DIAGNOSIS — I10 Essential (primary) hypertension: Secondary | ICD-10-CM

## 2023-03-26 DIAGNOSIS — E559 Vitamin D deficiency, unspecified: Secondary | ICD-10-CM | POA: Diagnosis not present

## 2023-03-26 DIAGNOSIS — I700 Atherosclerosis of aorta: Secondary | ICD-10-CM

## 2023-03-26 DIAGNOSIS — Z0001 Encounter for general adult medical examination with abnormal findings: Secondary | ICD-10-CM

## 2023-03-26 DIAGNOSIS — Z87891 Personal history of nicotine dependence: Secondary | ICD-10-CM | POA: Diagnosis not present

## 2023-03-26 DIAGNOSIS — Z136 Encounter for screening for cardiovascular disorders: Secondary | ICD-10-CM | POA: Diagnosis not present

## 2023-03-26 DIAGNOSIS — E785 Hyperlipidemia, unspecified: Secondary | ICD-10-CM | POA: Diagnosis not present

## 2023-03-27 LAB — CBC WITH DIFFERENTIAL/PLATELET
Absolute Monocytes: 428 {cells}/uL (ref 200–950)
Basophils Absolute: 59 {cells}/uL (ref 0–200)
Basophils Relative: 1.3 %
Eosinophils Absolute: 81 {cells}/uL (ref 15–500)
Eosinophils Relative: 1.8 %
HCT: 36.2 % — ABNORMAL LOW (ref 38.5–50.0)
Hemoglobin: 11.9 g/dL — ABNORMAL LOW (ref 13.2–17.1)
Lymphs Abs: 1121 {cells}/uL (ref 850–3900)
MCH: 27.7 pg (ref 27.0–33.0)
MCHC: 32.9 g/dL (ref 32.0–36.0)
MCV: 84.4 fL (ref 80.0–100.0)
MPV: 11.3 fL (ref 7.5–12.5)
Monocytes Relative: 9.5 %
Neutro Abs: 2813 {cells}/uL (ref 1500–7800)
Neutrophils Relative %: 62.5 %
Platelets: 242 10*3/uL (ref 140–400)
RBC: 4.29 10*6/uL (ref 4.20–5.80)
RDW: 14.7 % (ref 11.0–15.0)
Total Lymphocyte: 24.9 %
WBC: 4.5 10*3/uL (ref 3.8–10.8)

## 2023-03-27 LAB — COMPLETE METABOLIC PANEL WITH GFR
AG Ratio: 1.3 (calc) (ref 1.0–2.5)
ALT: 15 U/L (ref 9–46)
AST: 17 U/L (ref 10–35)
Albumin: 4.2 g/dL (ref 3.6–5.1)
Alkaline phosphatase (APISO): 63 U/L (ref 35–144)
BUN: 15 mg/dL (ref 7–25)
CO2: 25 mmol/L (ref 20–32)
Calcium: 10.2 mg/dL (ref 8.6–10.3)
Chloride: 104 mmol/L (ref 98–110)
Creat: 0.94 mg/dL (ref 0.70–1.22)
Globulin: 3.2 g/dL (ref 1.9–3.7)
Glucose, Bld: 95 mg/dL (ref 65–99)
Potassium: 4 mmol/L (ref 3.5–5.3)
Sodium: 138 mmol/L (ref 135–146)
Total Bilirubin: 0.7 mg/dL (ref 0.2–1.2)
Total Protein: 7.4 g/dL (ref 6.1–8.1)
eGFR: 81 mL/min/{1.73_m2} (ref >=60)

## 2023-03-27 LAB — HEMOGLOBIN A1C
Hgb A1c MFr Bld: 5.9 %{Hb} — ABNORMAL HIGH (ref <5.7)
Mean Plasma Glucose: 123 mg/dL
eAG (mmol/L): 6.8 mmol/L

## 2023-03-27 LAB — URINALYSIS, ROUTINE W REFLEX MICROSCOPIC
Bilirubin Urine: NEGATIVE
Glucose, UA: NEGATIVE
Hgb urine dipstick: NEGATIVE
Ketones, ur: NEGATIVE
Leukocytes,Ua: NEGATIVE
Nitrite: NEGATIVE
Protein, ur: NEGATIVE
Specific Gravity, Urine: 1.008 (ref 1.001–1.035)
pH: 7.5 (ref 5.0–8.0)

## 2023-03-27 LAB — MICROALBUMIN / CREATININE URINE RATIO
Creatinine, Urine: 37 mg/dL (ref 20–320)
Microalb Creat Ratio: 27 mg/g{creat} (ref <30)
Microalb, Ur: 1 mg/dL

## 2023-03-27 LAB — LIPID PANEL
Cholesterol: 156 mg/dL (ref <200)
HDL: 75 mg/dL (ref >=40)
LDL Cholesterol (Calc): 68 mg/dL
Non-HDL Cholesterol (Calc): 81 mg/dL (ref <130)
Total CHOL/HDL Ratio: 2.1 (calc) (ref <5.0)
Triglycerides: 47 mg/dL (ref <150)

## 2023-03-27 LAB — PSA: PSA: 0.28 ng/mL (ref <=4.00)

## 2023-03-27 LAB — URIC ACID: Uric Acid, Serum: 5 mg/dL (ref 4.0–8.0)

## 2023-03-27 LAB — VITAMIN D 25 HYDROXY (VIT D DEFICIENCY, FRACTURES): Vit D, 25-Hydroxy: 102 ng/mL — ABNORMAL HIGH (ref 30–100)

## 2023-03-27 LAB — INSULIN, RANDOM: Insulin: 9.9 u[IU]/mL

## 2023-03-27 LAB — TSH: TSH: 1.14 m[IU]/L (ref 0.40–4.50)

## 2023-03-27 LAB — MAGNESIUM: Magnesium: 1.8 mg/dL (ref 1.5–2.5)

## 2023-03-27 NOTE — Progress Notes (Signed)
<>*<>*<>*<>*<>*<>*<>*<>*<>*<>*<>*<>*<>*<>*<>*<>*<>*<>*<>*<>*<>*<>*<>*<>*<> <>*<>*<>*<>*<>*<>*<>*<>*<>*<>*<>*<>*<>*<>*<>*<>*<>*<>*<>*<>*<>*<>*<>*<>*<>  - Still very mild anemia - which has been stable for years                                                                                     - will continue to monitor closely  <>*<>*<>*<>*<>*<>*<>*<>*<>*<>*<>*<>*<>*<>*<>*<>*<>*<>*<>*<>*<>*<>*<>*<>*<> <>*<>*<>*<>*<>*<>*<>*<>*<>*<>*<>*<>*<>*<>*<>*<>*<>*<>*<>*<>*<>*<>*<>*<>*<>  -  A1c = 5.9% - Still borderline  elevated    -  It is very important that you work harder with diet by                  avoiding all foods that are white except chicken, fish & calliflower.  - Avoid white rice  (brown & wild rice is OK),   - Avoid white potatoes  (sweet potatoes in moderation is OK),   White bread or wheat bread or anything made out of                                 white flour like bagels, donuts, rolls, buns, biscuits, cakes,  - pastries, cookies, pizza crust, and pasta (made from white flour & egg whites)   - vegetarian pasta or spinach or wheat pasta is OK.  - Multigrain breads like Arnold's, Pepperidge Farm or                                                    multigrain sandwich thins or high fiber breads like   E[place]ka bread or "Dave's Killer" breads that are  4 to 5 grams fiber per slice !  are best.    Diet, exercise and weight loss can reverse and cure diabetes in the early stages.    <>*<>*<>*<>*<>*<>*<>*<>*<>*<>*<>*<>*<>*<>*<>*<>*<>*<>*<>*<>*<>*<>*<>*<>*<> <>*<>*<>*<>*<>*<>*<>*<>*<>*<>*<>*<>*<>*<>*<>*<>*<>*<>*<>*<>*<>*<>*<>*<>*<>  -  Vitamin D= 102  - > Excellent !      Please keep dose same   <>*<>*<>*<>*<>*<>*<>*<>*<>*<>*<>*<>*<>*<>*<>*<>*<>*<>*<>*<>*<>*<>*<>*<>*<> <>*<>*<>*<>*<>*<>*<>*<>*<>*<>*<>*<>*<>*<>*<>*<>*<>*<>*<>*<>*<>*<>*<>*<>*<>  - PSA - Very low - No Prostate Cancer -    Great    !  <>*<>*<>*<>*<>*<>*<>*<>*<>*<>*<>*<>*<>*<>*<>*<>*<>*<>*<>*<>*<>*<>*<>*<>*<> <>*<>*<>*<>*<>*<>*<>*<>*<>*<>*<>*<>*<>*<>*<>*<>*<>*<>*<>*<>*<>*<>*<>*<>*<>  - Uric Acid / Gout Test is Normal - Please continue Allopurinol   <>*<>*<>*<>*<>*<>*<>*<>*<>*<>*<>*<>*<>*<>*<>*<>*<>*<>*<>*<>*<>*<>*<>*<>*<> <>*<>*<>*<>*<>*<>*<>*<>*<>*<>*<>*<>*<>*<>*<>*<>*<>*<>*<>*<>*<>*<>*<>*<>*<>  -  Magnesium =  1.8   is   very  low- goal is betw 2.0 - 2.5,   - So..............Marland Kitchen  Recommend that you INCREASE your                                                     Magnesium 500 mg tablet daily  To take 2 to 3 tablets /day !  - also important to eat lots of  leafy green vegetables                                                        - spinach - Kale - collards - greens   - okra - asparagus - broccoli  - quinoa - squash - almonds   - black, red, white beans-  peas - green beans  <>*<>*<>*<>*<>*<>*<>*<>*<>*<>*<>*<>*<>*<>*<>*<>*<>*<>*<>*<>*<>*<>*<>*<>*<> <>*<>*<>*<>*<>*<>*<>*<>*<>*<>*<>*<>*<>*<>*<>*<>*<>*<>*<>*<>*<>*<>*<>*<>*<>  - Chol = 156 &  LDL = 68   -   Both  Excellent  !  - Very low risk for Heart Attack  / Stroke  <>*<>*<>*<>*<>*<>*<>*<>*<>*<>*<>*<>*<>*<>*<>*<>*<>*<>*<>*<>*<>*<>*<>*<>*<> <>*<>*<>*<>*<>*<>*<>*<>*<>*<>*<>*<>*<>*<>*<>*<>*<>*<>*<>*<>*<>*<>*<>*<>*<>  - All Else - CBC - Kidneys - Electrolytes - Liver - Magnesium & Thyroid    - all  Normal / OK   <>*<>*<>*<>*<>*<>*<>*<>*<>*<>*<>*<>*<>*<>*<>*<>*<>*<>*<>*<>*<>*<>*<>*<>*<> <>*<>*<>*<>*<>*<>*<>*<>*<>*<>*<>*<>*<>*<>*<>*<>*<>*<>*<>*<>*<>*<>*<>*<>*<>

## 2023-04-05 ENCOUNTER — Other Ambulatory Visit: Payer: Self-pay | Admitting: Internal Medicine

## 2023-04-05 ENCOUNTER — Other Ambulatory Visit: Payer: Self-pay | Admitting: Nurse Practitioner

## 2023-04-05 DIAGNOSIS — J302 Other seasonal allergic rhinitis: Secondary | ICD-10-CM

## 2023-04-05 DIAGNOSIS — I10 Essential (primary) hypertension: Secondary | ICD-10-CM

## 2023-05-21 ENCOUNTER — Other Ambulatory Visit: Payer: Self-pay | Admitting: Internal Medicine

## 2023-05-23 ENCOUNTER — Other Ambulatory Visit: Payer: Self-pay | Admitting: Internal Medicine

## 2023-05-23 DIAGNOSIS — E1169 Type 2 diabetes mellitus with other specified complication: Secondary | ICD-10-CM

## 2023-06-22 ENCOUNTER — Other Ambulatory Visit: Payer: Self-pay | Admitting: Nurse Practitioner

## 2023-06-22 DIAGNOSIS — J302 Other seasonal allergic rhinitis: Secondary | ICD-10-CM

## 2023-06-22 DIAGNOSIS — I10 Essential (primary) hypertension: Secondary | ICD-10-CM

## 2023-07-25 NOTE — Progress Notes (Deleted)
 MEDICARE ANNUAL WELLNESS VISIT AND FOLLOW UP Assessment:   Encounter for Medicare annual wellness exam Due Annually  Health maintenance reviewed Healthy lifestyle goals set  Essential hypertension Well controlled. Discussed DASH (Dietary Approaches to Stop Hypertension) DASH diet is lower in sodium than a typical American diet. Cut back on foods that are high in saturated fat, cholesterol, and trans fats. Eat more whole-grain foods, fish, poultry, and nuts Remain active and exercise as tolerated daily.  Monitor BP at home-Call if greater than 130/80.  Check CMP/CBC  Asymptomatic PVCs No acute or new events. Continue to monitor.   Type 2 diabetes mellitus with stage 2 chronic kidney disease, without long-term current use of insulin  Marcus Chambers) Education: Reviewed 'ABCs' of diabetes management  A1C (<7):  Goal Met. Blood pressure (<130/80):  Goal Met Cholesterol (LDL <70):  Goal Met Continue Eye Exam yearly  Continue Dental Exam Q6 mo Discussed dietary recommendations Discussed Physical Activity recommendations Foot exam UTD   CKD stage 2 due to type 2 diabetes mellitus (HCC) Discussed how what you eat and drink can aide in kidney protection. Stay well hydrated. Avoid high salt foods. Avoid NSAIDS. Keep BP and BG well controlled.   Take medications as prescribed. Remain active and exercise as tolerated daily. Maintain weight.  Continue to monitor. Check CMP/GFR/Microablumin  Hyperlipidemia associated with type 2 diabetes mellitus (HCC) Discussed lifestyle modifications. Recommended diet heavy in fruits and veggies, omega 3's. Decrease consumption of animal meats, cheeses, and dairy products. Remain active and exercise as tolerated. Continue to monitor. Check lipids/TSH   Hyperparathyroidism (HCC) Stable. Continue to monitor.   BPH with obstruction/lower urinary tract symptoms No new changes. Continue to monitor.  History of colonic polyps Past due as of  07/21/21 Last 07/21/2014 with 7 year recall  Idiopathic gout, unspecified chronicity, unspecified site Continue medication. Last Uric Acid Level at goal. No new events Diet discussed; Low Purine Check uric acid  PRN  Vitamin D  deficiency Continue supplement for goal of 60-100 Monitor Vitamin D  levels  H/O prostate cancer (2006) No new changes. Follow up with GI PRN Needs colonoscopy Continue to monitor.  Obesity (BMI 30.0-34.9) Discussed appropriate BMI Diet modification. Physical activity. Encouraged/praised to build confidence.  Medication management All medications reviewed in full. All questions and concerns addressed.    Notify office for further evaluation and treatment, questions or concerns if any reported s/s fail to improve.   The patient was advised to call back or seek an in-person evaluation if any symptoms worsen or if the condition fails to improve as anticipated.   Further disposition pending results of labs. Discussed med's effects and SE's.    I discussed the assessment and treatment plan with the patient. The patient was provided an opportunity to ask questions and all were answered. The patient agreed with the plan and demonstrated an understanding of the instructions.  Discussed med's effects and SE's. Screening labs and tests as requested with regular follow-up as recommended.  I provided 35 minutes of face-to-face time during this encounter including counseling, chart review, and critical decision making was preformed.  Today's Plan of Care is based on a patient-centered health care approach known as shared decision making - the decisions, tests and treatments allow for patient preferences and values to be balanced with clinical evidence.     Future Appointments  Date Time Provider Department [facility]  07/26/2023 10:00 AM Marcus President, NP [facility] None  10/24/2023 10:30 AM Marcus Fallow, MD [facility] None  04/02/2024 11:00 AM Marcus Fallow,  MD [facility] None  07/25/2024 10:30 AM Marcus President, NP [facility] None     Plan:   During the course of the visit the patient was educated and counseled about appropriate screening and preventive services including:   Pneumococcal vaccine  Influenza vaccine Prevnar 13 Td vaccine Screening electrocardiogram Colorectal cancer screening Diabetes screening Glaucoma screening Nutrition counseling    Subjective:  Marcus Chambers is a 83 y.o. male who presents for Medicare Annual Wellness Visit and 3 month follow up for HTN, hyperlipidemia, T2DM, and vitamin D  Def.   He is not married, divorced.  He has no children.  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  Enjoys his job.  Stays active in church community.  He has no new concerns at this time.  He has hx of prostate cancer circa 2010, underwent radiation, was followed annually by [facility] until 2019 and now monitored at this office. PSAs have been stable, denies urinary sx.   he has a diagnosis of anxiety/insomnia formerly on alprazolam , was able to taper off, hasn't been filled since 07/2019.   BMI is There is no height or weight on file to calculate BMI., he has been working on diet. Wt Readings from Last 3 Encounters:  03/26/23 227 lb 12.8 oz (103.3 kg)  12/20/22 236 lb 9.6 oz (107.3 kg)  09/18/22 235 lb 3.2 oz (106.7 kg)   His blood pressure has been controlled at home (130/75), today their BP is   He does workout. He denies chest pain, shortness of breath, dizziness.   He is on cholesterol medication (atorvastatin  20 mg daily) and denies myalgias. His cholesterol is at goal. The cholesterol last visit was:   Lab Results  Component Value Date   CHOL 156 03/26/2023   HDL 75 03/26/2023   LDLCALC 68 03/26/2023   TRIG 47 03/26/2023   CHOLHDL 2.1 03/26/2023   He has been working on diet and exercise for T2 diabetes controlled on metformin  2000 mg daily, and denies foot ulcerations, increased appetite, nausea,  paresthesia of the feet, polydipsia, polyuria, visual disturbances, vomiting and weight loss. He checks sugars 2-3 days a week. Last A1C in the office was:  Lab Results  Component Value Date   HGBA1C 5.9 (H) 03/26/2023   He has CKD II associated with T2DM monitored at this office:  Lab Results  Component Value Date   GFRAA 75 11/26/2020    Patient is on Vitamin D  supplement and at goal at recent check:   Lab Results  Component Value Date   VD25OH 102 (H) 03/26/2023     Patient is on allopurinol for gout and does not report a recent flare.  Lab Results  Component Value Date   LABURIC 5.0 03/26/2023     Medication Review:  Current Outpatient Medications (Endocrine & Metabolic):    metFORMIN  (GLUCOPHAGE -XR) 500 MG 24 hr tablet, Take  2 tablets  2 x /day  with Meals for Diabetes                                                 /  TAKE                                        BY                                                  MOUTH  Current Outpatient Medications (Cardiovascular):    amLODipine  (NORVASC ) 5 MG tablet, Take 1 tablet by mouth once daily for blood pressure   atorvastatin  (LIPITOR) 20 MG tablet, TAKE 1 TABLET BY MOUTH ONCE DAILY FOR CHOLESTEROL   bumetanide  (BUMEX ) 2 MG tablet, Take     1 tablet      2x  /day for BP and  Fluid Retention /Ankle Swelling  Current Outpatient Medications (Respiratory):    ipratropium (ATROVENT ) 0.06 % nasal spray, USE 1 TO 2 SPRAY(S) IN EACH NOSTRIL 2-3  TIMES DAILY AS NEEDED   mometasone  (NASONEX ) 50 MCG/ACT nasal spray, Place 1 spray into the nose 2 (two) times daily as needed. 1 spray into each nostril.   montelukast  (SINGULAIR ) 10 MG tablet, TAKE 1 TABLET BY MOUTH ONCE DAILY FOR ALLERGIES  Current Outpatient Medications (Analgesics):    allopurinol (ZYLOPRIM) 300 MG tablet, Take 1 tablet by mouth once daily   aspirin 81 MG tablet, Take 81 mg by mouth daily.   Current Outpatient  Medications (Other):    Ascorbic Acid (VITAMIN C) 1000 MG tablet, Take 1,000 mg by mouth daily.   blood glucose meter kit and supplies KIT, CHECK BLOOD SUGAR 1 TIME DAILY-E11.9   Cholecalciferol (VITAMIN D  PO), Take 10,000 Units by mouth daily.   dicyclomine  (BENTYL ) 20 MG tablet, Take  1 tablet  4 x /day  before Meals & Bedtime  if needed for Nausea, Cramping or Diarrhea.   glucose blood (ONETOUCH ULTRA) test strip, USE 1 STRIP TO CHECK GLUCOSE ONCE DAILY   hyoscyamine  (LEVSIN  SL) 0.125 MG SL tablet, Dissolve 1 tablet under tongue every 4 hours if needed for Nausea, Bloating, Cramping or Diarrhea.   Lancets MISC, CHECK BLOOD SUGAR 1 TIME DAILY-E11.9   MAGNESIUM PO, Take 500 mg by mouth daily.   zinc gluconate 50 MG tablet, Take 50 mg by mouth daily.  Allergies: Allergies  Allergen Reactions   Ace Inhibitors     Current Problems (verified) has History of colonic polyps; Type 2 diabetes mellitus with stage 2 chronic kidney disease, without long-term current use of insulin  (HCC); Hyperlipidemia associated with type 2 diabetes mellitus (HCC); Idiopathic gout; Vitamin D  deficiency; Essential hypertension; Screening for heart disease; H/O prostate cancer (2006); Obesity (BMI 30.0-34.9); Hypertensive retinopathy; CKD stage 2 due to type 2 diabetes mellitus (HCC); Situational anxiety ; ACEI/ARB contraindicated; Asymptomatic PVCs; FHx: heart disease; BPH with obstruction/lower urinary tract symptoms; Hyperparathyroidism (HCC); Screening for AAA (abdominal aortic aneurysm); Screening for colorectal cancer; and Hyperparathyroidism, secondary (HCC) on their problem list.  Screening Tests Immunization History  Administered Date(s) Administered   DT (Pediatric) 03/21/2012   Influenza, High Dose Seasonal PF 03/27/2019   Influenza-Unspecified 04/16/2014, 04/16/2015, 04/16/2018, 04/18/2021   Moderna Sars-Covid-2 Vaccination 08/07/2019, 09/03/2019, 05/22/2020, 10/27/2020   Pneumococcal Conjugate-13  05/19/2014   Pneumococcal Polysaccharide-23 07/24/2007    Preventative care: Last colonoscopy: 2016 due 07/2021 - Dr. Debrah.   Patient to reach out to office if wanting to schedule  updated Colonoscopy. Evidence indicates that the net benefit of screening all persons in this age group is small. In determining whether this service is appropriate in individual cases, patients and clinicians should consider the patient's overall health, prior screening history, and  preferences.  Prior vaccinations: TD or Tdap: 2013  Influenza: 2023 Pneumococcal: 2009  Prevnar13: 2015 Shingles/Zostavax: declines, cost Covid 19: 2/2 2021  Names of Other Physician/Practitioners you currently use: 1. [facility] here for primary care 2. Dr. Nicholaus in [place] [facility]), UTD:  2023 3. Dr. , dentist, overdue   Patient Care Team: Marcus Fallow, MD as PCP - General (Internal Medicine) Debrah Lamar BIRCH, MD (Inactive) as Consulting Physician (Gastroenterology) Alline Lenis, MD (Inactive) as Consulting Physician (Urology) Gladis Cough, MD as Consulting Physician (General Surgery) Quinn Odor, [facility] (Inactive) as Pharmacist (Pharmacist)  Surgical: He  has a past surgical history that includes Appendectomy (1994); Cholecystectomy (1994); Knee surgery (1996); and colovesical fistula repair (01/2011). Family His family history is not on file. Social history  He reports that he quit smoking about 38 years ago. His smoking use included cigarettes. He has never used smokeless tobacco. He reports that he does not drink alcohol and does not use drugs.  MEDICARE WELLNESS OBJECTIVES: Physical activity:   Cardiac risk factors:   Depression/mood screen:      03/25/2023   10:39 PM  Depression screen PHQ 2/9  Decreased Interest 0  Down, Depressed, Hopeless 0  PHQ - 2 Score 0    ADLs:     03/25/2023   10:38 PM 12/20/2022   10:18 AM  In your present state of health, do you  have any difficulty performing the following activities:  Hearing? 0 0  Vision? 0 0  Difficulty concentrating or making decisions? 0 0  Walking or climbing stairs? 0 0  Dressing or bathing? 0 0  Doing errands, shopping? 0 0     Cognitive Testing  Alert? Yes  Normal Appearance?Yes  Oriented to person? Yes  Place? Yes   Time? Yes  Recall of three objects?  Yes  Can perform simple calculations? Yes  Displays appropriate judgment?Yes  Can read the correct time from a watch face?Yes  EOL planning:     Objective:   There were no vitals filed for this visit.   There is no height or weight on file to calculate BMI.  General appearance: alert, no distress, WD/WN, obese male HEENT: normocephalic, sclerae anicteric, TMs pearly, nares patent, no discharge or erythema, pharynx normal Oral cavity: MMM, no lesions Neck: supple, no lymphadenopathy, no thyromegaly, no masses Heart: Rate regular, skipping every 2-3 beats, normal S1, S2, no murmurs Lungs: CTA bilaterally, no wheezes, rhonchi, or rales Abdomen: +bs, soft, pendulous, non tender, non distended, no masses, no hepatomegaly, no splenomegaly Musculoskeletal: nontender, no swelling, no obvious deformity Extremities: no edema, no cyanosis, no clubbing. Toe nails long, curved, thickened.  Pulses: 2+ symmetric, upper and lower extremities, normal cap refill Neurological: alert, oriented x 3, CN2-12 intact, strength normal upper extremities and lower extremities, sensation normal throughout, DTRs 2+ throughout, no cerebellar signs, gait slow steady Psychiatric: normal affect, behavior normal, pleasant   EKG 02/2021: frequent PAC/PVC's, NSR, no a. Fib -   Medicare Attestation I have personally reviewed: The patient's medical and social history Their use of alcohol, tobacco or illicit drugs Their current medications and supplements The patient's functional ability including ADLs,fall risks, home safety risks, cognitive, and hearing and  visual impairment Diet and physical  activities Evidence for depression or mood disorders  The patient's weight, height, BMI, and visual acuity have been recorded in the chart.  I have made referrals, counseling, and provided education to the patient based on review of the above and I have provided the patient with a written personalized care plan for preventive services.     BASCOM NECESSARY, NP   07/25/2023

## 2023-07-26 ENCOUNTER — Ambulatory Visit: Payer: Medicare PPO | Admitting: Nurse Practitioner

## 2023-07-26 DIAGNOSIS — E559 Vitamin D deficiency, unspecified: Secondary | ICD-10-CM

## 2023-07-26 DIAGNOSIS — Z79899 Other long term (current) drug therapy: Secondary | ICD-10-CM

## 2023-07-26 DIAGNOSIS — Z860100 Personal history of colon polyps, unspecified: Secondary | ICD-10-CM

## 2023-07-26 DIAGNOSIS — Z8546 Personal history of malignant neoplasm of prostate: Secondary | ICD-10-CM

## 2023-07-26 DIAGNOSIS — Z0000 Encounter for general adult medical examination without abnormal findings: Secondary | ICD-10-CM

## 2023-07-26 DIAGNOSIS — I493 Ventricular premature depolarization: Secondary | ICD-10-CM

## 2023-07-26 DIAGNOSIS — E1122 Type 2 diabetes mellitus with diabetic chronic kidney disease: Secondary | ICD-10-CM

## 2023-07-26 DIAGNOSIS — E66811 Obesity, class 1: Secondary | ICD-10-CM

## 2023-07-26 DIAGNOSIS — I10 Essential (primary) hypertension: Secondary | ICD-10-CM

## 2023-07-26 DIAGNOSIS — N138 Other obstructive and reflux uropathy: Secondary | ICD-10-CM

## 2023-07-26 DIAGNOSIS — M1000 Idiopathic gout, unspecified site: Secondary | ICD-10-CM

## 2023-07-26 DIAGNOSIS — E1169 Type 2 diabetes mellitus with other specified complication: Secondary | ICD-10-CM

## 2023-07-26 DIAGNOSIS — N2581 Secondary hyperparathyroidism of renal origin: Secondary | ICD-10-CM

## 2023-08-31 ENCOUNTER — Ambulatory Visit: Payer: Medicare PPO | Admitting: Nurse Practitioner

## 2023-09-17 ENCOUNTER — Other Ambulatory Visit: Payer: Self-pay

## 2023-09-17 DIAGNOSIS — E785 Hyperlipidemia, unspecified: Secondary | ICD-10-CM

## 2023-09-17 MED ORDER — ATORVASTATIN CALCIUM 20 MG PO TABS
ORAL_TABLET | ORAL | 0 refills | Status: AC
Start: 2023-09-17 — End: ?

## 2023-09-20 ENCOUNTER — Encounter: Payer: Self-pay | Admitting: *Deleted

## 2023-10-24 ENCOUNTER — Ambulatory Visit: Payer: No Typology Code available for payment source | Admitting: Internal Medicine

## 2023-12-13 ENCOUNTER — Other Ambulatory Visit: Payer: Self-pay | Admitting: Family

## 2023-12-13 DIAGNOSIS — E785 Hyperlipidemia, unspecified: Secondary | ICD-10-CM

## 2023-12-21 ENCOUNTER — Ambulatory Visit: Payer: Medicare PPO | Admitting: Nurse Practitioner

## 2024-04-02 ENCOUNTER — Encounter: Payer: No Typology Code available for payment source | Admitting: Internal Medicine

## 2024-07-25 ENCOUNTER — Ambulatory Visit: Payer: Medicare PPO | Admitting: Nurse Practitioner

## 2024-10-23 ENCOUNTER — Ambulatory Visit: Payer: Medicare PPO | Admitting: Nurse Practitioner
